# Patient Record
Sex: Female | Born: 1965 | Race: Black or African American | Hispanic: No | Marital: Married | Smoking: Never smoker
Health system: Southern US, Community
[De-identification: ages and names within clinical notes are randomized; demographics above are authoritative.]

## PROBLEM LIST (undated history)

## (undated) DIAGNOSIS — F419 Anxiety disorder, unspecified: Secondary | ICD-10-CM

## (undated) DIAGNOSIS — R001 Bradycardia, unspecified: Secondary | ICD-10-CM

## (undated) DIAGNOSIS — I471 Supraventricular tachycardia, unspecified: Secondary | ICD-10-CM

## (undated) DIAGNOSIS — R519 Headache, unspecified: Secondary | ICD-10-CM

## (undated) DIAGNOSIS — I429 Cardiomyopathy, unspecified: Secondary | ICD-10-CM

## (undated) DIAGNOSIS — I420 Dilated cardiomyopathy: Secondary | ICD-10-CM

## (undated) DIAGNOSIS — Z950 Presence of cardiac pacemaker: Secondary | ICD-10-CM

## (undated) DIAGNOSIS — I495 Sick sinus syndrome: Secondary | ICD-10-CM

## (undated) DIAGNOSIS — I447 Left bundle-branch block, unspecified: Secondary | ICD-10-CM

## (undated) DIAGNOSIS — F410 Panic disorder [episodic paroxysmal anxiety] without agoraphobia: Secondary | ICD-10-CM

## (undated) DIAGNOSIS — R51 Headache: Secondary | ICD-10-CM

## (undated) DIAGNOSIS — I491 Atrial premature depolarization: Secondary | ICD-10-CM

## (undated) DIAGNOSIS — I5020 Unspecified systolic (congestive) heart failure: Secondary | ICD-10-CM

## (undated) DIAGNOSIS — I10 Essential (primary) hypertension: Secondary | ICD-10-CM

## (undated) DIAGNOSIS — I509 Heart failure, unspecified: Secondary | ICD-10-CM

## (undated) HISTORY — DX: Supraventricular tachycardia, unspecified: I47.10

## (undated) HISTORY — DX: Bradycardia, unspecified: R00.1

## (undated) HISTORY — DX: Anxiety disorder, unspecified: F41.9

## (undated) HISTORY — DX: Essential (primary) hypertension: I10

## (undated) HISTORY — DX: Dilated cardiomyopathy: I42.0

## (undated) HISTORY — DX: Left bundle-branch block, unspecified: I44.7

## (undated) HISTORY — DX: Presence of cardiac pacemaker: Z95.0

## (undated) HISTORY — DX: Heart failure, unspecified: I50.9

## (undated) HISTORY — PX: TUBAL LIGATION: SHX77

## (undated) HISTORY — DX: Sick sinus syndrome: I49.5

## (undated) HISTORY — DX: Atrial premature depolarization: I49.1

## (undated) HISTORY — DX: Cardiomyopathy, unspecified: I42.9

## (undated) HISTORY — DX: Unspecified systolic (congestive) heart failure: I50.20

## (undated) HISTORY — DX: Supraventricular tachycardia: I47.1

## (undated) HISTORY — DX: Panic disorder (episodic paroxysmal anxiety): F41.0

---

## 2009-12-14 HISTORY — PX: CARDIAC CATHETERIZATION: SHX172

## 2009-12-26 HISTORY — PX: PACEMAKER INSERTION: SHX728

## 2010-10-14 HISTORY — PX: LEEP: SHX91

## 2012-04-15 ENCOUNTER — Other Ambulatory Visit (HOSPITAL_COMMUNITY)
Admission: RE | Admit: 2012-04-15 | Discharge: 2012-04-15 | Disposition: A | Discharge status: Home / Self Care | Payer: Managed Care, Other (non HMO) | Source: Ambulatory Visit | Attending: Obstetrics and Gynecology | Admitting: Obstetrics and Gynecology

## 2012-04-15 DIAGNOSIS — Z01419 Encounter for gynecological examination (general) (routine) without abnormal findings: Secondary | ICD-10-CM | POA: Insufficient documentation

## 2012-04-15 DIAGNOSIS — Z113 Encounter for screening for infections with a predominantly sexual mode of transmission: Secondary | ICD-10-CM | POA: Insufficient documentation

## 2012-04-26 ENCOUNTER — Other Ambulatory Visit: Payer: Self-pay | Admitting: Obstetrics and Gynecology

## 2012-04-26 DIAGNOSIS — Z1231 Encounter for screening mammogram for malignant neoplasm of breast: Secondary | ICD-10-CM

## 2012-05-16 ENCOUNTER — Ambulatory Visit
Admission: RE | Admit: 2012-05-16 | Discharge: 2012-05-16 | Disposition: A | Discharge status: Home / Self Care | Payer: Managed Care, Other (non HMO) | Source: Ambulatory Visit | Attending: Obstetrics and Gynecology | Admitting: Obstetrics and Gynecology

## 2012-05-16 DIAGNOSIS — Z1231 Encounter for screening mammogram for malignant neoplasm of breast: Secondary | ICD-10-CM

## 2013-05-03 ENCOUNTER — Other Ambulatory Visit (HOSPITAL_COMMUNITY)
Admission: RE | Admit: 2013-05-03 | Discharge: 2013-05-03 | Disposition: A | Discharge status: Home / Self Care | Payer: Commercial Indemnity | Source: Ambulatory Visit | Attending: Obstetrics and Gynecology | Admitting: Obstetrics and Gynecology

## 2013-05-03 ENCOUNTER — Other Ambulatory Visit: Payer: Self-pay | Admitting: Obstetrics and Gynecology

## 2013-05-03 DIAGNOSIS — Z1151 Encounter for screening for human papillomavirus (HPV): Secondary | ICD-10-CM | POA: Insufficient documentation

## 2013-05-03 DIAGNOSIS — Z01419 Encounter for gynecological examination (general) (routine) without abnormal findings: Secondary | ICD-10-CM | POA: Insufficient documentation

## 2013-11-16 ENCOUNTER — Telehealth: Payer: Self-pay | Admitting: Interventional Cardiology

## 2013-11-16 NOTE — Telephone Encounter (Signed)
Left message for patient that transmitter is set up and ready for remote 11/20/13.

## 2013-11-16 NOTE — Telephone Encounter (Signed)
New Problem:  Pt is calling in b/c she recently moved to a different apartment and she wants to know if everything is set up for her remote transsion that is scheduled for Monday. Pt is requesting a call back

## 2013-11-20 ENCOUNTER — Ambulatory Visit: Payer: Commercial Indemnity | Admitting: *Deleted

## 2013-11-20 ENCOUNTER — Encounter: Payer: Self-pay | Admitting: Internal Medicine

## 2013-11-20 DIAGNOSIS — I495 Sick sinus syndrome: Secondary | ICD-10-CM

## 2013-11-27 LAB — MDC_IDC_ENUM_SESS_TYPE_REMOTE
Battery Remaining Longevity: 52 mo
Implantable Pulse Generator Model: 3210
Lead Channel Impedance Value: 440 Ohm
Lead Channel Impedance Value: 580 Ohm
Lead Channel Pacing Threshold Pulse Width: 0.4 ms
Lead Channel Pacing Threshold Pulse Width: 0.4 ms

## 2013-12-01 ENCOUNTER — Encounter: Payer: Self-pay | Admitting: *Deleted

## 2013-12-19 ENCOUNTER — Encounter: Payer: Self-pay | Admitting: Internal Medicine

## 2013-12-19 ENCOUNTER — Ambulatory Visit: Payer: Commercial Indemnity | Admitting: *Deleted

## 2013-12-19 LAB — MDC_IDC_ENUM_SESS_TYPE_REMOTE
Implantable Pulse Generator Model: 3210
Implantable Pulse Generator Serial Number: 2383113
Lead Channel Impedance Value: 590 Ohm
Lead Channel Pacing Threshold Amplitude: 1.5 V
Lead Channel Pacing Threshold Pulse Width: 0.4 ms
Lead Channel Pacing Threshold Pulse Width: 0.4 ms
Lead Channel Setting Pacing Pulse Width: 0.4 ms
Lead Channel Setting Pacing Pulse Width: 0.4 ms
MDC IDC MSMT BATTERY VOLTAGE: 2.9 V
MDC IDC MSMT LEADCHNL LV PACING THRESHOLD AMPLITUDE: 1 V
MDC IDC MSMT LEADCHNL LV PACING THRESHOLD PULSEWIDTH: 0.4 ms
MDC IDC MSMT LEADCHNL RA IMPEDANCE VALUE: 450 Ohm
MDC IDC MSMT LEADCHNL RA PACING THRESHOLD AMPLITUDE: 0.625 V
MDC IDC MSMT LEADCHNL RA SENSING INTR AMPL: 2.9 mV
MDC IDC MSMT LEADCHNL RV IMPEDANCE VALUE: 380 Ohm
MDC IDC SET LEADCHNL LV PACING AMPLITUDE: 2 V
MDC IDC SET LEADCHNL RA PACING AMPLITUDE: 1.625
MDC IDC SET LEADCHNL RV PACING AMPLITUDE: 2.5 V
MDC IDC SET LEADCHNL RV SENSING SENSITIVITY: 2 mV
MDC IDC STAT BRADY RA PERCENT PACED: 8 %
MDC IDC STAT BRADY RV PERCENT PACED: 99 % — AB

## 2013-12-20 ENCOUNTER — Telehealth: Payer: Self-pay

## 2013-12-20 MED ORDER — NEBIVOLOL HCL 10 MG PO TABS: 20.0000 mg | ORAL_TABLET | Freq: Every day | ORAL | Status: DC

## 2013-12-20 NOTE — Telephone Encounter (Signed)
returned pt call.ok for pt to take 2 10mg  tablets daily per Dr.Nelson. RX sent to pt pharmacy.pt verbalized understanding.

## 2013-12-22 ENCOUNTER — Other Ambulatory Visit: Payer: Self-pay

## 2013-12-22 MED ORDER — NEBIVOLOL HCL 10 MG PO TABS: 20.0000 mg | ORAL_TABLET | Freq: Every day | ORAL | Status: DC

## 2013-12-27 ENCOUNTER — Other Ambulatory Visit: Payer: Self-pay

## 2013-12-27 DIAGNOSIS — Z1231 Encounter for screening mammogram for malignant neoplasm of breast: Secondary | ICD-10-CM

## 2014-01-09 ENCOUNTER — Encounter: Payer: Self-pay | Admitting: *Deleted

## 2014-01-19 ENCOUNTER — Ambulatory Visit
Admission: RE | Admit: 2014-01-19 | Discharge: 2014-01-19 | Disposition: A | Discharge status: Home / Self Care | Payer: Managed Care, Other (non HMO) | Source: Ambulatory Visit

## 2014-01-19 DIAGNOSIS — Z1231 Encounter for screening mammogram for malignant neoplasm of breast: Secondary | ICD-10-CM

## 2014-02-20 ENCOUNTER — Encounter: Payer: Commercial Indemnity | Admitting: Internal Medicine

## 2014-05-23 ENCOUNTER — Encounter: Payer: Self-pay | Admitting: Internal Medicine

## 2014-05-23 ENCOUNTER — Ambulatory Visit (INDEPENDENT_AMBULATORY_CARE_PROVIDER_SITE_OTHER): Payer: Managed Care, Other (non HMO) | Admitting: *Deleted

## 2014-05-23 DIAGNOSIS — I509 Heart failure, unspecified: Secondary | ICD-10-CM

## 2014-05-23 LAB — MDC_IDC_ENUM_SESS_TYPE_REMOTE
Battery Remaining Longevity: 56 mo
Brady Statistic AP VP Percent: 7 %
Brady Statistic AS VP Percent: 92 %
Brady Statistic AS VS Percent: 1 %
Brady Statistic RA Percent Paced: 6.9 %
Date Time Interrogation Session: 20150610060016
Implantable Pulse Generator Serial Number: 2383113
Lead Channel Impedance Value: 350 Ohm
Lead Channel Pacing Threshold Amplitude: 0.625 V
Lead Channel Pacing Threshold Pulse Width: 0.4 ms
Lead Channel Pacing Threshold Pulse Width: 0.4 ms
Lead Channel Sensing Intrinsic Amplitude: 1.7 mV
Lead Channel Sensing Intrinsic Amplitude: 12 mV
Lead Channel Setting Pacing Amplitude: 1.625
Lead Channel Setting Pacing Amplitude: 2.375
Lead Channel Setting Pacing Pulse Width: 0.4 ms
Lead Channel Setting Pacing Pulse Width: 0.4 ms
MDC IDC MSMT BATTERY REMAINING PERCENTAGE: 53 %
MDC IDC MSMT BATTERY VOLTAGE: 2.9 V
MDC IDC MSMT LEADCHNL LV IMPEDANCE VALUE: 580 Ohm
MDC IDC MSMT LEADCHNL LV PACING THRESHOLD AMPLITUDE: 1.125 V
MDC IDC MSMT LEADCHNL LV PACING THRESHOLD PULSEWIDTH: 0.4 ms
MDC IDC MSMT LEADCHNL RA IMPEDANCE VALUE: 490 Ohm
MDC IDC MSMT LEADCHNL RV PACING THRESHOLD AMPLITUDE: 1.375 V
MDC IDC SET LEADCHNL LV PACING AMPLITUDE: 2.125
MDC IDC SET LEADCHNL RV SENSING SENSITIVITY: 2 mV
MDC IDC STAT BRADY AP VS PERCENT: 1 %

## 2014-05-23 NOTE — Progress Notes (Signed)
Remote pacemaker transmission.   

## 2014-06-05 ENCOUNTER — Encounter: Payer: Self-pay | Admitting: Cardiology

## 2014-07-19 ENCOUNTER — Encounter: Payer: Managed Care, Other (non HMO) | Admitting: Internal Medicine

## 2014-08-01 ENCOUNTER — Encounter: Payer: Self-pay | Admitting: Internal Medicine

## 2014-08-16 ENCOUNTER — Ambulatory Visit (INDEPENDENT_AMBULATORY_CARE_PROVIDER_SITE_OTHER): Payer: Managed Care, Other (non HMO) | Admitting: Internal Medicine

## 2014-08-16 ENCOUNTER — Encounter: Payer: Self-pay | Admitting: Internal Medicine

## 2014-08-16 ENCOUNTER — Encounter: Payer: Self-pay | Admitting: Interventional Cardiology

## 2014-08-16 ENCOUNTER — Telehealth: Payer: Self-pay | Admitting: Interventional Cardiology

## 2014-08-16 ENCOUNTER — Ambulatory Visit (INDEPENDENT_AMBULATORY_CARE_PROVIDER_SITE_OTHER): Payer: Managed Care, Other (non HMO) | Admitting: Interventional Cardiology

## 2014-08-16 VITALS — BP 122/80 | HR 50 | Ht 66.0 in | Wt 197.0 lb

## 2014-08-16 VITALS — BP 136/80 | HR 55 | Ht 66.0 in | Wt 197.0 lb

## 2014-08-16 DIAGNOSIS — I5022 Chronic systolic (congestive) heart failure: Secondary | ICD-10-CM

## 2014-08-16 DIAGNOSIS — Z950 Presence of cardiac pacemaker: Secondary | ICD-10-CM

## 2014-08-16 DIAGNOSIS — I10 Essential (primary) hypertension: Secondary | ICD-10-CM

## 2014-08-16 LAB — MDC_IDC_ENUM_SESS_TYPE_INCLINIC
Battery Voltage: 2.9 V
Brady Statistic RA Percent Paced: 7.4 %
Implantable Pulse Generator Model: 3210
Lead Channel Impedance Value: 575 Ohm
Lead Channel Pacing Threshold Amplitude: 0.75 V
Lead Channel Pacing Threshold Amplitude: 1.625 V
Lead Channel Pacing Threshold Pulse Width: 0.4 ms
Lead Channel Sensing Intrinsic Amplitude: 12 mV
Lead Channel Setting Pacing Amplitude: 1.75 V
Lead Channel Setting Pacing Amplitude: 2 V
Lead Channel Setting Pacing Amplitude: 2.625
Lead Channel Setting Pacing Pulse Width: 0.4 ms
Lead Channel Setting Sensing Sensitivity: 2 mV
MDC IDC MSMT BATTERY REMAINING LONGEVITY: 62.4 mo
MDC IDC MSMT LEADCHNL LV PACING THRESHOLD AMPLITUDE: 1 V
MDC IDC MSMT LEADCHNL LV PACING THRESHOLD PULSEWIDTH: 0.4 ms
MDC IDC MSMT LEADCHNL RA IMPEDANCE VALUE: 462.5 Ohm
MDC IDC MSMT LEADCHNL RA SENSING INTR AMPL: 1 mV
MDC IDC MSMT LEADCHNL RV IMPEDANCE VALUE: 337.5 Ohm
MDC IDC MSMT LEADCHNL RV PACING THRESHOLD PULSEWIDTH: 0.4 ms
MDC IDC PG SERIAL: 2383113
MDC IDC SESS DTM: 20150903162315
MDC IDC SET LEADCHNL LV PACING PULSEWIDTH: 0.4 ms
MDC IDC STAT BRADY RV PERCENT PACED: 99.34 %

## 2014-08-16 NOTE — Progress Notes (Signed)
Patient ID: Alexis Clay, female   DOB: 06-28-1966, 48 y.o.   MRN: [id number]    [address] [place], [place]  [address] Phone: [phone] Fax:  [phone]  Date:  08/16/2014   ID:  Alexis Clay, DOB January 22, 1966, MRN [id number]  PCP:  No primary provider on file.   ASSESSMENT:  1. 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 Biventricular pacing 3. Hypertension, controlled 4. Left bundle branch block  PLAN:  1. Clinical followup 6-9 months 2. No change in current medical regimen 3. at her next visit we will need to reassess LV function   SUBJECTIVE: Alexis Clay is a 48 y.o. female frustrated because she has been on office for several hours today. She saw Dr. Ladona Ridgel earlier today. She was supposed to see me in 3:30 but was still an EP until 4:30. And I didn't see her until around 5:30. She is asymptomatic. She denies orthopnea, PND, palpitations, syncope, edema, and exertional intolerance. She denies chest pain. No medication side effects. Nebivolol/Bystolic was discontinued last year when the medication was not available and the atenolol was started. She does not want to use a different beta blocker because of the affordability.   Wt Readings from Last 3 Encounters:  08/16/14 197 lb (89.359 kg)  08/16/14 197 lb (89.359 kg)     Past Medical History  Diagnosis Date  . Cardiomyopathy   . Systolic heart failure     Due to hypertension (LVEF 35-55% after med therapy & resynchronizatoin)  . Hypertension, benign   . LBBB (left bundle branch block)   . Biventricular cardiac pacemaker in situ   . SVT (supraventricular tachycardia)   . Sinus node dysfunction   . Anxiety disorder   . Bradycardia   . Panic disorder without agoraphobia   . Supraventricular premature beats   . Sinoatrial node dysfunction   . Dilated cardiomyopathy   . CHF (congestive heart failure)     Current Outpatient Prescriptions    Medication Sig Dispense Refill  . aspirin 81 MG tablet Take 81 mg by mouth daily.      Marland Kitchen atenolol (TENORMIN) 50 MG tablet Take 50 mg by mouth daily.       Marland Kitchen lisinopril-hydrochlorothiazide (PRINZIDE,ZESTORETIC) 20-25 MG per tablet 1 tablet daily.        No current facility-administered medications for this visit.    Allergies:   No Known Allergies  Social History:  The patient  reports that she has never smoked. She does not have any smokeless tobacco history on file. She reports that she drinks alcohol. She reports that she does not use illicit drugs.   ROS:  Please see the history of present illness.   Denies edema. No neurological complaints. No prolonged palpitations.    All other systems reviewed and negative.   OBJECTIVE: VS:  BP 122/80  Pulse 50  Ht  (1.676 m)  Wt 197 lb (89.359 kg)  BMI 31.81 kg/m2 Well nourished, well developed, in no acute distress, occlusion of his stated age  HEENT: normal Neck: JVD flat . Carotid bruit absent   Cardiac:  normal S1, S2; RRR; no murmur Lungs:  clear to auscultation bilaterally, no wheezing, rhonchi or rales Abd: soft, nontender, no hepatomegaly Ext: Edema absent . Pulses 2+  Skin: warm and dry Neuro:  CNs 2-12 intact, no focal abnormalities noted  EKG:  Atrial tracking with ventricular pacing.       Signed, Barry Dienes. B.  Leia Alf, MD 08/16/2014 5:24 PM    [facility] PROBLEM LIST: 1. Prior systolic heart failure with EF 35% improving to 55% no medication therapy and after resynchronization 2. Hypertension 3. PSVT 4. Sinus node dysfunction 5. Absence of coronary disease by catheterization 2011 6. Left bundle branch block 7. Biventricular pacer 2011 patient also has an atrial lead *. Anxiety disorder

## 2014-08-16 NOTE — Progress Notes (Signed)
HPI [name] is referred today for ongoing evaluation and management of her biventricular pacemaker. She is a very pleasant 48 year old woman who is moved from [place]. She has a history of hypertension and hypertensive heart disease, and left bundle branch block, who underwent biventricular pacemaker insertion approximately 4 years ago. She has done well in the interim. Her symptoms are really class I, may be class IIA. She does not have angina, and denies peripheral edema. No syncope. No Known Allergies   Current Outpatient Prescriptions  Medication Sig Dispense Refill  . aspirin 81 MG tablet Take 81 mg by mouth daily.      Marland Kitchen atenolol (TENORMIN) 50 MG tablet Take 50 mg by mouth daily.       Marland Kitchen lisinopril-hydrochlorothiazide (PRINZIDE,ZESTORETIC) 20-25 MG per tablet 1 tablet daily.        No current facility-administered medications for this visit.     Past Medical History  Diagnosis Date  . Cardiomyopathy   . Systolic heart failure     Due to hypertension (LVEF 35-55% after med therapy & resynchronizatoin)  . Hypertension, benign   . LBBB (left bundle branch block)   . Biventricular cardiac pacemaker in situ   . SVT (supraventricular tachycardia)   . Sinus node dysfunction   . Anxiety disorder   . Bradycardia   . Panic disorder without agoraphobia   . Supraventricular premature beats   . Sinoatrial node dysfunction   . Dilated cardiomyopathy   . CHF (congestive heart failure)     ROS:   All systems reviewed and negative except as noted in the HPI.   Past Surgical History  Procedure Laterality Date  . Cardiac catheterization  2011    With widely patent coronary arteries   . Cesarean section  1991  . Leep  10/2010  . Pacemaker insertion  12/26/2009    St. Jude BiV PM.  Model # [id number].  Serial # [id number]       Family History  Problem Relation Age of Onset  . Cancer Mother     cervical  . Heart disease Mother     CABGx3 in 2014  . CAD Father   .  Congestive Heart Failure Father   . Hypertension Father   . Renal Disease Father     CKD, ESRD  . Hypertension Brother   . Hypertension Brother   . Hypertension Sister      History   Social History  . Marital Status: Legally Separated    Spouse Name: N/A    Number of Children: N/A  . Years of Education: N/A   Occupational History  . Not on file.   Social History Main Topics  . Smoking status: Never Smoker   . Smokeless tobacco: Not on file  . Alcohol Use: Yes     Comment: occasionally  . Drug Use: No  . Sexual Activity: Not on file   Other Topics Concern  . Not on file   Social History Narrative  . No narrative on file     BP 136/80  Pulse 55  Ht  (1.676 m)  Wt 197 lb (89.359 kg)  BMI 31.81 kg/m2  Physical Exam:  Well appearing 48 year old woman, NAD HEENT: Unremarkable Neck:  No JVD, no thyromegally Back:  No CVA tenderness Lungs:  Clear with no wheezes, rales, or rhonchi. Well-healed pacemaker incision. HEART:  Regular rate rhythm, no murmurs, no rubs, no clicks Abd:  soft, positive bowel sounds, no organomegally, no  rebound, no guarding Ext:  2 plus pulses, no edema, no cyanosis, no clubbing Skin:  No rashes no nodules Neuro:  CN II through XII intact, motor grossly intact  EKG - normal sinus rhythm, with biventricular pacing.  DEVICE  Normal device function.  See PaceArt for details.   Assess/Plan:

## 2014-08-16 NOTE — Assessment & Plan Note (Signed)
Her chronic systolic heart failure appears to be well compensated. No change in medical therapy today. I will defer additional echocardiography, and heart failure medication changes to her primary cardiologist Dr. Katrinka Blazing.

## 2014-08-16 NOTE — Patient Instructions (Signed)
Your physician recommends that you continue on your current medications as directed. Please refer to the Current Medication list given to you today.  Your physician wants you to follow-up in: 6-9 months with Dr.Smith You will receive a reminder letter in the mail two months in advance. If you don't receive a letter, please call our office to schedule the follow-up appointment.  

## 2014-08-16 NOTE — Patient Instructions (Signed)
Remote monitoring is used to monitor your Pacemaker of ICD from home. This monitoring reduces the number of office visits required to check your device to one time per year. It allows [place] to keep an eye on the functioning of your device to ensure it is working properly. You are scheduled for a device check from home on 11/19/14. You may send your transmission at any time that day. If you have a wireless device, the transmission will be sent automatically. After your physician reviews your transmission, you will receive a postcard with your next transmission date.  Your physician wants you to follow-up in: 1 year with Dr. Ladona Ridgel.   You will receive a reminder letter in the mail two months in advance. If you don't receive a letter, please call our office to schedule the follow-up appointment.

## 2014-08-16 NOTE — Assessment & Plan Note (Signed)
Her blood pressure appears to be well-controlled. She will continue her current medications, and is encouraged to maintain a low-sodium diet.

## 2014-08-16 NOTE — Assessment & Plan Note (Signed)
Her St. Jude biventricular pacemaker is working normally. We'll plan to recheck in several months. 

## 2014-08-17 NOTE — Telephone Encounter (Signed)
Error

## 2014-08-20 ENCOUNTER — Encounter: Payer: Self-pay | Admitting: Internal Medicine

## 2014-08-22 ENCOUNTER — Encounter: Payer: Self-pay | Admitting: Internal Medicine

## 2014-11-19 ENCOUNTER — Ambulatory Visit (INDEPENDENT_AMBULATORY_CARE_PROVIDER_SITE_OTHER): Payer: Managed Care, Other (non HMO) | Admitting: *Deleted

## 2014-11-19 ENCOUNTER — Encounter: Payer: Self-pay | Admitting: Internal Medicine

## 2014-11-19 DIAGNOSIS — Z950 Presence of cardiac pacemaker: Secondary | ICD-10-CM

## 2014-11-19 DIAGNOSIS — I5022 Chronic systolic (congestive) heart failure: Secondary | ICD-10-CM

## 2014-11-19 NOTE — Progress Notes (Signed)
Remote pacemaker transmission.   

## 2014-11-24 LAB — MDC_IDC_ENUM_SESS_TYPE_REMOTE
Battery Remaining Longevity: 50 mo
Battery Remaining Percentage: 48 %
Battery Voltage: 2.89 V
Brady Statistic AP VS Percent: 1 %
Brady Statistic RA Percent Paced: 6 %
Implantable Pulse Generator Model: 3210
Lead Channel Impedance Value: 490 Ohm
Lead Channel Pacing Threshold Amplitude: 1 V
Lead Channel Pacing Threshold Amplitude: 1.5 V
Lead Channel Pacing Threshold Pulse Width: 0.4 ms
Lead Channel Pacing Threshold Pulse Width: 0.4 ms
Lead Channel Sensing Intrinsic Amplitude: 1.1 mV
Lead Channel Setting Pacing Amplitude: 1.75 V
Lead Channel Setting Pacing Amplitude: 2 V
Lead Channel Setting Pacing Pulse Width: 0.4 ms
Lead Channel Setting Pacing Pulse Width: 0.4 ms
Lead Channel Setting Sensing Sensitivity: 2 mV
MDC IDC MSMT LEADCHNL LV IMPEDANCE VALUE: 550 Ohm
MDC IDC MSMT LEADCHNL RA PACING THRESHOLD AMPLITUDE: 0.75 V
MDC IDC MSMT LEADCHNL RV IMPEDANCE VALUE: 330 Ohm
MDC IDC MSMT LEADCHNL RV PACING THRESHOLD PULSEWIDTH: 0.4 ms
MDC IDC PG SERIAL: 2383113
MDC IDC SESS DTM: 20151207070822
MDC IDC SET LEADCHNL RV PACING AMPLITUDE: 2.5 V
MDC IDC STAT BRADY AP VP PERCENT: 6.2 %
MDC IDC STAT BRADY AS VP PERCENT: 94 %
MDC IDC STAT BRADY AS VS PERCENT: 1 %

## 2014-11-30 ENCOUNTER — Encounter: Payer: Self-pay | Admitting: Cardiology

## 2015-02-14 ENCOUNTER — Other Ambulatory Visit: Payer: Self-pay

## 2015-02-14 DIAGNOSIS — Z1231 Encounter for screening mammogram for malignant neoplasm of breast: Secondary | ICD-10-CM

## 2015-02-18 ENCOUNTER — Ambulatory Visit (INDEPENDENT_AMBULATORY_CARE_PROVIDER_SITE_OTHER): Payer: Managed Care, Other (non HMO) | Admitting: *Deleted

## 2015-02-18 DIAGNOSIS — Z950 Presence of cardiac pacemaker: Secondary | ICD-10-CM

## 2015-02-18 LAB — MDC_IDC_ENUM_SESS_TYPE_REMOTE
Battery Remaining Longevity: 44 mo
Battery Remaining Percentage: 43 %
Brady Statistic AP VP Percent: 5.1 %
Brady Statistic RA Percent Paced: 4.9 %
Date Time Interrogation Session: 20160307074305
Implantable Pulse Generator Model: 3210
Implantable Pulse Generator Serial Number: 2383113
Lead Channel Impedance Value: 510 Ohm
Lead Channel Impedance Value: 580 Ohm
Lead Channel Pacing Threshold Amplitude: 1.125 V
Lead Channel Pacing Threshold Amplitude: 1.375 V
Lead Channel Pacing Threshold Pulse Width: 0.4 ms
Lead Channel Pacing Threshold Pulse Width: 0.4 ms
Lead Channel Sensing Intrinsic Amplitude: 12 mV
Lead Channel Setting Pacing Amplitude: 1.75 V
Lead Channel Setting Pacing Pulse Width: 0.4 ms
Lead Channel Setting Sensing Sensitivity: 2 mV
MDC IDC MSMT BATTERY VOLTAGE: 2.87 V
MDC IDC MSMT LEADCHNL RA PACING THRESHOLD AMPLITUDE: 0.75 V
MDC IDC MSMT LEADCHNL RA PACING THRESHOLD PULSEWIDTH: 0.4 ms
MDC IDC MSMT LEADCHNL RA SENSING INTR AMPL: 2.4 mV
MDC IDC MSMT LEADCHNL RV IMPEDANCE VALUE: 300 Ohm
MDC IDC SET LEADCHNL LV PACING AMPLITUDE: 2.125
MDC IDC SET LEADCHNL RV PACING AMPLITUDE: 2.375
MDC IDC SET LEADCHNL RV PACING PULSEWIDTH: 0.4 ms
MDC IDC STAT BRADY AP VS PERCENT: 1 %
MDC IDC STAT BRADY AS VP PERCENT: 95 %
MDC IDC STAT BRADY AS VS PERCENT: 1 %

## 2015-02-18 NOTE — Progress Notes (Signed)
Remote pacemaker transmission.   

## 2015-02-20 ENCOUNTER — Ambulatory Visit
Admission: RE | Admit: 2015-02-20 | Discharge: 2015-02-20 | Disposition: A | Discharge status: Home / Self Care | Payer: Managed Care, Other (non HMO) | Source: Ambulatory Visit

## 2015-02-20 DIAGNOSIS — Z1231 Encounter for screening mammogram for malignant neoplasm of breast: Secondary | ICD-10-CM

## 2015-02-28 ENCOUNTER — Encounter: Payer: Self-pay | Admitting: Cardiology

## 2015-03-06 ENCOUNTER — Encounter: Payer: Self-pay | Admitting: Internal Medicine

## 2015-05-20 ENCOUNTER — Ambulatory Visit (INDEPENDENT_AMBULATORY_CARE_PROVIDER_SITE_OTHER): Payer: Self-pay | Admitting: *Deleted

## 2015-05-20 DIAGNOSIS — I5022 Chronic systolic (congestive) heart failure: Secondary | ICD-10-CM

## 2015-05-20 DIAGNOSIS — Z950 Presence of cardiac pacemaker: Secondary | ICD-10-CM

## 2015-05-20 NOTE — Progress Notes (Signed)
Remote pacemaker transmission.   

## 2015-05-23 LAB — CUP PACEART REMOTE DEVICE CHECK
Battery Remaining Longevity: 39 mo
Battery Remaining Percentage: 38 %
Battery Voltage: 2.86 V
Brady Statistic AS VP Percent: 95 %
Brady Statistic AS VS Percent: 1 %
Brady Statistic RA Percent Paced: 4.7 %
Lead Channel Impedance Value: 490 Ohm
Lead Channel Impedance Value: 610 Ohm
Lead Channel Pacing Threshold Amplitude: 0.75 V
Lead Channel Pacing Threshold Amplitude: 1.25 V
Lead Channel Pacing Threshold Amplitude: 1.375 V
Lead Channel Pacing Threshold Pulse Width: 0.4 ms
Lead Channel Pacing Threshold Pulse Width: 0.4 ms
Lead Channel Sensing Intrinsic Amplitude: 1.5 mV
Lead Channel Sensing Intrinsic Amplitude: 12 mV
Lead Channel Setting Pacing Amplitude: 1.75 V
Lead Channel Setting Pacing Amplitude: 2.375
Lead Channel Setting Pacing Pulse Width: 0.4 ms
Lead Channel Setting Pacing Pulse Width: 0.4 ms
MDC IDC MSMT LEADCHNL RV IMPEDANCE VALUE: 300 Ohm
MDC IDC MSMT LEADCHNL RV PACING THRESHOLD PULSEWIDTH: 0.4 ms
MDC IDC SESS DTM: 20160606061302
MDC IDC SET LEADCHNL LV PACING AMPLITUDE: 2.25 V
MDC IDC SET LEADCHNL RV SENSING SENSITIVITY: 2 mV
MDC IDC STAT BRADY AP VP PERCENT: 4.9 %
MDC IDC STAT BRADY AP VS PERCENT: 1 %
Pulse Gen Model: 3210
Pulse Gen Serial Number: 2383113

## 2015-06-03 ENCOUNTER — Encounter: Payer: Self-pay | Admitting: Cardiology

## 2015-06-05 ENCOUNTER — Encounter: Payer: Self-pay | Admitting: Internal Medicine

## 2015-07-19 ENCOUNTER — Other Ambulatory Visit: Payer: Self-pay | Admitting: Interventional Cardiology

## 2015-07-19 NOTE — Telephone Encounter (Signed)
Please advise on refill. I do not see where Dr Katrinka Blazing has ever refilled this for the patient. Thanks, MI

## 2015-09-03 ENCOUNTER — Encounter: Payer: Self-pay | Admitting: Internal Medicine

## 2015-09-03 ENCOUNTER — Ambulatory Visit (INDEPENDENT_AMBULATORY_CARE_PROVIDER_SITE_OTHER): Payer: BLUE CROSS/BLUE SHIELD | Admitting: Internal Medicine

## 2015-09-03 VITALS — BP 118/78 | HR 50 | Ht 66.0 in | Wt 205.2 lb

## 2015-09-03 DIAGNOSIS — I10 Essential (primary) hypertension: Secondary | ICD-10-CM

## 2015-09-03 DIAGNOSIS — Z950 Presence of cardiac pacemaker: Secondary | ICD-10-CM

## 2015-09-03 DIAGNOSIS — I5022 Chronic systolic (congestive) heart failure: Secondary | ICD-10-CM | POA: Diagnosis not present

## 2015-09-03 LAB — CUP PACEART INCLINIC DEVICE CHECK
Battery Voltage: 2.86 V
Date Time Interrogation Session: 20160920111932
Lead Channel Impedance Value: 287.5 Ohm
Lead Channel Impedance Value: 600 Ohm
Lead Channel Pacing Threshold Amplitude: 0.75 V
Lead Channel Pacing Threshold Amplitude: 1 V
Lead Channel Pacing Threshold Amplitude: 1 V
Lead Channel Pacing Threshold Pulse Width: 0.4 ms
Lead Channel Pacing Threshold Pulse Width: 0.4 ms
Lead Channel Pacing Threshold Pulse Width: 0.4 ms
Lead Channel Pacing Threshold Pulse Width: 0.4 ms
Lead Channel Pacing Threshold Pulse Width: 0.4 ms
Lead Channel Sensing Intrinsic Amplitude: 1.2 mV
Lead Channel Setting Pacing Amplitude: 1.625
Lead Channel Setting Pacing Amplitude: 2 V
Lead Channel Setting Pacing Pulse Width: 0.4 ms
Lead Channel Setting Sensing Sensitivity: 2 mV
MDC IDC MSMT BATTERY REMAINING LONGEVITY: 43.2 mo
MDC IDC MSMT LEADCHNL RA IMPEDANCE VALUE: 537.5 Ohm
MDC IDC MSMT LEADCHNL RA PACING THRESHOLD AMPLITUDE: 0.75 V
MDC IDC MSMT LEADCHNL RV PACING THRESHOLD AMPLITUDE: 1.5 V
MDC IDC MSMT LEADCHNL RV PACING THRESHOLD AMPLITUDE: 1.5 V
MDC IDC MSMT LEADCHNL RV PACING THRESHOLD PULSEWIDTH: 0.4 ms
MDC IDC MSMT LEADCHNL RV SENSING INTR AMPL: 12 mV
MDC IDC SET LEADCHNL RV PACING AMPLITUDE: 2.125
MDC IDC SET LEADCHNL RV PACING PULSEWIDTH: 0.4 ms
MDC IDC STAT BRADY RA PERCENT PACED: 4.6 %
MDC IDC STAT BRADY RV PERCENT PACED: 99.59 %
Pulse Gen Model: 3210
Pulse Gen Serial Number: 2383113

## 2015-09-03 NOTE — Patient Instructions (Signed)
Medication Instructions:  Your physician recommends that you continue on your current medications as directed. Please refer to the Current Medication list given to you today.   Labwork: None ordered  Testing/Procedures: None ordered  Follow-Up: Your physician wants you to follow-up in:  12 months with Dr Taylor You will receive a reminder letter in the mail two months in advance. If you don't receive a letter, please call our office to schedule the follow-up appointment.  Remote monitoring is used to monitor your Pacemaker  from home. This monitoring reduces the number of office visits required to check your device to one time per year. It allows us to keep an eye on the functioning of your device to ensure it is working properly. You are scheduled for a device check from home on 12/03/15. You may send your transmission at any time that day. If you have a wireless device, the transmission will be sent automatically. After your physician reviews your transmission, you will receive a postcard with your next transmission date.     Any Other Special Instructions Will Be Listed Below (If Applicable).   

## 2015-09-03 NOTE — Assessment & Plan Note (Signed)
Her blood pressure is well controlled. She will continue her current meds.  

## 2015-09-03 NOTE — Assessment & Plan Note (Signed)
Her symptoms are class 1. She will continue her current meds.  

## 2015-09-03 NOTE — Progress Notes (Signed)
HPI [name] is referred today for ongoing evaluation and management of her biventricular pacemaker. She is a very pleasant 49 year old woman who is moved from [place]. She has a history of hypertension and hypertensive heart disease, and left bundle branch block, who underwent biventricular pacemaker insertion approximately 4 years ago. She has done well in the interim. Her symptoms are really class I, may be class IIA. She does not have angina, and denies peripheral edema. No syncope. No palpitations. No Known Allergies   Current Outpatient Prescriptions  Medication Sig Dispense Refill  . aspirin 81 MG tablet Take 81 mg by mouth daily.    Marland Kitchen atenolol (TENORMIN) 50 MG tablet Take 50 mg by mouth daily.     Marland Kitchen lisinopril-hydrochlorothiazide (PRINZIDE,ZESTORETIC) 20-25 MG per tablet TAKE 1 TABLET BY MOUTH EVERY DAY 30 tablet 2   No current facility-administered medications for this visit.     Past Medical History  Diagnosis Date  . Cardiomyopathy   . Systolic heart failure     Due to hypertension (LVEF 35-55% after med therapy & resynchronizatoin)  . Hypertension, benign   . LBBB (left bundle branch block)   . Biventricular cardiac pacemaker in situ   . SVT (supraventricular tachycardia)   . Sinus node dysfunction   . Anxiety disorder   . Bradycardia   . Panic disorder without agoraphobia   . Supraventricular premature beats   . Sinoatrial node dysfunction   . Dilated cardiomyopathy   . CHF (congestive heart failure)     ROS:   All systems reviewed and negative except as noted in the HPI.   Past Surgical History  Procedure Laterality Date  . Cardiac catheterization  2011    With widely patent coronary arteries   . Cesarean section  1991  . Leep  10/2010  . Pacemaker insertion  12/26/2009    St. Jude BiV PM.  Model # [id number].  Serial # [id number]       Family History  Problem Relation Age of Onset  . Cancer Mother     cervical  . Heart disease Mother    CABGx3 in 2014  . CAD Father   . Congestive Heart Failure Father   . Hypertension Father   . Renal Disease Father     CKD, ESRD  . Hypertension Brother   . Hypertension Brother   . Hypertension Sister      Social History   Social History  . Marital Status: Legally Separated    Spouse Name: N/A  . Number of Children: N/A  . Years of Education: N/A   Occupational History  . Not on file.   Social History Main Topics  . Smoking status: Never Smoker   . Smokeless tobacco: Not on file  . Alcohol Use: Yes     Comment: occasionally  . Drug Use: No  . Sexual Activity: Not on file   Other Topics Concern  . Not on file   Social History Narrative     BP 118/78 mmHg  Pulse 50  Ht  (1.676 m)  Wt 205 lb 3.2 oz (93.078 kg)  BMI 33.14 kg/m2  LMP 08/15/2015 (Exact Date)  Physical Exam:  Well appearing 49 year old woman, NAD HEENT: Unremarkable Neck:  6 cm JVD, no thyromegally Back:  No CVA tenderness Lungs:  Clear with no wheezes, rales, or rhonchi. Well-healed pacemaker incision. HEART:  Regular rate rhythm, no murmurs, no rubs, no clicks Abd:  soft, positive bowel sounds, no organomegally,  no rebound, no guarding Ext:  2 plus pulses, no edema, no cyanosis, no clubbing Skin:  No rashes no nodules Neuro:  CN II through XII intact, motor grossly intact  EKG - normal sinus rhythm, with biventricular pacing.  DEVICE  Normal device function.  See PaceArt for details.   Assess/Plan:

## 2015-09-03 NOTE — Assessment & Plan Note (Signed)
Her St. Jude device is working normally. Will recheck in several months. 

## 2015-11-13 ENCOUNTER — Encounter: Payer: Self-pay | Admitting: Interventional Cardiology

## 2015-11-13 ENCOUNTER — Ambulatory Visit (INDEPENDENT_AMBULATORY_CARE_PROVIDER_SITE_OTHER): Payer: BLUE CROSS/BLUE SHIELD | Admitting: Interventional Cardiology

## 2015-11-13 VITALS — BP 140/82 | HR 65 | Ht 66.0 in | Wt 213.8 lb

## 2015-11-13 DIAGNOSIS — I10 Essential (primary) hypertension: Secondary | ICD-10-CM

## 2015-11-13 DIAGNOSIS — R0683 Snoring: Secondary | ICD-10-CM

## 2015-11-13 DIAGNOSIS — I5022 Chronic systolic (congestive) heart failure: Secondary | ICD-10-CM

## 2015-11-13 DIAGNOSIS — Z950 Presence of cardiac pacemaker: Secondary | ICD-10-CM | POA: Diagnosis not present

## 2015-11-13 MED ORDER — METOPROLOL SUCCINATE ER 100 MG PO TB24: 100.0000 mg | ORAL_TABLET | Freq: Every day | ORAL | Status: DC

## 2015-11-13 NOTE — Progress Notes (Signed)
Cardiology Office Note   Date:  11/13/2015   ID:  Alexis Clay, DOB 27-Jan-1966, MRN [id number]  PCP:  No PCP Per Patient  Cardiologist:  Lesleigh Noe, MD   Chief Complaint  Patient presents with  . Congestive Heart Failure      History of Present Illness: Alexis Clay is a 49 y.o. female who presents for idiopathic cardiomyopathy with prior severe systolic dysfunction with EF of 35% improved to 55%, hypertension, left bundle branch block, history of PSVT, sinus node dysfunction, and mild obesity.  She denies cardiopulmonary complaints. No chest pain, tachycardia, or syncope. No medication side effects. She was previously on other beta blockers but for some reason was switched to atenolol years ago and therapy has not been redirected to guideline approved therapy. She denies orthopnea, PND, and edema. She is not exercising.    Past Medical History  Diagnosis Date  . Cardiomyopathy (HCC)   . Systolic heart failure (HCC)     Due to hypertension (LVEF 35-55% after med therapy & resynchronizatoin)  . Hypertension, benign   . LBBB (left bundle branch block)   . Biventricular cardiac pacemaker in situ   . SVT (supraventricular tachycardia) (HCC)   . Sinus node dysfunction (HCC)   . Anxiety disorder   . Bradycardia   . Panic disorder without agoraphobia   . Supraventricular premature beats   . Sinoatrial node dysfunction (HCC)   . Dilated cardiomyopathy (HCC)   . CHF (congestive heart failure) [facility])     Past Surgical History  Procedure Laterality Date  . Cardiac catheterization  2011    With widely patent coronary arteries   . Cesarean section  1991  . Leep  10/2010  . Pacemaker insertion  12/26/2009    St. Jude BiV PM.  Model # [id number].  Serial # [id number]       Current Outpatient Prescriptions  Medication Sig Dispense Refill  . aspirin 81 MG tablet Take 81 mg by mouth daily.    Marland Kitchen atenolol (TENORMIN) 50 MG tablet Take 50 mg by mouth daily.     Marland Kitchen  lisinopril-hydrochlorothiazide (PRINZIDE,ZESTORETIC) 20-25 MG per tablet TAKE 1 TABLET BY MOUTH EVERY DAY 30 tablet 2   No current facility-administered medications for this visit.    Allergies:   Review of patient's allergies indicates no known allergies.    Social History:  The patient  reports that she has never smoked. She does not have any smokeless tobacco history on file. She reports that she drinks alcohol. She reports that she does not use illicit drugs.   Family History:  The patient's family history includes CAD in her father; Cancer in her mother; Congestive Heart Failure in her father; Heart disease in her mother; Hypertension in her brother, brother, father, and sister; Renal Disease in her father.    ROS:  Please see the history of present illness.   Otherwise, review of systems are positive for loud snoring and complaints from family especially over the recent holiday season. Excessive daytime fatigue..   All other systems are reviewed and negative.    PHYSICAL EXAM: VS:  BP 140/82 mmHg  Pulse 65  Ht  (1.676 m)  Wt 213 lb 12.8 oz (96.979 kg)  BMI 34.52 kg/m2  SpO2 98% , BMI Body mass index is 34.52 kg/(m^2). GEN: Well nourished, well developed, in no acute distress HEENT: normal Neck: no JVD, carotid bruits, or masses Cardiac: RRR.  There is no murmur, rub, or gallop. There is  no edema. Respiratory:  clear to auscultation bilaterally, normal work of breathing. GI: soft, nontender, nondistended, + BS MS: no deformity or atrophy Skin: warm and dry, no rash Neuro:  Strength and sensation are intact Psych: euthymic mood, full affect   EKG:  EKG is not ordered today.    Recent Labs: No results found for requested labs within last 365 days.    Lipid Panel No results found for: CHOL, TRIG, HDL, CHOLHDL, VLDL, LDLCALC, LDLDIRECT    Wt Readings from Last 3 Encounters:  11/13/15 213 lb 12.8 oz (96.979 kg)  09/03/15 205 lb 3.2 oz (93.078 kg)  08/16/14 197  lb (89.359 kg)      Other studies Reviewed: Additional studies/ records that were reviewed today include: none. The findings include none. We have never done blood work in this system..    ASSESSMENT AND PLAN:  1. Biventricular cardiac pacemaker in situ Recent evaluation without evidence of dysfunction  2. Chronic systolic heart failure (HCC) Idiopathic chronic systolic heart failure with recovery of LV function by echo back to greater than 50% EF. No recent study  3. Essential hypertension Mild blood pressure elevation  4. Snoring Rule out sleep apnea.  Current medicines are reviewed at length with the patient today.  The patient has the following concerns regarding medicines: Atenolol is not a guideline mandated beta blocker and heart failure..  The following changes/actions have been instituted:    Change atenolol to metoprolol succinate 100 mg daily  2 Doppler echocardiogram  Sleep study  Increase aerobic activity  Basic metabolic panel  Labs/ tests ordered today include:  No orders of the defined types were placed in this encounter.     Disposition:   FU with HS in 1 year  Signed, Lesleigh NoeSMITH III,Miri Jose W, MD  11/13/2015 8:12 AM    [facility] [address], [place], [place]  [address] Phone: [phone]; Fax: [phone]

## 2015-11-13 NOTE — Patient Instructions (Signed)
Medication Instructions:  Your physician has recommended you make the following change in your medication:  1) STOP Atenolol 2) START Metoprolol 100mg  daily. An Rx has been sent to your pharmacy   Labwork: Your physician recommends that you return for lab work in: 2-4 weeks ([facility])   Testing/Procedures: Your physician has requested that you have an echocardiogram. Echocardiography is a painless test that uses sound waves to create images of your heart. It provides your doctor with information about the size and shape of your heart and how well your heart's chambers and valves are working. This procedure takes approximately one hour. There are no restrictions for this procedure.  Your physician has recommended that you have a sleep study. This test records several body functions during sleep, including: brain activity, eye movement, oxygen and carbon dioxide blood levels, heart rate and rhythm, breathing rate and rhythm, the flow of air through your mouth and nose, snoring, body muscle movements, and chest and belly movement.    Follow-Up: Your physician recommends that you schedule a follow-up appointment in: 2-4 weeks with the Blood Pressure clinic  Your physician wants you to follow-up in: 1 year with Dr.Smith You will receive a reminder letter in the mail two months in advance. If you don't receive a letter, please call our office to schedule the follow-up appointment.    Any Other Special Instructions Will Be Listed Below (If Applicable).     If you need a refill on your cardiac medications before your next appointment, please call your pharmacy.

## 2015-11-29 ENCOUNTER — Ambulatory Visit: Payer: BLUE CROSS/BLUE SHIELD | Admitting: Pharmacist

## 2015-11-29 ENCOUNTER — Other Ambulatory Visit: Payer: BLUE CROSS/BLUE SHIELD

## 2015-11-29 ENCOUNTER — Other Ambulatory Visit (HOSPITAL_COMMUNITY): Payer: BLUE CROSS/BLUE SHIELD

## 2015-12-01 ENCOUNTER — Other Ambulatory Visit: Payer: Self-pay | Admitting: Interventional Cardiology

## 2015-12-03 ENCOUNTER — Ambulatory Visit (INDEPENDENT_AMBULATORY_CARE_PROVIDER_SITE_OTHER): Payer: BLUE CROSS/BLUE SHIELD | Admitting: *Deleted

## 2015-12-03 DIAGNOSIS — Z950 Presence of cardiac pacemaker: Secondary | ICD-10-CM | POA: Diagnosis not present

## 2015-12-03 DIAGNOSIS — I5022 Chronic systolic (congestive) heart failure: Secondary | ICD-10-CM

## 2015-12-03 NOTE — Progress Notes (Signed)
Remote pacemaker transmission.   

## 2015-12-06 ENCOUNTER — Encounter: Payer: Self-pay | Admitting: Cardiology

## 2015-12-06 LAB — CUP PACEART REMOTE DEVICE CHECK
Battery Remaining Percentage: 35 %
Battery Voltage: 2.83 V
Brady Statistic AP VS Percent: 1 %
Brady Statistic AS VP Percent: 98 %
Brady Statistic AS VS Percent: 1 %
Brady Statistic RA Percent Paced: 1.8 %
Date Time Interrogation Session: 20161220072733
Implantable Lead Implant Date: 20110113
Implantable Lead Implant Date: 20110113
Implantable Lead Location: 753858
Implantable Lead Location: 753859
Implantable Lead Location: 753860
Lead Channel Impedance Value: 280 Ohm
Lead Channel Pacing Threshold Amplitude: 0.75 V
Lead Channel Pacing Threshold Amplitude: 1 V
Lead Channel Pacing Threshold Pulse Width: 0.4 ms
Lead Channel Pacing Threshold Pulse Width: 0.4 ms
Lead Channel Sensing Intrinsic Amplitude: 2.9 mV
Lead Channel Setting Pacing Amplitude: 2.375
Lead Channel Setting Pacing Pulse Width: 0.4 ms
Lead Channel Setting Sensing Sensitivity: 2 mV
MDC IDC LEAD IMPLANT DT: 20110113
MDC IDC MSMT BATTERY REMAINING LONGEVITY: 34 mo
MDC IDC MSMT LEADCHNL LV IMPEDANCE VALUE: 550 Ohm
MDC IDC MSMT LEADCHNL RA IMPEDANCE VALUE: 460 Ohm
MDC IDC MSMT LEADCHNL RV PACING THRESHOLD AMPLITUDE: 1.375 V
MDC IDC MSMT LEADCHNL RV PACING THRESHOLD PULSEWIDTH: 0.4 ms
MDC IDC SET LEADCHNL LV PACING AMPLITUDE: 2 V
MDC IDC SET LEADCHNL RA PACING AMPLITUDE: 1.75 V
MDC IDC SET LEADCHNL RV PACING PULSEWIDTH: 0.4 ms
MDC IDC STAT BRADY AP VP PERCENT: 1.9 %
Pulse Gen Model: 3210
Pulse Gen Serial Number: 2383113

## 2015-12-11 NOTE — Addendum Note (Signed)
Addended by: Friend Dorfman K on: 12/11/2015 04:06 PM   Modules accepted: Orders  

## 2015-12-12 ENCOUNTER — Ambulatory Visit (INDEPENDENT_AMBULATORY_CARE_PROVIDER_SITE_OTHER): Payer: BLUE CROSS/BLUE SHIELD | Admitting: Pharmacist

## 2015-12-12 ENCOUNTER — Other Ambulatory Visit (INDEPENDENT_AMBULATORY_CARE_PROVIDER_SITE_OTHER): Payer: BLUE CROSS/BLUE SHIELD | Admitting: *Deleted

## 2015-12-12 ENCOUNTER — Ambulatory Visit (HOSPITAL_COMMUNITY): Payer: BLUE CROSS/BLUE SHIELD | Attending: Cardiovascular Disease

## 2015-12-12 ENCOUNTER — Other Ambulatory Visit: Payer: Self-pay

## 2015-12-12 VITALS — BP 124/78

## 2015-12-12 DIAGNOSIS — I313 Pericardial effusion (noninflammatory): Secondary | ICD-10-CM | POA: Insufficient documentation

## 2015-12-12 DIAGNOSIS — I10 Essential (primary) hypertension: Secondary | ICD-10-CM | POA: Diagnosis not present

## 2015-12-12 DIAGNOSIS — I5022 Chronic systolic (congestive) heart failure: Secondary | ICD-10-CM | POA: Insufficient documentation

## 2015-12-12 DIAGNOSIS — I517 Cardiomegaly: Secondary | ICD-10-CM | POA: Insufficient documentation

## 2015-12-12 LAB — BASIC METABOLIC PANEL
BUN: 14 mg/dL (ref 7–25)
CO2: 26 mmol/L (ref 20–31)
Calcium: 9.6 mg/dL (ref 8.6–10.4)
Chloride: 99 mmol/L (ref 98–110)
Creat: 1.01 mg/dL (ref 0.50–1.05)
GLUCOSE: 103 mg/dL — AB (ref 65–99)
POTASSIUM: 4.1 mmol/L (ref 3.5–5.3)
Sodium: 136 mmol/L (ref 135–146)

## 2015-12-12 NOTE — Patient Instructions (Signed)
Your blood pressure was good today. Continue to monitor at home and call the clinic if you notice readings greater than 140/90.   Please follow-up with Dr. Katrinka BlazingSmith next year.

## 2015-12-12 NOTE — Progress Notes (Signed)
Patient ID: Alexis Clay    DOB: 2066/11/17                MRN:  [id number]     HPI: Alexis Clay is a 49 y.o. female referred by Dr. Katrinka BlazingSmith to HTN clinic.  Patient recently changed from atenolol to metoprolol approximately a month ago. She states she has been doing well on the new medication.  Current HTN meds:  Metoprolol XL 100mg  - one tablet daily Lisinopril/HCTZ 20/25mg  - one tablet daily  BP goal: <140/90  Home BP readings:  She does have an automatic cuff at home and checks her blood pressure about once a week. Her daughter, who is a Engineer, civil (consulting)nurse, also checks it. She is unable to recall the readings, but states that they have all fallen in the normal range.   Wt Readings from Last 3 Encounters:  11/13/15 213 lb 12.8 oz (96.979 kg)  09/03/15 205 lb 3.2 oz (93.078 kg)  08/16/14 197 lb (89.359 kg)   BP Readings from Last 3 Encounters:  11/13/15 140/82  09/03/15 118/78  08/16/14 136/80   Pulse Readings from Last 3 Encounters:  11/13/15 65  09/03/15 50  08/16/14 55    Renal function: CrCl cannot be calculated (Unknown ideal weight.).  Past Medical History  Diagnosis Date  . Cardiomyopathy (HCC)   . Systolic heart failure (HCC)     Due to hypertension (LVEF 35-55% after med therapy & resynchronizatoin)  . Hypertension, benign   . LBBB (left bundle branch block)   . Biventricular cardiac pacemaker in situ   . SVT (supraventricular tachycardia) (HCC)   . Sinus node dysfunction (HCC)   . Anxiety disorder   . Bradycardia   . Panic disorder without agoraphobia   . Supraventricular premature beats   . Sinoatrial node dysfunction (HCC)   . Dilated cardiomyopathy (HCC)   . CHF (congestive heart failure) (HCC)     Current Outpatient Prescriptions on File Prior to Visit  Medication Sig Dispense Refill  . aspirin 81 MG tablet Take 81 mg by mouth daily.    Marland Kitchen. lisinopril-hydrochlorothiazide (PRINZIDE,ZESTORETIC) 20-25 MG tablet TAKE 1 TABLET BY MOUTH EVERY DAY 30 tablet 10    . metoprolol succinate (TOPROL-XL) 100 MG 24 hr tablet Take 1 tablet (100 mg total) by mouth daily. Take with or immediately following a meal. 90 tablet 3   No current facility-administered medications on file prior to visit.    No Known Allergies   Assessment/Plan: Hypertension: She is at goal and doing well on the medication. Encouraged her to continue monitoring her blood pressure and call the clinic if she noticed numbers greater than 140/90. Plan to have her follow-up with Dr. Katrinka BlazingSmith at yearly appointment since she is doing well.

## 2015-12-13 ENCOUNTER — Ambulatory Visit: Payer: BLUE CROSS/BLUE SHIELD | Admitting: Pharmacist

## 2015-12-17 ENCOUNTER — Telehealth: Payer: Self-pay | Admitting: Interventional Cardiology

## 2015-12-17 NOTE — Telephone Encounter (Signed)
New message  ° ° °Patient calling for test results.   °

## 2015-12-17 NOTE — Telephone Encounter (Signed)
Pt aware of lab and echo results.  Bmet was normal  Echo results, heart size and function remain normal. Reassuring. Need to maintain medical regimen and aerobic activity.

## 2015-12-17 NOTE — Telephone Encounter (Signed)
-----   Message from Lyn RecordsHenry W Smith, MD sent at 12/13/2015 12:30 PM EST ----- Let her know heart size and function remain normal. Reassuring. Need to maintain medical regimen and aerobic activity.

## 2016-01-08 ENCOUNTER — Encounter (HOSPITAL_BASED_OUTPATIENT_CLINIC_OR_DEPARTMENT_OTHER): Payer: BLUE CROSS/BLUE SHIELD

## 2016-01-09 ENCOUNTER — Telehealth: Payer: Self-pay

## 2016-01-09 NOTE — Telephone Encounter (Signed)
Cardiac clearance placed in MR nurse fax box to be faxed to Dr.Mann/Ashley fax #[phone]

## 2016-01-15 ENCOUNTER — Other Ambulatory Visit: Payer: Self-pay | Admitting: Gastroenterology

## 2016-01-23 ENCOUNTER — Encounter (HOSPITAL_COMMUNITY): Payer: Self-pay | Admitting: *Deleted

## 2016-02-04 ENCOUNTER — Ambulatory Visit (HOSPITAL_COMMUNITY): Payer: BLUE CROSS/BLUE SHIELD | Admitting: Anesthesiology

## 2016-02-04 ENCOUNTER — Ambulatory Visit (HOSPITAL_COMMUNITY)
Admission: RE | Admit: 2016-02-04 | Discharge: 2016-02-04 | Disposition: A | Discharge status: Home / Self Care | Payer: BLUE CROSS/BLUE SHIELD | Source: Ambulatory Visit | Attending: Gastroenterology | Admitting: Gastroenterology

## 2016-02-04 ENCOUNTER — Encounter (HOSPITAL_COMMUNITY)
Admission: RE | Disposition: A | Discharge status: Home / Self Care | Payer: Self-pay | Source: Ambulatory Visit | Attending: Gastroenterology

## 2016-02-04 ENCOUNTER — Encounter (HOSPITAL_COMMUNITY): Payer: Self-pay

## 2016-02-04 DIAGNOSIS — I420 Dilated cardiomyopathy: Secondary | ICD-10-CM | POA: Insufficient documentation

## 2016-02-04 DIAGNOSIS — Z950 Presence of cardiac pacemaker: Secondary | ICD-10-CM | POA: Insufficient documentation

## 2016-02-04 DIAGNOSIS — I5020 Unspecified systolic (congestive) heart failure: Secondary | ICD-10-CM | POA: Insufficient documentation

## 2016-02-04 DIAGNOSIS — Z79899 Other long term (current) drug therapy: Secondary | ICD-10-CM | POA: Diagnosis not present

## 2016-02-04 DIAGNOSIS — Z7982 Long term (current) use of aspirin: Secondary | ICD-10-CM | POA: Insufficient documentation

## 2016-02-04 DIAGNOSIS — E669 Obesity, unspecified: Secondary | ICD-10-CM | POA: Diagnosis not present

## 2016-02-04 DIAGNOSIS — Z1211 Encounter for screening for malignant neoplasm of colon: Secondary | ICD-10-CM | POA: Diagnosis not present

## 2016-02-04 DIAGNOSIS — Z6834 Body mass index (BMI) 34.0-34.9, adult: Secondary | ICD-10-CM | POA: Insufficient documentation

## 2016-02-04 DIAGNOSIS — I110 Hypertensive heart disease with heart failure: Secondary | ICD-10-CM | POA: Insufficient documentation

## 2016-02-04 HISTORY — DX: Headache, unspecified: R51.9

## 2016-02-04 HISTORY — DX: Presence of cardiac pacemaker: Z95.0

## 2016-02-04 HISTORY — PX: COLONOSCOPY WITH PROPOFOL: SHX5780

## 2016-02-04 HISTORY — DX: Headache: R51

## 2016-02-04 SURGERY — COLONOSCOPY WITH PROPOFOL
Anesthesia: Monitor Anesthesia Care

## 2016-02-04 MED ORDER — LIDOCAINE HCL (CARDIAC) 20 MG/ML IV SOLN
INTRAVENOUS | Status: AC
  Filled 2016-02-04: qty 5

## 2016-02-04 MED ORDER — GLYCOPYRROLATE 0.2 MG/ML IJ SOLN
INTRAMUSCULAR | Status: AC
  Filled 2016-02-04: qty 1

## 2016-02-04 MED ORDER — SODIUM CHLORIDE 0.9 % IJ SOLN
INTRAMUSCULAR | Status: AC
  Filled 2016-02-04: qty 10

## 2016-02-04 MED ORDER — PROPOFOL 500 MG/50ML IV EMUL
INTRAVENOUS | Status: DC | PRN
  Administered 2016-02-04: 40 mg via INTRAVENOUS
  Administered 2016-02-04: 20 mg via INTRAVENOUS

## 2016-02-04 MED ORDER — SODIUM CHLORIDE 0.9 % IV SOLN: INTRAVENOUS | Status: DC

## 2016-02-04 MED ORDER — ONDANSETRON HCL 4 MG/2ML IJ SOLN
INTRAMUSCULAR | Status: AC
  Filled 2016-02-04: qty 2

## 2016-02-04 MED ORDER — EPHEDRINE SULFATE 50 MG/ML IJ SOLN
INTRAMUSCULAR | Status: AC
  Filled 2016-02-04: qty 2

## 2016-02-04 MED ORDER — PROPOFOL 500 MG/50ML IV EMUL
INTRAVENOUS | Status: DC | PRN
  Administered 2016-02-04: 150 ug/kg/min via INTRAVENOUS

## 2016-02-04 MED ORDER — LACTATED RINGERS IV SOLN
INTRAVENOUS | Status: DC
  Administered 2016-02-04: 07:00:00 via INTRAVENOUS

## 2016-02-04 MED ORDER — PHENYLEPHRINE 40 MCG/ML (10ML) SYRINGE FOR IV PUSH (FOR BLOOD PRESSURE SUPPORT)
PREFILLED_SYRINGE | INTRAVENOUS | Status: AC
  Filled 2016-02-04: qty 10

## 2016-02-04 MED ORDER — PROPOFOL 10 MG/ML IV BOLUS
INTRAVENOUS | Status: AC
  Filled 2016-02-04: qty 60

## 2016-02-04 SURGICAL SUPPLY — 21 items

## 2016-02-04 NOTE — Discharge Instructions (Signed)

## 2016-02-04 NOTE — Anesthesia Postprocedure Evaluation (Signed)
Anesthesia Post Note  Patient: Alexis Clay  Procedure(s) Performed: Procedure(s) (LRB): COLONOSCOPY WITH PROPOFOL (N/A)  Patient location during evaluation: PACU Anesthesia Type: MAC Level of consciousness: awake and alert Pain management: pain level controlled Vital Signs Assessment: post-procedure vital signs reviewed and stable Respiratory status: spontaneous breathing, nonlabored ventilation, respiratory function stable and patient connected to nasal cannula oxygen Cardiovascular status: stable and blood pressure returned to baseline Anesthetic complications: no    Last Vitals:  Filed Vitals:   02/04/16 0800 02/04/16 0810  BP: 166/123 183/97  Pulse: 60 57  Temp:    Resp: 15 10    Last Pain: There were no vitals filed for this visit.               Jin Shockley S

## 2016-02-04 NOTE — Op Note (Signed)
[facility] [address] [place], [address]   OPERATIVE PROCEDURE REPORT  PATIENT: Alexis, Clay  MR#: [id number] BIRTHDATE: 1966/05/17 GENDER: female ENDOSCOPIST: Lorenza Burton, MD ASSISTANT:   Arlee Muslim, technician & Dwain Sarna, RN PROCEDURE DATE: 02/26/16 PRE-PROCEDURE PREPARATION: Patient fasted for 4 hours prior to procedure. The patient was prepped with a gallon of Golytely the night prior to the procedure. PRE-PROCEDURE PHYSICAL: Patient has stable vital signs.  Neck is supple.  There is no JVD, thyromegaly or LAD.  Chest clear to auscultation.  S1 and S2 regular.  Abdomen soft, non-distended, non-tender with NABS. PROCEDURE:     Colonoscopy, diagnostic ASA CLASS:     Class III INDICATIONS:     1.  Colorectal cancer screening-average risk patient for colon cancer. MEDICATIONS:     Monitored anesthesia care  DESCRIPTION OF PROCEDURE:  After the risks, benefits, and alternatives of the procedure were thoroughly explained [including a 10% missed rate of cancer and polyps], informed consent was obtained. Digital rectal exam was performed. The Pentax video colonoscope [id number]  was introduced through the anus  and advanced to the terminal ileum which was intubated for a short distance , limited by No adverse events experienced. The quality of the prep was adequate . Multiple washes were done. Small lesions could be missed. The instrument was then slowly withdrawn as the colon was fully examined. Estimated blood loss is zero unless otherwise noted in this procedure report.     COLON FINDINGS: A normal appearing cecum, ileocecal valve, and appendiceal orifice were identified.  The ascending, transverse, descending, sigmoid colon, and rectum appeared unremarkable. The examined terminal ileum appeared to be normal.   The entire colonic mucosa appeared healthy with a normal vascular pattern.  No masses, polyps, diverticula or AVMs were noted.   The appendiceal orifice and the ICV were identified and photographed.  The terminal ileum appeared normal.  Retroflexed views revealed no abnormalities.  The patient tolerated the procedure without immediate complications. The scope was then withdrawn from the patient and the procedure terminated.  TIME TO CECUM:  5 minutes 00 seconds WITHDRAW TIME:  6 minutes 00 seconds  IMPRESSION:     1.  Normal colonoscopy 2.  The examined terminal ileum appeared to be normal  RECOMMENDATIONS:     1.  Continue current medications 2.  Continue surveillance 3.  High fiber diet with liberal fluid intake. 4.  OP follow-up is advised on a PRN basis.  REPEAT EXAM:      In 10 years  for a repeat colonoscopy. If the patient has any abnormal GI symptoms in the interim, she have been advised to contact the office as soon as possible for further recommendations.   REFERRED WN:UUVOZ Morris, MD Verdis Prime, MD eSigned:  Lorenza Burton, MD 26-Feb-2016 7:48 AM  CPT CODES:     [phone] Colonoscopy, flexible, proximal to splenic flexure; diagnostic, with or without collection of specimen(s) by brushing or washing, with or without colon decompression (separate procedure) ICD CODES:     Z12.11 Encounter for screening for malignant neoplasm of colon  The ICD and CPT codes recommended by this software are interpretations from the data that the clinical staff has captured with the software.  The verification of the translation of this report to the ICD and CPT codes and modifiers is the sole responsibility of the health care institution and practicing physician where this report was generated.  [employer] will not be held responsible for  the validity of the ICD and CPT codes included on this report.  AMA assumes no liability for data contained or not contained herein. CPT is a Publishing rights manager of the [employer].  PATIENT NAME:  Alexis, Clay MR#: [id number]

## 2016-02-04 NOTE — H&P (Signed)
Alexis Clay is an 50 y.o. female.   Chief Complaint: Colorectal cancer screening. HPI: 50 year old black female here for a screening colonoscopy. See office notes for details.   Past Medical History  Diagnosis Date  . Cardiomyopathy (HCC)   . Systolic heart failure (HCC)     Due to hypertension (LVEF 35-55% after med therapy & resynchronizatoin)  . Hypertension, benign   . LBBB (left bundle branch block)   . Biventricular cardiac pacemaker in situ   . SVT (supraventricular tachycardia) (HCC)   . Sinus node dysfunction (HCC)   . Anxiety disorder   . Bradycardia   . Panic disorder without agoraphobia   . Supraventricular premature beats   . Sinoatrial node dysfunction (HCC)   . Dilated cardiomyopathy (HCC)   . CHF (congestive heart failure) (HCC)   . Presence of permanent cardiac pacemaker   . Headache    Past Surgical History  Procedure Laterality Date  . Cardiac catheterization  2011    With widely patent coronary arteries   . Cesarean section  1991  . Leep  10/2010  . Pacemaker insertion  12/26/2009    St. Jude BiV PM.  Model # [id number].  Serial # [id number]    . Tubal ligation     Family History  Problem Relation Age of Onset  . Cancer Mother     cervical  . Heart disease Mother     CABGx3 in 2014  . CAD Father   . Congestive Heart Failure Father   . Hypertension Father   . Renal Disease Father     CKD, ESRD  . Hypertension Brother   . Hypertension Brother   . Hypertension Sister    Social History:  reports that she has never smoked. She does not have any smokeless tobacco history on file. She reports that she drinks alcohol. She reports that she does not use illicit drugs.  Allergies: No Known Allergies  Medications Prior to Admission  Medication Sig Dispense Refill  . aspirin 81 MG tablet Take 81 mg by mouth daily.    Marland Kitchen lisinopril-hydrochlorothiazide (PRINZIDE,ZESTORETIC) 20-25 MG tablet TAKE 1 TABLET BY MOUTH EVERY DAY 30 tablet 10  . metoprolol succinate  (TOPROL-XL) 100 MG 24 hr tablet Take 1 tablet (100 mg total) by mouth daily. Take with or immediately following a meal. 90 tablet 3  . OVER THE COUNTER MEDICATION Take 1 tablet by mouth daily. Multivitamin w/ lactobacillus     Review of Systems  Constitutional: Negative.   HENT: Negative.   Eyes: Negative.   Respiratory: Negative.   Cardiovascular: Negative.   Gastrointestinal: Positive for heartburn.  Genitourinary: Negative.   Musculoskeletal: Negative.   Skin: Negative.   Neurological: Negative.   Endo/Heme/Allergies: Negative.   Psychiatric/Behavioral: Negative.    Blood pressure 156/77, pulse 75, temperature 98 F (36.7 C), temperature source Oral, resp. rate 17, height  (1.676 m), weight 96.616 kg (213 lb), last menstrual period 01/15/2016, SpO2 100 %. Physical Exam  Constitutional: She is oriented to person, place, and time. She appears well-developed and well-nourished.  HENT:  Head: Normocephalic and atraumatic.  Eyes: Conjunctivae and EOM are normal. Pupils are equal, round, and reactive to light.  Neck: Normal range of motion. Neck supple.  Cardiovascular: Normal rate and regular rhythm.   Respiratory: Effort normal and breath sounds normal.  GI: Soft. Bowel sounds are normal.  Musculoskeletal: Normal range of motion.  Neurological: She is alert and oriented to person, place, and time.  Skin: Skin  is warm and dry.  Psychiatric: She has a normal mood and affect. Her behavior is normal. Judgment and thought content normal.    Assessment/Plan Colorectal cancer screening: proceed with a colonoscopy at this time.  Brentley Landfair, MD 02/04/2016, 7:16 AM

## 2016-02-04 NOTE — Anesthesia Preprocedure Evaluation (Signed)
Anesthesia Evaluation  Patient identified by MRN, date of birth, ID band Patient awake    Reviewed: Allergy & Precautions, NPO status , Patient's Chart, lab work & pertinent test results  Airway Mallampati: II   Neck ROM: full    Dental   Pulmonary neg pulmonary ROS,    breath sounds clear to auscultation       Cardiovascular hypertension, +CHF  + dysrhythmias + pacemaker  Rhythm:regular Rate:Normal  EF 35%   Neuro/Psych  Headaches,    GI/Hepatic   Endo/Other  obese  Renal/GU      Musculoskeletal   Abdominal   Peds  Hematology   Anesthesia Other Findings   Reproductive/Obstetrics                             Anesthesia Physical Anesthesia Plan  ASA: III  Anesthesia Plan: MAC   Post-op Pain Management:    Induction: Intravenous  Airway Management Planned: Simple Face Mask  Additional Equipment:   Intra-op Plan:   Post-operative Plan:   Informed Consent: I have reviewed the patients History and Physical, chart, labs and discussed the procedure including the risks, benefits and alternatives for the proposed anesthesia with the patient or authorized representative who has indicated his/her understanding and acceptance.     Plan Discussed with: CRNA, Anesthesiologist and Surgeon  Anesthesia Plan Comments:         Anesthesia Quick Evaluation

## 2016-02-04 NOTE — Transfer of Care (Signed)
Immediate Anesthesia Transfer of Care Note  Patient: Alexis Clay  Procedure(s) Performed: Procedure(s): COLONOSCOPY WITH PROPOFOL (N/A)  Patient Location: PACU  Anesthesia Type:MAC  Level of Consciousness:  sedated, patient cooperative and responds to stimulation  Airway & Oxygen Therapy:Patient Spontanous Breathing and Patient connected to face mask oxgen  Post-op Assessment:  Report given to PACU RN and Post -op Vital signs reviewed and stable  Post vital signs:  Reviewed and stable  Last Vitals:  Filed Vitals:   02/04/16 0637  BP: 156/77  Pulse: 75  Temp: 36.7 C  Resp: 17    Complications: No apparent anesthesia complications

## 2016-02-05 ENCOUNTER — Encounter (HOSPITAL_COMMUNITY): Payer: Self-pay | Admitting: Gastroenterology

## 2016-03-03 ENCOUNTER — Ambulatory Visit (INDEPENDENT_AMBULATORY_CARE_PROVIDER_SITE_OTHER): Payer: BLUE CROSS/BLUE SHIELD | Admitting: *Deleted

## 2016-03-03 DIAGNOSIS — Z950 Presence of cardiac pacemaker: Secondary | ICD-10-CM | POA: Diagnosis not present

## 2016-03-03 DIAGNOSIS — I5022 Chronic systolic (congestive) heart failure: Secondary | ICD-10-CM

## 2016-03-04 NOTE — Progress Notes (Signed)
Remote pacemaker transmission.   

## 2016-04-09 DIAGNOSIS — Z1231 Encounter for screening mammogram for malignant neoplasm of breast: Secondary | ICD-10-CM | POA: Diagnosis not present

## 2016-04-13 LAB — CUP PACEART REMOTE DEVICE CHECK
Battery Remaining Longevity: 30 mo
Battery Voltage: 2.81 V
Brady Statistic AP VS Percent: 1 %
Brady Statistic AS VP Percent: 97 %
Brady Statistic RA Percent Paced: 2.8 %
Implantable Lead Implant Date: 20110113
Implantable Lead Implant Date: 20110113
Implantable Lead Location: 753859
Lead Channel Impedance Value: 280 Ohm
Lead Channel Impedance Value: 540 Ohm
Lead Channel Pacing Threshold Amplitude: 0.75 V
Lead Channel Pacing Threshold Pulse Width: 0.4 ms
Lead Channel Pacing Threshold Pulse Width: 0.4 ms
Lead Channel Pacing Threshold Pulse Width: 0.4 ms
Lead Channel Setting Pacing Amplitude: 1.75 V
Lead Channel Setting Pacing Amplitude: 2 V
Lead Channel Setting Pacing Amplitude: 2.125
Lead Channel Setting Pacing Pulse Width: 0.4 ms
Lead Channel Setting Pacing Pulse Width: 0.4 ms
MDC IDC LEAD IMPLANT DT: 20110113
MDC IDC LEAD LOCATION: 753858
MDC IDC LEAD LOCATION: 753860
MDC IDC MSMT BATTERY REMAINING PERCENTAGE: 29 %
MDC IDC MSMT LEADCHNL LV IMPEDANCE VALUE: 600 Ohm
MDC IDC MSMT LEADCHNL LV PACING THRESHOLD AMPLITUDE: 1 V
MDC IDC MSMT LEADCHNL RA SENSING INTR AMPL: 1.7 mV
MDC IDC MSMT LEADCHNL RV PACING THRESHOLD AMPLITUDE: 1.125 V
MDC IDC MSMT LEADCHNL RV SENSING INTR AMPL: 12 mV
MDC IDC PG SERIAL: 2383113
MDC IDC SESS DTM: 20170321075749
MDC IDC SET LEADCHNL RV SENSING SENSITIVITY: 2 mV
MDC IDC STAT BRADY AP VP PERCENT: 2.9 %
MDC IDC STAT BRADY AS VS PERCENT: 1 %

## 2016-04-14 ENCOUNTER — Encounter: Payer: Self-pay | Admitting: Cardiology

## 2016-05-18 DIAGNOSIS — L2389 Allergic contact dermatitis due to other agents: Secondary | ICD-10-CM | POA: Diagnosis not present

## 2016-06-02 ENCOUNTER — Ambulatory Visit (INDEPENDENT_AMBULATORY_CARE_PROVIDER_SITE_OTHER): Payer: BLUE CROSS/BLUE SHIELD | Admitting: *Deleted

## 2016-06-02 DIAGNOSIS — Z950 Presence of cardiac pacemaker: Secondary | ICD-10-CM | POA: Diagnosis not present

## 2016-06-02 NOTE — Progress Notes (Signed)
Remote pacemaker transmission.   

## 2016-06-03 LAB — CUP PACEART REMOTE DEVICE CHECK
Battery Remaining Longevity: 22 mo
Battery Voltage: 2.78 V
Brady Statistic RA Percent Paced: 2.9 %
Implantable Lead Implant Date: 20110113
Implantable Lead Implant Date: 20110113
Implantable Lead Implant Date: 20110113
Lead Channel Impedance Value: 450 Ohm
Lead Channel Pacing Threshold Amplitude: 0.75 V
Lead Channel Pacing Threshold Pulse Width: 0.4 ms
Lead Channel Sensing Intrinsic Amplitude: 1.5 mV
Lead Channel Setting Pacing Amplitude: 1.75 V
Lead Channel Setting Pacing Amplitude: 2 V
Lead Channel Setting Pacing Amplitude: 2.125
Lead Channel Setting Pacing Pulse Width: 0.4 ms
Lead Channel Setting Pacing Pulse Width: 0.4 ms
MDC IDC LEAD LOCATION: 753858
MDC IDC LEAD LOCATION: 753859
MDC IDC LEAD LOCATION: 753860
MDC IDC MSMT BATTERY REMAINING PERCENTAGE: 22 %
MDC IDC MSMT LEADCHNL LV IMPEDANCE VALUE: 530 Ohm
MDC IDC MSMT LEADCHNL LV PACING THRESHOLD AMPLITUDE: 1 V
MDC IDC MSMT LEADCHNL LV PACING THRESHOLD PULSEWIDTH: 0.4 ms
MDC IDC MSMT LEADCHNL RV IMPEDANCE VALUE: 260 Ohm
MDC IDC MSMT LEADCHNL RV PACING THRESHOLD AMPLITUDE: 1.125 V
MDC IDC MSMT LEADCHNL RV PACING THRESHOLD PULSEWIDTH: 0.4 ms
MDC IDC MSMT LEADCHNL RV SENSING INTR AMPL: 12 mV
MDC IDC PG SERIAL: 2383113
MDC IDC SESS DTM: 20170620063213
MDC IDC SET LEADCHNL RV SENSING SENSITIVITY: 2 mV
MDC IDC STAT BRADY AP VP PERCENT: 3 %
MDC IDC STAT BRADY AP VS PERCENT: 1 %
MDC IDC STAT BRADY AS VP PERCENT: 97 %
MDC IDC STAT BRADY AS VS PERCENT: 1 %

## 2016-06-05 ENCOUNTER — Encounter: Payer: Self-pay | Admitting: Cardiology

## 2016-08-19 DIAGNOSIS — J069 Acute upper respiratory infection, unspecified: Secondary | ICD-10-CM | POA: Diagnosis not present

## 2016-08-19 DIAGNOSIS — B9789 Other viral agents as the cause of diseases classified elsewhere: Secondary | ICD-10-CM | POA: Diagnosis not present

## 2016-08-19 DIAGNOSIS — R21 Rash and other nonspecific skin eruption: Secondary | ICD-10-CM | POA: Diagnosis not present

## 2016-09-02 ENCOUNTER — Encounter: Payer: Self-pay | Admitting: Internal Medicine

## 2016-09-16 ENCOUNTER — Encounter: Payer: Self-pay | Admitting: Internal Medicine

## 2016-09-16 ENCOUNTER — Ambulatory Visit (INDEPENDENT_AMBULATORY_CARE_PROVIDER_SITE_OTHER): Payer: BLUE CROSS/BLUE SHIELD | Admitting: Internal Medicine

## 2016-09-16 VITALS — BP 126/84 | HR 50 | Ht 66.0 in | Wt 212.0 lb

## 2016-09-16 DIAGNOSIS — Z950 Presence of cardiac pacemaker: Secondary | ICD-10-CM | POA: Diagnosis not present

## 2016-09-16 DIAGNOSIS — I5022 Chronic systolic (congestive) heart failure: Secondary | ICD-10-CM | POA: Diagnosis not present

## 2016-09-16 LAB — CUP PACEART INCLINIC DEVICE CHECK
Battery Remaining Longevity: 14.4
Brady Statistic RA Percent Paced: 3.2 %
Brady Statistic RV Percent Paced: 99.62 %
Implantable Lead Implant Date: 20110113
Implantable Lead Location: 753859
Lead Channel Impedance Value: 587.5 Ohm
Lead Channel Pacing Threshold Amplitude: 0.75 V
Lead Channel Pacing Threshold Amplitude: 0.875 V
Lead Channel Pacing Threshold Amplitude: 1.125 V
Lead Channel Sensing Intrinsic Amplitude: 12 mV
Lead Channel Setting Pacing Amplitude: 1.75 V
Lead Channel Setting Pacing Amplitude: 2 V
Lead Channel Setting Pacing Pulse Width: 0.4 ms
Lead Channel Setting Pacing Pulse Width: 0.4 ms
MDC IDC LEAD IMPLANT DT: 20110113
MDC IDC LEAD IMPLANT DT: 20110113
MDC IDC LEAD LOCATION: 753858
MDC IDC LEAD LOCATION: 753860
MDC IDC MSMT BATTERY VOLTAGE: 2.74 V
MDC IDC MSMT LEADCHNL LV PACING THRESHOLD PULSEWIDTH: 0.4 ms
MDC IDC MSMT LEADCHNL RA IMPEDANCE VALUE: 512.5 Ohm
MDC IDC MSMT LEADCHNL RA PACING THRESHOLD PULSEWIDTH: 0.4 ms
MDC IDC MSMT LEADCHNL RA SENSING INTR AMPL: 2.9 mV
MDC IDC MSMT LEADCHNL RV IMPEDANCE VALUE: 275 Ohm
MDC IDC MSMT LEADCHNL RV PACING THRESHOLD PULSEWIDTH: 0.4 ms
MDC IDC PG SERIAL: 2383113
MDC IDC SESS DTM: 20171004174827
MDC IDC SET LEADCHNL RV PACING AMPLITUDE: 2.125
MDC IDC SET LEADCHNL RV SENSING SENSITIVITY: 2 mV

## 2016-09-16 NOTE — Progress Notes (Signed)
HPI [name] returns today for ongoing evaluation and management of her biventricular pacemaker. She is a very pleasant 50 year old woman who is moved [place]. She has a history of hypertension and hypertensive heart disease, and left bundle branch block, who underwent biventricular pacemaker insertion approximately 5 years ago. She has done well in the interim. Her symptoms are really class I. She does not have angina, and denies peripheral edema. No syncope. No palpitations. No Known Allergies   Current Outpatient Prescriptions  Medication Sig Dispense Refill  . aspirin 81 MG tablet Take 81 mg by mouth daily.    Marland Kitchen. lisinopril-hydrochlorothiazide (PRINZIDE,ZESTORETIC) 20-25 MG tablet TAKE 1 TABLET BY MOUTH EVERY DAY 30 tablet 10  . metoprolol succinate (TOPROL-XL) 100 MG 24 hr tablet Take 1 tablet (100 mg total) by mouth daily. Take with or immediately following a meal. 90 tablet 3   No current facility-administered medications for this visit.      Past Medical History:  Diagnosis Date  . Anxiety disorder   . Biventricular cardiac pacemaker in situ   . Bradycardia   . Cardiomyopathy (HCC)   . CHF (congestive heart failure) (HCC)   . Dilated cardiomyopathy (HCC)   . Headache   . Hypertension, benign   . LBBB (left bundle branch block)   . Panic disorder without agoraphobia   . Presence of permanent cardiac pacemaker   . Sinoatrial node dysfunction (HCC)   . Sinus node dysfunction (HCC)   . Supraventricular premature beats   . SVT (supraventricular tachycardia) (HCC)   . Systolic heart failure (HCC)    Due to hypertension (LVEF 35-55% after med therapy & resynchronizatoin)    ROS:   All systems reviewed and negative except as noted in the HPI.   Past Surgical History:  Procedure Laterality Date  . CARDIAC CATHETERIZATION  2011   With widely patent coronary arteries   . CESAREAN SECTION  1991  . COLONOSCOPY WITH PROPOFOL N/A 02/04/2016   Procedure:  COLONOSCOPY WITH PROPOFOL;  Surgeon: Charna ElizabethJyothi Mann, MD;  Location: [facility];  Service: Endoscopy;  Laterality: N/A;  . LEEP  10/2010  . PACEMAKER INSERTION  12/26/2009   St. Jude BiV PM.  Model # [id number].  Serial # [id number]    . TUBAL LIGATION       Family History  Problem Relation Age of Onset  . Cancer Mother     cervical  . Heart disease Mother     CABGx3 in 2014  . CAD Father   . Congestive Heart Failure Father   . Hypertension Father   . Renal Disease Father     CKD, ESRD  . Hypertension Brother   . Hypertension Brother   . Hypertension Sister      Social History   Social History  . Marital status: Legally Separated    Spouse name: N/A  . Number of children: N/A  . Years of education: N/A   Occupational History  . Not on file.   Social History Main Topics  . Smoking status: Never Smoker  . Smokeless tobacco: Never Used  . Alcohol use Yes     Comment: occasionally  . Drug use: No  . Sexual activity: Not on file   Other Topics Concern  . Not on file   Social History Narrative  . No narrative on file     BP 126/84   Pulse (!) 50   Ht 5\' 6"  (1.676 m)   Wt 212 lb (  96.2 kg)   BMI 34.22 kg/m   Physical Exam:  Well appearing 50 year old woman, NAD HEENT: Unremarkable Neck:  6 cm JVD, no thyromegally Back:  No CVA tenderness Lungs:  Clear with no wheezes, rales, or rhonchi. Well-healed pacemaker incision. HEART:  Regular rate rhythm, no murmurs, no rubs, no clicks Abd:  soft, positive bowel sounds, no organomegally, no rebound, no guarding Ext:  2 plus pulses, no edema, no cyanosis, no clubbing Skin:  No rashes no nodules Neuro:  CN II through XII intact, motor grossly intact  EKG - normal sinus rhythm, with biventricular pacing.  DEVICE  Normal device function.  See PaceArt for details.   Assess/Plan: 1. Chronic systolic heart failure - her symptoms are well-controlled. She will continue her current medical therapy. She is encouraged to lose  weight. 2. Hypertension - her blood pressure is reasonably well controlled. She is encouraged to maintain a low-salt diet. 3. Pacemaker - her St. Jude biventricular pacemaker is working normally. We'll plan to recheck in several months. She has approximately 1 year of battery longevity.  Lewayne Bunting, M.D.

## 2016-09-16 NOTE — Patient Instructions (Addendum)
Medication Instructions:  Your physician recommends that you continue on your current medications as directed. Please refer to the Current Medication list given to you today.  Labwork: None Ordered   Testing/Procedures: None Ordered   Follow-Up: Your physician wants you to follow-up in: 1 year with Dr. Ladona Ridgelaylor. You will receive a reminder letter in the mail two months in advance. If you don't receive a letter, please call our office to schedule the follow-up appointment.    Remote monitoring is used to monitor your Pacemaker from home. This monitoring reduces the number of office visits required to check your device to one time per year. It allows us to keep an eye on the functioning of your device to ensure it is working properly. You are scheduled for a device check from home on 12/17/15. You may send your transmission at any time that day. If you have a wireless device, the transmission will be sent automatically. After your physician reviews your transmission, you will receive a postcard with your next transmission date.    Any Other Special Instructions Will Be Listed Below (If Applicable).     If you need a refill on your cardiac medications before your next appointment, please call your pharmacy.

## 2016-09-22 DIAGNOSIS — L308 Other specified dermatitis: Secondary | ICD-10-CM | POA: Diagnosis not present

## 2016-09-22 DIAGNOSIS — L309 Dermatitis, unspecified: Secondary | ICD-10-CM | POA: Diagnosis not present

## 2016-12-16 ENCOUNTER — Ambulatory Visit (INDEPENDENT_AMBULATORY_CARE_PROVIDER_SITE_OTHER): Payer: BLUE CROSS/BLUE SHIELD | Admitting: *Deleted

## 2016-12-16 DIAGNOSIS — Z950 Presence of cardiac pacemaker: Secondary | ICD-10-CM

## 2016-12-16 DIAGNOSIS — I5022 Chronic systolic (congestive) heart failure: Secondary | ICD-10-CM

## 2016-12-17 NOTE — Progress Notes (Signed)
Remote pacemaker transmission.   

## 2016-12-18 ENCOUNTER — Encounter: Payer: Self-pay | Admitting: Cardiology

## 2016-12-22 ENCOUNTER — Other Ambulatory Visit: Payer: Self-pay | Admitting: Interventional Cardiology

## 2016-12-22 DIAGNOSIS — I5022 Chronic systolic (congestive) heart failure: Secondary | ICD-10-CM

## 2017-01-02 LAB — CUP PACEART REMOTE DEVICE CHECK
Battery Remaining Longevity: 8 mo
Brady Statistic AP VS Percent: 1 %
Brady Statistic AS VP Percent: 97 %
Brady Statistic AS VS Percent: 1 %
Implantable Lead Implant Date: 20110113
Implantable Lead Implant Date: 20110113
Implantable Lead Location: 753859
Implantable Lead Location: 753860
Implantable Pulse Generator Implant Date: 20110113
Lead Channel Impedance Value: 260 Ohm
Lead Channel Impedance Value: 460 Ohm
Lead Channel Impedance Value: 530 Ohm
Lead Channel Pacing Threshold Amplitude: 0.875 V
Lead Channel Pacing Threshold Amplitude: 1 V
Lead Channel Pacing Threshold Amplitude: 1 V
Lead Channel Pacing Threshold Pulse Width: 0.4 ms
Lead Channel Sensing Intrinsic Amplitude: 12 mV
Lead Channel Setting Pacing Amplitude: 1.875
Lead Channel Setting Pacing Pulse Width: 0.4 ms
Lead Channel Setting Pacing Pulse Width: 0.4 ms
Lead Channel Setting Sensing Sensitivity: 2 mV
MDC IDC LEAD IMPLANT DT: 20110113
MDC IDC LEAD LOCATION: 753858
MDC IDC MSMT BATTERY REMAINING PERCENTAGE: 8 %
MDC IDC MSMT BATTERY VOLTAGE: 2.69 V
MDC IDC MSMT LEADCHNL RA PACING THRESHOLD PULSEWIDTH: 0.4 ms
MDC IDC MSMT LEADCHNL RA SENSING INTR AMPL: 1.8 mV
MDC IDC MSMT LEADCHNL RV PACING THRESHOLD PULSEWIDTH: 0.4 ms
MDC IDC PG SERIAL: 2383113
MDC IDC SESS DTM: 20180103073741
MDC IDC SET LEADCHNL LV PACING AMPLITUDE: 2 V
MDC IDC SET LEADCHNL RV PACING AMPLITUDE: 2 V
MDC IDC STAT BRADY AP VP PERCENT: 2.4 %
MDC IDC STAT BRADY RA PERCENT PACED: 2.4 %

## 2017-01-15 DIAGNOSIS — Z0000 Encounter for general adult medical examination without abnormal findings: Secondary | ICD-10-CM | POA: Diagnosis not present

## 2017-03-17 ENCOUNTER — Telehealth: Payer: Self-pay | Admitting: Cardiology

## 2017-03-17 ENCOUNTER — Encounter: Payer: BLUE CROSS/BLUE SHIELD | Admitting: *Deleted

## 2017-03-17 ENCOUNTER — Ambulatory Visit (INDEPENDENT_AMBULATORY_CARE_PROVIDER_SITE_OTHER): Payer: BLUE CROSS/BLUE SHIELD | Admitting: *Deleted

## 2017-03-17 DIAGNOSIS — I5022 Chronic systolic (congestive) heart failure: Secondary | ICD-10-CM

## 2017-03-17 DIAGNOSIS — Z950 Presence of cardiac pacemaker: Secondary | ICD-10-CM

## 2017-03-17 NOTE — Telephone Encounter (Signed)
Spoke with pt and reminded pt of remote transmission that is due today. Pt verbalized understanding.   

## 2017-03-18 ENCOUNTER — Telehealth: Payer: Self-pay

## 2017-03-18 NOTE — Telephone Encounter (Signed)
Patient called to verify remote transmission. It has not been received as of yet. She states she had moved her home monitor a couple of months ago which correlates with our last connection to it. She will be at home this afternoon and attempt to try again.

## 2017-03-19 ENCOUNTER — Encounter: Payer: Self-pay | Admitting: Cardiology

## 2017-03-25 NOTE — Progress Notes (Signed)
Remote pacemaker transmission.   

## 2017-03-26 ENCOUNTER — Encounter: Payer: Self-pay | Admitting: Cardiology

## 2017-03-26 LAB — CUP PACEART REMOTE DEVICE CHECK
Battery Voltage: 2.66 V
Brady Statistic AP VP Percent: 3.7 %
Brady Statistic AP VS Percent: 1 %
Brady Statistic AS VP Percent: 96 %
Implantable Lead Implant Date: 20110113
Implantable Lead Implant Date: 20110113
Implantable Lead Location: 753860
Lead Channel Impedance Value: 260 Ohm
Lead Channel Impedance Value: 530 Ohm
Lead Channel Pacing Threshold Pulse Width: 0.4 ms
Lead Channel Pacing Threshold Pulse Width: 0.4 ms
Lead Channel Sensing Intrinsic Amplitude: 12 mV
Lead Channel Setting Pacing Amplitude: 1.75 V
Lead Channel Setting Pacing Pulse Width: 0.4 ms
Lead Channel Setting Sensing Sensitivity: 2 mV
MDC IDC LEAD IMPLANT DT: 20110113
MDC IDC LEAD LOCATION: 753858
MDC IDC LEAD LOCATION: 753859
MDC IDC MSMT BATTERY REMAINING LONGEVITY: 5 mo
MDC IDC MSMT BATTERY REMAINING PERCENTAGE: 5 %
MDC IDC MSMT LEADCHNL LV PACING THRESHOLD AMPLITUDE: 0.875 V
MDC IDC MSMT LEADCHNL RA IMPEDANCE VALUE: 490 Ohm
MDC IDC MSMT LEADCHNL RA PACING THRESHOLD AMPLITUDE: 0.75 V
MDC IDC MSMT LEADCHNL RA PACING THRESHOLD PULSEWIDTH: 0.4 ms
MDC IDC MSMT LEADCHNL RA SENSING INTR AMPL: 2.6 mV
MDC IDC MSMT LEADCHNL RV PACING THRESHOLD AMPLITUDE: 1.375 V
MDC IDC PG IMPLANT DT: 20110113
MDC IDC SESS DTM: 20180404061229
MDC IDC SET LEADCHNL LV PACING AMPLITUDE: 2 V
MDC IDC SET LEADCHNL RV PACING AMPLITUDE: 2.375
MDC IDC SET LEADCHNL RV PACING PULSEWIDTH: 0.4 ms
MDC IDC STAT BRADY AS VS PERCENT: 1 %
MDC IDC STAT BRADY RA PERCENT PACED: 3.6 %
Pulse Gen Serial Number: 2383113

## 2017-04-12 DIAGNOSIS — L309 Dermatitis, unspecified: Secondary | ICD-10-CM | POA: Diagnosis not present

## 2017-05-12 DIAGNOSIS — Z6836 Body mass index (BMI) 36.0-36.9, adult: Secondary | ICD-10-CM | POA: Diagnosis not present

## 2017-05-12 DIAGNOSIS — Z01419 Encounter for gynecological examination (general) (routine) without abnormal findings: Secondary | ICD-10-CM | POA: Diagnosis not present

## 2017-05-20 DIAGNOSIS — Z1231 Encounter for screening mammogram for malignant neoplasm of breast: Secondary | ICD-10-CM | POA: Diagnosis not present

## 2017-05-31 ENCOUNTER — Telehealth: Payer: Self-pay | Admitting: Cardiology

## 2017-05-31 NOTE — Telephone Encounter (Signed)
Spoke w/ pt and informed her that her device has reached ERI. Informed her a scheduler will be in touch to schedule an appt w/ MD / NP / PA. Pt verbalized understanding.

## 2017-06-16 NOTE — Progress Notes (Signed)
Cardiology Office Note Date:  06/17/2017  Patient ID:  Alexis Clay, Alexis Clay May 01, 1966, MRN [id number] PCP:  Patient, No Pcp Per  Cardiologist:  Dr. Katrinka Blazing Electrophysiologist: Dr. Ladona Ridgel   Chief Complaint: device at ERI  History of Present Illness: Alexis Clay is a 51 y.o. female with history of HTN, hypertensive heart disease, LBBB, CM, sinus node dysfunction, w/ CRT-P, chronic CHF (systolic), SVT.  She comes in today to be seen for Dr. Ladona Ridgel, last seen by him in Oct, at that time doing well.  She was noted at her remote device check last month to have reached ERI.  She is feeling well.  No CP, palpitations or SOB.   No dizziness, near syncope or syncope.  She mentions getting engaged and looking forward to the wedding.     Device information: SJM CRT-P, implanted 12/26/09, Dr. Ladona Ridgel  Past Medical History:  Diagnosis Date  . Anxiety disorder   . Biventricular cardiac pacemaker in situ   . Bradycardia   . Cardiomyopathy (HCC)   . CHF (congestive heart failure) (HCC)   . Dilated cardiomyopathy (HCC)   . Headache   . Hypertension, benign   . LBBB (left bundle branch block)   . Panic disorder without agoraphobia   . Presence of permanent cardiac pacemaker   . Sinoatrial node dysfunction (HCC)   . Sinus node dysfunction (HCC)   . Supraventricular premature beats   . SVT (supraventricular tachycardia) (HCC)   . Systolic heart failure (HCC)    Due to hypertension (LVEF 35-55% after med therapy & resynchronizatoin)    Past Surgical History:  Procedure Laterality Date  . CARDIAC CATHETERIZATION  2011   With widely patent coronary arteries   . CESAREAN SECTION  1991  . COLONOSCOPY WITH PROPOFOL N/A 02/04/2016   Procedure: COLONOSCOPY WITH PROPOFOL;  Surgeon: Charna Elizabeth, MD;  Location: [facility];  Service: Endoscopy;  Laterality: N/A;  . LEEP  10/2010  . PACEMAKER INSERTION  12/26/2009   St. Jude BiV PM.  Model # [id number].  Serial # [id number]    . TUBAL LIGATION       Current Outpatient Prescriptions  Medication Sig Dispense Refill  . aspirin 81 MG tablet Take 81 mg by mouth daily.    Marland Kitchen lisinopril-hydrochlorothiazide (PRINZIDE,ZESTORETIC) 20-25 MG tablet TAKE 1 TABLET BY MOUTH EVERY DAY 90 tablet 2  . metoprolol succinate (TOPROL-XL) 100 MG 24 hr tablet TAKE 1 TABLET BY MOUTH EVERY DAY TAKE WITH OR IMMEDIATELY FOLLOWING A MEAL 90 tablet 2   No current facility-administered medications for this visit.     Allergies:   Patient has no known allergies.   Social History:  The patient  reports that she has never smoked. She has never used smokeless tobacco. She reports that she drinks alcohol. She reports that she does not use drugs.   Family History:  The patient's family history includes CAD in her father; Cancer in her mother; Congestive Heart Failure in her father; Heart disease in her mother; Hypertension in her brother, brother, father, and sister; Renal Disease in her father.  ROS:  Please see the history of present illness.  All other systems are reviewed and otherwise negative.   PHYSICAL EXAM:  VS:  BP (!) 154/82   Pulse 61   Ht 5\' 6"  (1.676 m)   Wt 223 lb (101.2 kg)   BMI 35.99 kg/m  BMI: Body mass index is 35.99 kg/m. Well nourished, well developed, in no acute distress  HEENT: normocephalic, atraumatic  Neck:  no JVD, carotid bruits or masses Cardiac: RRR; no significant murmurs, no rubs, or gallops Lungs:  CTA b/l, no wheezing, rhonchi or rales  Abd: soft, nontender MS: no deformity or atrophy Ext: no edema  Skin: warm and dry, no rash Neuro:  No gross deficits appreciated Psych: euthymic mood, full affect  PPM site is stable, no tethering or discomfort   EKG:  Done today and reviewed by myself shows SR, V paced PPM interrogation done today by industry and reviewed by myself: battery reached ERI 05/26/17, lead measurements are good, AMS episodes all look sinus and correlate with time of day that she would be  exercising  12/12/15: TTE Study Conclusions - Left ventricle: The cavity size was normal. Wall thickness was   increased in a pattern of moderate LVH. Systolic function was   normal. The estimated ejection fraction was in the range of 50%   to 55%. - Left atrium: The atrium was mildly dilated. - Atrial septum: No defect or patent foramen ovale was identified. - Pericardium, extracardiac: A trivial pericardial effusion was   identified posterior to the heart.   Recent Labs: No results found for requested labs within last 8760 hours.  No results found for requested labs within last 8760 hours.   CrCl cannot be calculated (Patient's most recent lab result is older than the maximum 21 days allowed.).   Wt Readings from Last 3 Encounters:  06/17/17 223 lb (101.2 kg)  09/16/16 212 lb (96.2 kg)  02/04/16 213 lb (96.6 kg)     Other studies reviewed: Additional studies/records reviewed today include: summarized above  ASSESSMENT AND PLAN:  1. Hx DCM, hypertensive heart disease 2. Sinus node dysfunction 3. LBBB     S/p CRT-P     Device has reached ERI     generator change procedure, risks, benefits d/w patient, she would like to proceed     Her weight is up though this has been a slow steady rise, her exam does not suggest fluid OL     She has started exercsing routinely  4. HTN     Elevated today though she says this is unusually high for her       Disposition: schedule her for generator change with Dr. Ladona Ridgelaylor, f/u with routine post generator follow up.  Current medicines are reviewed at length with the patient today.  The patient did not have any concerns regarding medicines.  Judith BlonderSigned, Renee Ursy, PA-C 06/17/2017 4:32 PM     [facility] [address] [place] [address] [phone] (office)  [phone] (fax)

## 2017-06-17 ENCOUNTER — Encounter: Payer: Self-pay | Admitting: *Deleted

## 2017-06-17 ENCOUNTER — Ambulatory Visit (INDEPENDENT_AMBULATORY_CARE_PROVIDER_SITE_OTHER): Payer: BLUE CROSS/BLUE SHIELD | Admitting: Physician Assistant

## 2017-06-17 ENCOUNTER — Encounter (INDEPENDENT_AMBULATORY_CARE_PROVIDER_SITE_OTHER): Payer: Self-pay

## 2017-06-17 VITALS — BP 154/82 | HR 61 | Ht 66.0 in | Wt 223.0 lb

## 2017-06-17 DIAGNOSIS — I5022 Chronic systolic (congestive) heart failure: Secondary | ICD-10-CM

## 2017-06-17 DIAGNOSIS — I420 Dilated cardiomyopathy: Secondary | ICD-10-CM | POA: Diagnosis not present

## 2017-06-17 DIAGNOSIS — I447 Left bundle-branch block, unspecified: Secondary | ICD-10-CM | POA: Diagnosis not present

## 2017-06-17 DIAGNOSIS — Z950 Presence of cardiac pacemaker: Secondary | ICD-10-CM | POA: Diagnosis not present

## 2017-06-17 DIAGNOSIS — I10 Essential (primary) hypertension: Secondary | ICD-10-CM | POA: Diagnosis not present

## 2017-06-17 NOTE — Patient Instructions (Addendum)
Medication Instructions:    Your physician recommends that you continue on your current medications as directed. Please refer to the Current Medication list given to you today.   If you need a refill on your cardiac medications before your next appointment, please call your pharmacy.   Labwork: RETURN FOR LABS  NEXT WEEK     Testing/Procedures: SEE LETTER FOR GEN CHANGE  ON 07-01-2017    Follow-Up:  AFTER  07-01-2017   10 DAY WOUND CHECK   90 DAY PHYS PACER CHECK    Any Other Special Instructions Will Be Listed Below (If Applicable).

## 2017-06-21 ENCOUNTER — Other Ambulatory Visit: Payer: BLUE CROSS/BLUE SHIELD

## 2017-06-21 DIAGNOSIS — I420 Dilated cardiomyopathy: Secondary | ICD-10-CM

## 2017-06-21 LAB — BASIC METABOLIC PANEL
BUN/Creatinine Ratio: 14 (ref 9–23)
BUN: 15 mg/dL (ref 6–24)
CALCIUM: 9.3 mg/dL (ref 8.7–10.2)
CHLORIDE: 105 mmol/L (ref 96–106)
CO2: 24 mmol/L (ref 20–29)
Creatinine, Ser: 1.05 mg/dL — ABNORMAL HIGH (ref 0.57–1.00)
GFR calc non Af Amer: 62 mL/min/{1.73_m2} (ref >59)
GFR, EST AFRICAN AMERICAN: 71 mL/min/{1.73_m2} (ref >59)
GLUCOSE: 92 mg/dL (ref 65–99)
POTASSIUM: 4.3 mmol/L (ref 3.5–5.2)
Sodium: 143 mmol/L (ref 134–144)

## 2017-06-21 LAB — CBC
HEMATOCRIT: 38.4 % (ref 34.0–46.6)
HEMOGLOBIN: 12.6 g/dL (ref 11.1–15.9)
MCH: 28.9 pg (ref 26.6–33.0)
MCHC: 32.8 g/dL (ref 31.5–35.7)
MCV: 88 fL (ref 79–97)
Platelets: 268 10*3/uL (ref 150–379)
RBC: 4.36 x10E6/uL (ref 3.77–5.28)
RDW: 13.5 % (ref 12.3–15.4)
WBC: 6.5 10*3/uL (ref 3.4–10.8)

## 2017-06-21 LAB — PROTIME-INR
INR: 0.9 (ref 0.8–1.2)
PROTHROMBIN TIME: 9.9 s (ref 9.1–12.0)

## 2017-06-24 ENCOUNTER — Encounter: Payer: BLUE CROSS/BLUE SHIELD | Admitting: *Deleted

## 2017-06-24 ENCOUNTER — Telehealth: Payer: Self-pay | Admitting: Cardiology

## 2017-06-24 NOTE — Telephone Encounter (Signed)
LMOVM reminding pt to send remote transmission.   

## 2017-06-29 ENCOUNTER — Encounter: Payer: Self-pay | Admitting: Cardiology

## 2017-06-30 ENCOUNTER — Telehealth: Payer: Self-pay | Admitting: Internal Medicine

## 2017-06-30 NOTE — Telephone Encounter (Signed)
Informed patient she is to arrive at 11:30 a.m.tomorrow for a 1:30 pm procedure time. Patient verbalized understanding and agreeable to plan.

## 2017-06-30 NOTE — Telephone Encounter (Signed)
New message    Pt is calling about her scheduled procedure tomorrow. She is calling to find out when she needs to arrive. Please call.

## 2017-07-01 ENCOUNTER — Encounter (HOSPITAL_COMMUNITY)
Admission: RE | Disposition: A | Discharge status: Home / Self Care | Payer: Self-pay | Source: Ambulatory Visit | Attending: Internal Medicine

## 2017-07-01 ENCOUNTER — Ambulatory Visit (HOSPITAL_COMMUNITY)
Admission: RE | Admit: 2017-07-01 | Discharge: 2017-07-01 | Disposition: A | Discharge status: Home / Self Care | Payer: BLUE CROSS/BLUE SHIELD | Source: Ambulatory Visit | Attending: Internal Medicine | Admitting: Internal Medicine

## 2017-07-01 DIAGNOSIS — I447 Left bundle-branch block, unspecified: Secondary | ICD-10-CM | POA: Diagnosis not present

## 2017-07-01 DIAGNOSIS — I110 Hypertensive heart disease with heart failure: Secondary | ICD-10-CM | POA: Diagnosis not present

## 2017-07-01 DIAGNOSIS — I491 Atrial premature depolarization: Secondary | ICD-10-CM | POA: Insufficient documentation

## 2017-07-01 DIAGNOSIS — Z8249 Family history of ischemic heart disease and other diseases of the circulatory system: Secondary | ICD-10-CM | POA: Insufficient documentation

## 2017-07-01 DIAGNOSIS — I471 Supraventricular tachycardia: Secondary | ICD-10-CM | POA: Insufficient documentation

## 2017-07-01 DIAGNOSIS — Z45010 Encounter for checking and testing of cardiac pacemaker pulse generator [battery]: Secondary | ICD-10-CM | POA: Insufficient documentation

## 2017-07-01 DIAGNOSIS — I442 Atrioventricular block, complete: Secondary | ICD-10-CM | POA: Diagnosis not present

## 2017-07-01 DIAGNOSIS — I5022 Chronic systolic (congestive) heart failure: Secondary | ICD-10-CM | POA: Diagnosis not present

## 2017-07-01 DIAGNOSIS — Z7982 Long term (current) use of aspirin: Secondary | ICD-10-CM | POA: Diagnosis not present

## 2017-07-01 DIAGNOSIS — I495 Sick sinus syndrome: Secondary | ICD-10-CM | POA: Diagnosis not present

## 2017-07-01 DIAGNOSIS — F419 Anxiety disorder, unspecified: Secondary | ICD-10-CM | POA: Insufficient documentation

## 2017-07-01 DIAGNOSIS — I420 Dilated cardiomyopathy: Secondary | ICD-10-CM | POA: Diagnosis not present

## 2017-07-01 HISTORY — PX: BIV PACEMAKER GENERATOR CHANGEOUT: EP1198

## 2017-07-01 LAB — PREGNANCY, URINE: Preg Test, Ur: NEGATIVE

## 2017-07-01 LAB — SURGICAL PCR SCREEN
MRSA, PCR: NEGATIVE
Staphylococcus aureus: NEGATIVE

## 2017-07-01 SURGERY — BIV PACEMAKER GENERATOR CHANGEOUT
Anesthesia: LOCAL

## 2017-07-01 MED ORDER — CHLORHEXIDINE GLUCONATE 4 % EX LIQD: 60.0000 mL | Freq: Once | CUTANEOUS | Status: DC

## 2017-07-01 MED ORDER — MIDAZOLAM HCL 5 MG/5ML IJ SOLN
INTRAMUSCULAR | Status: AC
  Filled 2017-07-01: qty 5

## 2017-07-01 MED ORDER — MIDAZOLAM HCL 5 MG/5ML IJ SOLN
INTRAMUSCULAR | Status: DC | PRN
  Administered 2017-07-01 (×3): 1 mg via INTRAVENOUS

## 2017-07-01 MED ORDER — ACETAMINOPHEN 325 MG PO TABS: 325.0000 mg | ORAL_TABLET | ORAL | Status: DC | PRN

## 2017-07-01 MED ORDER — FENTANYL CITRATE (PF) 100 MCG/2ML IJ SOLN
INTRAMUSCULAR | Status: DC | PRN
  Administered 2017-07-01: 12.5 ug via INTRAVENOUS
  Administered 2017-07-01: 25 ug via INTRAVENOUS

## 2017-07-01 MED ORDER — SODIUM CHLORIDE 0.9 % IV SOLN
INTRAVENOUS | Status: DC
  Administered 2017-07-01: 13:00:00 via INTRAVENOUS

## 2017-07-01 MED ORDER — SODIUM CHLORIDE 0.9 % IR SOLN
Status: AC
  Filled 2017-07-01: qty 2

## 2017-07-01 MED ORDER — CEFAZOLIN SODIUM-DEXTROSE 2-4 GM/100ML-% IV SOLN
2.0000 g | INTRAVENOUS | Status: AC
  Administered 2017-07-01: 2 g via INTRAVENOUS

## 2017-07-01 MED ORDER — SODIUM CHLORIDE 0.9 % IR SOLN
80.0000 mg | Status: AC
  Administered 2017-07-01: 80 mg

## 2017-07-01 MED ORDER — LIDOCAINE HCL (PF) 1 % IJ SOLN
INTRAMUSCULAR | Status: AC
  Filled 2017-07-01: qty 30

## 2017-07-01 MED ORDER — LIDOCAINE HCL (PF) 1 % IJ SOLN
INTRAMUSCULAR | Status: DC | PRN
  Administered 2017-07-01: 45 mL

## 2017-07-01 MED ORDER — ONDANSETRON HCL 4 MG/2ML IJ SOLN: 4.0000 mg | Freq: Four times a day (QID) | INTRAMUSCULAR | Status: DC | PRN

## 2017-07-01 MED ORDER — MUPIROCIN 2 % EX OINT: 1.0000 "application " | TOPICAL_OINTMENT | Freq: Once | CUTANEOUS | Status: DC

## 2017-07-01 MED ORDER — MUPIROCIN 2 % EX OINT
TOPICAL_OINTMENT | CUTANEOUS | Status: AC
  Filled 2017-07-01: qty 22

## 2017-07-01 MED ORDER — FENTANYL CITRATE (PF) 100 MCG/2ML IJ SOLN
INTRAMUSCULAR | Status: AC
  Filled 2017-07-01: qty 2

## 2017-07-01 MED ORDER — CEFAZOLIN SODIUM-DEXTROSE 2-4 GM/100ML-% IV SOLN
INTRAVENOUS | Status: AC
  Filled 2017-07-01: qty 100

## 2017-07-01 SURGICAL SUPPLY — 5 items
CABLE SURGICAL S-101-97-12 (CABLE) ×2 IMPLANT
PACEMAKER ALLR CRT-P RF PM3222 (Pacemaker) ×1 IMPLANT
PAD DEFIB LIFELINK (PAD) ×2 IMPLANT
PPM ALLURE CRT-P RF PM3222 (Pacemaker) ×2 IMPLANT
TRAY PACEMAKER INSERTION (PACKS) ×2 IMPLANT

## 2017-07-01 NOTE — H&P (Signed)
Cardiology Office Note Date:  06/17/2017  Patient ID:  Alexis Clay, DOB 1966-02-02, MRN [id number] PCP:  Patient, No Pcp Per      Cardiologist:  Dr. Katrinka BlazingSmith Electrophysiologist: Dr. Ladona Ridgelaylor   Chief Complaint: device at ERI  History of Present Illness: Alexis Clay is a 51 y.o. female with history of HTN, hypertensive heart disease, LBBB, CM, sinus node dysfunction, w/ CRT-P, chronic CHF (systolic), SVT.  She comes in today to be seen for Dr. Ladona Ridgelaylor, last seen by him in Oct, at that time doing well.  She was noted at her remote device check last month to have reached ERI.  She is feeling well.  No CP, palpitations or SOB.   No dizziness, near syncope or syncope.  She mentions getting engaged and looking forward to the wedding.     Device information: SJM CRT-P, implanted 12/26/09, Dr. Ladona Ridgelaylor      Past Medical History:  Diagnosis Date  . Anxiety disorder   . Biventricular cardiac pacemaker in situ   . Bradycardia   . Cardiomyopathy (HCC)   . CHF (congestive heart failure) (HCC)   . Dilated cardiomyopathy (HCC)   . Headache   . Hypertension, benign   . LBBB (left bundle branch block)   . Panic disorder without agoraphobia   . Presence of permanent cardiac pacemaker   . Sinoatrial node dysfunction (HCC)   . Sinus node dysfunction (HCC)   . Supraventricular premature beats   . SVT (supraventricular tachycardia) (HCC)   . Systolic heart failure (HCC)    Due to hypertension (LVEF 35-55% after med therapy & resynchronizatoin)         Past Surgical History:  Procedure Laterality Date  . CARDIAC CATHETERIZATION  2011   With widely patent coronary arteries   . CESAREAN SECTION  1991  . COLONOSCOPY WITH PROPOFOL N/A 02/04/2016   Procedure: COLONOSCOPY WITH PROPOFOL;  Surgeon: Charna ElizabethJyothi Mann, MD;  Location: [facility];  Service: Endoscopy;  Laterality: N/A;  . LEEP  10/2010  . PACEMAKER INSERTION  12/26/2009   St. Jude BiV PM.  Model # [id number].   Serial # [id number]    . TUBAL LIGATION            Current Outpatient Prescriptions  Medication Sig Dispense Refill  . aspirin 81 MG tablet Take 81 mg by mouth daily.    Marland Kitchen. lisinopril-hydrochlorothiazide (PRINZIDE,ZESTORETIC) 20-25 MG tablet TAKE 1 TABLET BY MOUTH EVERY DAY 90 tablet 2  . metoprolol succinate (TOPROL-XL) 100 MG 24 hr tablet TAKE 1 TABLET BY MOUTH EVERY DAY TAKE WITH OR IMMEDIATELY FOLLOWING A MEAL 90 tablet 2   No current facility-administered medications for this visit.     Allergies:   Patient has no known allergies.   Social History:  The patient  reports that she has never smoked. She has never used smokeless tobacco. She reports that she drinks alcohol. She reports that she does not use drugs.   Family History:  The patient's family history includes CAD in her father; Cancer in her mother; Congestive Heart Failure in her father; Heart disease in her mother; Hypertension in her brother, brother, father, and sister; Renal Disease in her father.  ROS:  Please see the history of present illness.  All other systems are reviewed and otherwise negative.   PHYSICAL EXAM:  VS:  BP (!) 154/82   Pulse 61   Ht 5\' 6"  (1.676 m)   Wt 223 lb (101.2 kg)   BMI 35.99 kg/m  BMI: Body mass index is 35.99 kg/m. Well nourished, well developed, in no acute distress  HEENT: normocephalic, atraumatic  Neck: no JVD, carotid bruits or masses Cardiac: RRR; no significant murmurs, no rubs, or gallops Lungs:  CTA b/l, no wheezing, rhonchi or rales  Abd: soft, nontender MS: no deformity or atrophy Ext: no edema  Skin: warm and dry, no rash Neuro:  No gross deficits appreciated Psych: euthymic mood, full affect  PPM site is stable, no tethering or discomfort   EKG:  Done today and reviewed by myself shows SR, V paced PPM interrogation done today by industry and reviewed by myself: battery reached ERI 05/26/17, lead measurements are good, AMS episodes all look sinus and  correlate with time of day that she would be exercising  12/12/15: TTE Study Conclusions - Left ventricle: The cavity size was normal. Wall thickness was increased in a pattern of moderate LVH. Systolic function was normal. The estimated ejection fraction was in the range of 50% to 55%. - Left atrium: The atrium was mildly dilated. - Atrial septum: No defect or patent foramen ovale was identified. - Pericardium, extracardiac: A trivial pericardial effusion was identified posterior to the heart.   Recent Labs: No results found for requested labs within last 8760 hours.  No results found for requested labs within last 8760 hours.   CrCl cannot be calculated (Patient's most recent lab result is older than the maximum 21 days allowed.).   Wt Readings from Last 3 Encounters:  06/17/17 223 lb (101.2 kg)  09/16/16 212 lb (96.2 kg)  02/04/16 213 lb (96.6 kg)     Other studies reviewed: Additional studies/records reviewed today include: summarized above  ASSESSMENT AND PLAN:  1. Hx DCM, hypertensive heart disease 2. Sinus node dysfunction 3. LBBB     S/p CRT-P     Device has reached ERI     generator change procedure, risks, benefits d/w patient, she would like to proceed     Her weight is up though this has been a slow steady rise, her exam does not suggest fluid OL     She has started exercsing routinely  4. HTN     Elevated today though she says this is unusually high for her       Disposition: schedule her for generator change with Dr. Ladona Ridgel, f/u with routine post generator follow up.  Current medicines are reviewed at length with the patient today.  The patient did not have any concerns regarding medicines.  Judith Blonder, PA-C 06/17/2017 4:32 PM  EP Attending  Patient seen and examined. I have recommended we proceed with BiV PM gen change. I have discussed the risks/benefits/goals/expectations of the procedure and she wishes to  proceed.  Leonia Reeves.D.

## 2017-07-01 NOTE — Discharge Instructions (Signed)

## 2017-07-02 ENCOUNTER — Encounter (HOSPITAL_COMMUNITY): Payer: Self-pay | Admitting: Internal Medicine

## 2017-07-02 MED FILL — Gentamicin Sulfate Inj 40 MG/ML: INTRAMUSCULAR | Qty: 2 | Status: AC

## 2017-07-02 MED FILL — Sodium Chloride Irrigation Soln 0.9%: Qty: 500 | Status: AC

## 2017-07-05 ENCOUNTER — Telehealth: Payer: Self-pay | Admitting: Internal Medicine

## 2017-07-05 NOTE — Telephone Encounter (Signed)
New Message    Needs to speak to Dr Ladona Ridgelaylor about a return to work notes, she had surgery on last Thursday

## 2017-07-05 NOTE — Telephone Encounter (Signed)
Spoke with patient who was requesting return to work letter s/p gen change on 7/20. Note made to have work note ready at wound check on 7/30 for return to work on 7/31. Patient verbalized understanding.

## 2017-07-12 ENCOUNTER — Ambulatory Visit (INDEPENDENT_AMBULATORY_CARE_PROVIDER_SITE_OTHER): Payer: BLUE CROSS/BLUE SHIELD | Admitting: *Deleted

## 2017-07-12 ENCOUNTER — Encounter: Payer: Self-pay | Admitting: Internal Medicine

## 2017-07-12 DIAGNOSIS — I5022 Chronic systolic (congestive) heart failure: Secondary | ICD-10-CM

## 2017-07-12 DIAGNOSIS — Z950 Presence of cardiac pacemaker: Secondary | ICD-10-CM

## 2017-07-12 LAB — CUP PACEART INCLINIC DEVICE CHECK
Brady Statistic RA Percent Paced: 2.2 %
Date Time Interrogation Session: 20180730113125
Implantable Lead Implant Date: 20110113
Implantable Lead Location: 753859
Implantable Pulse Generator Implant Date: 20180719
Lead Channel Pacing Threshold Amplitude: 0.75 V
Lead Channel Pacing Threshold Amplitude: 0.75 V
Lead Channel Pacing Threshold Amplitude: 0.75 V
Lead Channel Pacing Threshold Amplitude: 1.25 V
Lead Channel Pacing Threshold Pulse Width: 0.4 ms
Lead Channel Pacing Threshold Pulse Width: 0.4 ms
Lead Channel Pacing Threshold Pulse Width: 0.4 ms
Lead Channel Sensing Intrinsic Amplitude: 12 mV
Lead Channel Sensing Intrinsic Amplitude: 2.6 mV
Lead Channel Setting Pacing Amplitude: 1.625
Lead Channel Setting Pacing Pulse Width: 0.4 ms
Lead Channel Setting Pacing Pulse Width: 0.4 ms
MDC IDC LEAD IMPLANT DT: 20110113
MDC IDC LEAD IMPLANT DT: 20110113
MDC IDC LEAD LOCATION: 753858
MDC IDC LEAD LOCATION: 753860
MDC IDC MSMT BATTERY REMAINING LONGEVITY: 90 mo
MDC IDC MSMT BATTERY VOLTAGE: 2.98 V
MDC IDC MSMT LEADCHNL LV IMPEDANCE VALUE: 512.5 Ohm
MDC IDC MSMT LEADCHNL RA IMPEDANCE VALUE: 512.5 Ohm
MDC IDC MSMT LEADCHNL RA PACING THRESHOLD AMPLITUDE: 0.75 V
MDC IDC MSMT LEADCHNL RA PACING THRESHOLD PULSEWIDTH: 0.4 ms
MDC IDC MSMT LEADCHNL RV IMPEDANCE VALUE: 275 Ohm
MDC IDC MSMT LEADCHNL RV PACING THRESHOLD AMPLITUDE: 1.25 V
MDC IDC MSMT LEADCHNL RV PACING THRESHOLD PULSEWIDTH: 0.4 ms
MDC IDC MSMT LEADCHNL RV PACING THRESHOLD PULSEWIDTH: 0.4 ms
MDC IDC PG SERIAL: 8007164
MDC IDC SET LEADCHNL LV PACING AMPLITUDE: 2 V
MDC IDC SET LEADCHNL RV PACING AMPLITUDE: 2 V
MDC IDC SET LEADCHNL RV SENSING SENSITIVITY: 2 mV
MDC IDC STAT BRADY RV PERCENT PACED: 99.94 %

## 2017-07-12 NOTE — Progress Notes (Signed)
Wound check appointment. Steri-strips removed. Wound without redness or edema. Incision edges approximated, wound well healed. Normal device function. Thresholds, sensing, impedance consistent with implant measurements. Histograms appropriate for patient and level of activity. Outputs programmed at chronic values. No mode switches or ventricular high rate episodes. Patient bi-ventricularly pacing 99% of the time. Device programmed with appropriate safety margins. Device heart failure diagnostics are within normal limits and stable over time. Estimated longevity 7.4 years. Pt educated about wound care and arm mobility. ROV w/ GT 10/06/2017

## 2017-07-21 ENCOUNTER — Other Ambulatory Visit: Payer: Self-pay | Admitting: Internal Medicine

## 2017-08-31 ENCOUNTER — Ambulatory Visit: Payer: BLUE CROSS/BLUE SHIELD | Admitting: Interventional Cardiology

## 2017-09-09 DIAGNOSIS — J302 Other seasonal allergic rhinitis: Secondary | ICD-10-CM | POA: Diagnosis not present

## 2017-09-09 DIAGNOSIS — J069 Acute upper respiratory infection, unspecified: Secondary | ICD-10-CM | POA: Diagnosis not present

## 2017-10-06 ENCOUNTER — Ambulatory Visit: Payer: BLUE CROSS/BLUE SHIELD | Admitting: Internal Medicine

## 2017-10-06 ENCOUNTER — Encounter: Payer: Self-pay | Admitting: Internal Medicine

## 2017-10-06 VITALS — BP 142/98 | HR 90 | Ht 66.0 in | Wt 227.0 lb

## 2017-10-06 DIAGNOSIS — I495 Sick sinus syndrome: Secondary | ICD-10-CM

## 2017-10-06 DIAGNOSIS — Z950 Presence of cardiac pacemaker: Secondary | ICD-10-CM

## 2017-10-06 DIAGNOSIS — I5022 Chronic systolic (congestive) heart failure: Secondary | ICD-10-CM

## 2017-10-06 NOTE — Progress Notes (Signed)
HPI [name] returns today after undergoing BiV PPM insertion about 3 months ago (gen change). She has done well in the interim. No chest pain or sob. No syncope. She is back to work and doing well. She has very mild soreness over her PPM insertion site. No Known Allergies   Current Outpatient Prescriptions  Medication Sig Dispense Refill  . aspirin EC 81 MG tablet Take 81 mg by mouth daily.    Marland Kitchen. lisinopril-hydrochlorothiazide (PRINZIDE,ZESTORETIC) 20-25 MG tablet TAKE 1 TABLET BY MOUTH EVERY DAY 90 tablet 2  . metoprolol succinate (TOPROL-XL) 100 MG 24 hr tablet TAKE 1 TABLET BY MOUTH EVERY DAY TAKE WITH OR IMMEDIATELY FOLLOWING A MEAL 90 tablet 2   No current facility-administered medications for this visit.      Past Medical History:  Diagnosis Date  . Anxiety disorder   . Biventricular cardiac pacemaker in situ   . Bradycardia   . Cardiomyopathy (HCC)   . CHF (congestive heart failure) (HCC)   . Dilated cardiomyopathy (HCC)   . Headache   . Hypertension, benign   . LBBB (left bundle branch block)   . Panic disorder without agoraphobia   . Presence of permanent cardiac pacemaker   . Sinoatrial node dysfunction (HCC)   . Sinus node dysfunction (HCC)   . Supraventricular premature beats   . SVT (supraventricular tachycardia) (HCC)   . Systolic heart failure (HCC)    Due to hypertension (LVEF 35-55% after med therapy & resynchronizatoin)    ROS:   All systems reviewed and negative except as noted in the HPI.   Past Surgical History:  Procedure Laterality Date  . BIV PACEMAKER GENERATOR CHANGEOUT N/A 07/01/2017   Procedure: BiV Pacemaker Generator Changeout;  Surgeon: Marinus Mawaylor, Sybilla Malhotra W, MD;  [facility];  Service: Cardiovascular;  Laterality: N/A;  . CARDIAC CATHETERIZATION  2011   With widely patent coronary arteries   . CESAREAN SECTION  1991  . COLONOSCOPY WITH PROPOFOL N/A 02/04/2016   Procedure: COLONOSCOPY WITH PROPOFOL;  Surgeon: Charna ElizabethJyothi  Mann, MD;  Location: [facility];  Service: Endoscopy;  Laterality: N/A;  . LEEP  10/2010  . PACEMAKER INSERTION  12/26/2009   St. Jude BiV PM.  Model # [id number].  Serial # [id number]    . TUBAL LIGATION       Family History  Problem Relation Age of Onset  . Cancer Mother        cervical  . Heart disease Mother        CABGx3 in 2014  . CAD Father   . Congestive Heart Failure Father   . Hypertension Father   . Renal Disease Father        CKD, ESRD  . Hypertension Brother   . Hypertension Brother   . Hypertension Sister      Social History   Social History  . Marital status: Divorced    Spouse name: N/A  . Number of children: N/A  . Years of education: N/A   Occupational History  . Not on file.   Social History Main Topics  . Smoking status: Never Smoker  . Smokeless tobacco: Never Used  . Alcohol use Yes     Comment: occasionally  . Drug use: No  . Sexual activity: Not on file   Other Topics Concern  . Not on file   Social History Narrative  . No narrative on file     BP (!) 142/98   Pulse 90  Ht 5\' 6"  (1.676 m)   Wt 227 lb (103 kg)   BMI 36.64 kg/m   Physical Exam:  Well appearing 51 yo woman, NAD HEENT: Unremarkable Neck:  6 cm JVD, no thyromegally Lymphatics:  No adenopathy Back:  No CVA tenderness Lungs:  Clear with no wheezes HEART:  Regular rate rhythm, no murmurs, no rubs, no clicks Abd:  soft, positive bowel sounds, no organomegally, no rebound, no guarding Ext:  2 plus pulses, no edema, no cyanosis, no clubbing Skin:  No rashes no nodules Neuro:  CN II through XII intact, motor grossly intact  EKG - NSR with biv pacing  DEVICE  Normal device function.  See PaceArt for details.   Assess/Plan: 1. Chronic systolic heart failure - her symptoms are class 2. She is s/p biv ppm. She will continue her current meds. 2. PM - her St. Jude DDD PM is working normally. Will recheck in several months. 3. Obesity - she is encouraged to lose weight.    Leonia Reeves.D.

## 2017-10-06 NOTE — Patient Instructions (Signed)
Medication Instructions:  Your physician recommends that you continue on your current medications as directed. Please refer to the Current Medication list given to you today.  Labwork: None ordered.  Testing/Procedures: None ordered.  Follow-Up: Your physician wants you to follow-up in: 9 months with Dr. Taylor.   You will receive a reminder letter in the mail two months in advance. If you don't receive a letter, please call our office to schedule the follow-up appointment.  Remote monitoring is used to monitor your Pacemaker from home. This monitoring reduces the number of office visits required to check your device to one time per year. It allows us to keep an eye on the functioning of your device to ensure it is working properly. You are scheduled for a device check from home on 01/05/2017. You may send your transmission at any time that day. If you have a wireless device, the transmission will be sent automatically. After your physician reviews your transmission, you will receive a postcard with your next transmission date.    Any Other Special Instructions Will Be Listed Below (If Applicable).     If you need a refill on your cardiac medications before your next appointment, please call your pharmacy.   

## 2017-10-19 LAB — CUP PACEART INCLINIC DEVICE CHECK
Battery Remaining Longevity: 88 mo
Battery Voltage: 2.99 V
Brady Statistic RV Percent Paced: 99.81 %
Date Time Interrogation Session: 20181024203327
Implantable Lead Implant Date: 20110113
Implantable Lead Implant Date: 20110113
Implantable Lead Location: 753858
Implantable Lead Location: 753860
Implantable Pulse Generator Implant Date: 20180719
Lead Channel Impedance Value: 275 Ohm
Lead Channel Impedance Value: 512.5 Ohm
Lead Channel Impedance Value: 575 Ohm
Lead Channel Pacing Threshold Pulse Width: 0.4 ms
Lead Channel Pacing Threshold Pulse Width: 0.4 ms
Lead Channel Pacing Threshold Pulse Width: 0.4 ms
Lead Channel Sensing Intrinsic Amplitude: 3.5 mV
Lead Channel Setting Pacing Amplitude: 1.625
Lead Channel Setting Sensing Sensitivity: 2 mV
MDC IDC LEAD IMPLANT DT: 20110113
MDC IDC LEAD LOCATION: 753859
MDC IDC MSMT LEADCHNL LV PACING THRESHOLD AMPLITUDE: 0.75 V
MDC IDC MSMT LEADCHNL LV PACING THRESHOLD AMPLITUDE: 0.75 V
MDC IDC MSMT LEADCHNL RA PACING THRESHOLD AMPLITUDE: 0.75 V
MDC IDC MSMT LEADCHNL RV PACING THRESHOLD AMPLITUDE: 0.75 V
MDC IDC MSMT LEADCHNL RV PACING THRESHOLD PULSEWIDTH: 0.4 ms
MDC IDC MSMT LEADCHNL RV SENSING INTR AMPL: 12 mV
MDC IDC PG SERIAL: 8007164
MDC IDC SET LEADCHNL LV PACING AMPLITUDE: 2 V
MDC IDC SET LEADCHNL LV PACING PULSEWIDTH: 0.4 ms
MDC IDC SET LEADCHNL RV PACING AMPLITUDE: 2 V
MDC IDC SET LEADCHNL RV PACING PULSEWIDTH: 0.4 ms
MDC IDC STAT BRADY RA PERCENT PACED: 2 %
Pulse Gen Model: 3222

## 2017-10-22 ENCOUNTER — Other Ambulatory Visit: Payer: Self-pay | Admitting: Interventional Cardiology

## 2017-10-22 DIAGNOSIS — I5022 Chronic systolic (congestive) heart failure: Secondary | ICD-10-CM

## 2017-10-22 NOTE — Telephone Encounter (Signed)
Patient has not seen Dr Katrinka BlazingSmith since 2016 but has been seeing Dr Ladona Ridgelaylor. Will Dr Ladona Ridgelaylor refill these for the patient? Please advise. Thanks, MI

## 2017-10-25 NOTE — Telephone Encounter (Signed)
Yes, ok to refill 

## 2017-12-15 DIAGNOSIS — Z23 Encounter for immunization: Secondary | ICD-10-CM | POA: Diagnosis not present

## 2018-01-05 ENCOUNTER — Ambulatory Visit (INDEPENDENT_AMBULATORY_CARE_PROVIDER_SITE_OTHER): Payer: BLUE CROSS/BLUE SHIELD | Admitting: *Deleted

## 2018-01-05 DIAGNOSIS — I495 Sick sinus syndrome: Secondary | ICD-10-CM

## 2018-01-05 NOTE — Progress Notes (Signed)
Remote pacemaker transmission.   

## 2018-01-06 ENCOUNTER — Encounter: Payer: Self-pay | Admitting: Cardiology

## 2018-01-31 LAB — CUP PACEART REMOTE DEVICE CHECK
Battery Remaining Percentage: 95.5 %
Brady Statistic AP VP Percent: 1.7 %
Brady Statistic AS VP Percent: 98 %
Brady Statistic RA Percent Paced: 1.6 %
Implantable Lead Implant Date: 20110113
Implantable Lead Implant Date: 20110113
Implantable Lead Location: 753858
Lead Channel Impedance Value: 260 Ohm
Lead Channel Pacing Threshold Amplitude: 0.875 V
Lead Channel Pacing Threshold Amplitude: 1.375 V
Lead Channel Pacing Threshold Pulse Width: 0.4 ms
Lead Channel Pacing Threshold Pulse Width: 0.4 ms
Lead Channel Pacing Threshold Pulse Width: 0.4 ms
Lead Channel Sensing Intrinsic Amplitude: 1.2 mV
Lead Channel Setting Pacing Amplitude: 2 V
Lead Channel Setting Pacing Amplitude: 2.375
Lead Channel Setting Sensing Sensitivity: 2 mV
MDC IDC LEAD IMPLANT DT: 20110113
MDC IDC LEAD LOCATION: 753859
MDC IDC LEAD LOCATION: 753860
MDC IDC MSMT BATTERY REMAINING LONGEVITY: 94 mo
MDC IDC MSMT BATTERY VOLTAGE: 2.99 V
MDC IDC MSMT LEADCHNL LV IMPEDANCE VALUE: 510 Ohm
MDC IDC MSMT LEADCHNL RA IMPEDANCE VALUE: 460 Ohm
MDC IDC MSMT LEADCHNL RA PACING THRESHOLD AMPLITUDE: 0.625 V
MDC IDC MSMT LEADCHNL RV SENSING INTR AMPL: 12 mV
MDC IDC PG IMPLANT DT: 20180719
MDC IDC SESS DTM: 20190123071645
MDC IDC SET LEADCHNL LV PACING PULSEWIDTH: 0.4 ms
MDC IDC SET LEADCHNL RA PACING AMPLITUDE: 1.625
MDC IDC SET LEADCHNL RV PACING PULSEWIDTH: 0.4 ms
MDC IDC STAT BRADY AP VS PERCENT: 1 %
MDC IDC STAT BRADY AS VS PERCENT: 1 %
Pulse Gen Model: 3222
Pulse Gen Serial Number: 8007164

## 2018-03-08 DIAGNOSIS — K210 Gastro-esophageal reflux disease with esophagitis: Secondary | ICD-10-CM | POA: Diagnosis not present

## 2018-03-08 DIAGNOSIS — K219 Gastro-esophageal reflux disease without esophagitis: Secondary | ICD-10-CM | POA: Diagnosis not present

## 2018-03-17 DIAGNOSIS — Z0000 Encounter for general adult medical examination without abnormal findings: Secondary | ICD-10-CM | POA: Diagnosis not present

## 2018-04-06 ENCOUNTER — Ambulatory Visit (INDEPENDENT_AMBULATORY_CARE_PROVIDER_SITE_OTHER): Payer: BLUE CROSS/BLUE SHIELD | Admitting: *Deleted

## 2018-04-06 ENCOUNTER — Telehealth: Payer: Self-pay | Admitting: Cardiology

## 2018-04-06 DIAGNOSIS — I495 Sick sinus syndrome: Secondary | ICD-10-CM | POA: Diagnosis not present

## 2018-04-06 NOTE — Telephone Encounter (Signed)
Spoke with pt and reminded pt of remote transmission that is due today. Pt verbalized understanding.   

## 2018-04-08 ENCOUNTER — Encounter: Payer: Self-pay | Admitting: Cardiology

## 2018-04-08 NOTE — Progress Notes (Signed)
Remote pacemaker transmission.   

## 2018-04-11 LAB — CUP PACEART REMOTE DEVICE CHECK
Implantable Lead Implant Date: 20110113
Implantable Lead Implant Date: 20110113
Implantable Lead Location: 753858
Implantable Lead Location: 753860
Implantable Pulse Generator Implant Date: 20180719
MDC IDC LEAD IMPLANT DT: 20110113
MDC IDC LEAD LOCATION: 753859
MDC IDC SESS DTM: 20190429094508
Pulse Gen Model: 3222
Pulse Gen Serial Number: 8007164

## 2018-06-13 DIAGNOSIS — Z01419 Encounter for gynecological examination (general) (routine) without abnormal findings: Secondary | ICD-10-CM | POA: Diagnosis not present

## 2018-06-13 DIAGNOSIS — Z6836 Body mass index (BMI) 36.0-36.9, adult: Secondary | ICD-10-CM | POA: Diagnosis not present

## 2018-06-13 DIAGNOSIS — Z1231 Encounter for screening mammogram for malignant neoplasm of breast: Secondary | ICD-10-CM | POA: Diagnosis not present

## 2018-07-06 ENCOUNTER — Ambulatory Visit (INDEPENDENT_AMBULATORY_CARE_PROVIDER_SITE_OTHER): Payer: BLUE CROSS/BLUE SHIELD | Admitting: *Deleted

## 2018-07-06 ENCOUNTER — Telehealth: Payer: Self-pay | Admitting: Cardiology

## 2018-07-06 DIAGNOSIS — I495 Sick sinus syndrome: Secondary | ICD-10-CM

## 2018-07-06 NOTE — Telephone Encounter (Signed)
Spoke with pt and reminded pt of remote transmission that is due today. Pt verbalized understanding.   

## 2018-07-07 ENCOUNTER — Encounter: Payer: Self-pay | Admitting: Cardiology

## 2018-07-07 NOTE — Progress Notes (Signed)
Remote pacemaker transmission.   

## 2018-08-15 LAB — CUP PACEART REMOTE DEVICE CHECK
Battery Remaining Longevity: 95 mo
Battery Voltage: 2.99 V
Brady Statistic AP VS Percent: 1 %
Brady Statistic AS VP Percent: 97 %
Brady Statistic RA Percent Paced: 2.2 %
Date Time Interrogation Session: 20190724223706
Implantable Lead Implant Date: 20110113
Implantable Lead Implant Date: 20110113
Implantable Lead Implant Date: 20110113
Implantable Lead Location: 753858
Lead Channel Impedance Value: 230 Ohm
Lead Channel Impedance Value: 430 Ohm
Lead Channel Pacing Threshold Amplitude: 0.75 V
Lead Channel Pacing Threshold Amplitude: 0.875 V
Lead Channel Pacing Threshold Pulse Width: 0.4 ms
Lead Channel Pacing Threshold Pulse Width: 0.4 ms
Lead Channel Sensing Intrinsic Amplitude: 1.3 mV
Lead Channel Sensing Intrinsic Amplitude: 12 mV
Lead Channel Setting Pacing Amplitude: 1.75 V
Lead Channel Setting Pacing Amplitude: 2 V
Lead Channel Setting Pacing Pulse Width: 0.4 ms
MDC IDC LEAD LOCATION: 753859
MDC IDC LEAD LOCATION: 753860
MDC IDC MSMT BATTERY REMAINING PERCENTAGE: 95.5 %
MDC IDC MSMT LEADCHNL LV IMPEDANCE VALUE: 480 Ohm
MDC IDC MSMT LEADCHNL RV PACING THRESHOLD AMPLITUDE: 1 V
MDC IDC MSMT LEADCHNL RV PACING THRESHOLD PULSEWIDTH: 0.4 ms
MDC IDC PG IMPLANT DT: 20180719
MDC IDC SET LEADCHNL LV PACING AMPLITUDE: 2 V
MDC IDC SET LEADCHNL RV PACING PULSEWIDTH: 0.4 ms
MDC IDC SET LEADCHNL RV SENSING SENSITIVITY: 2 mV
MDC IDC STAT BRADY AP VP PERCENT: 2.3 %
MDC IDC STAT BRADY AS VS PERCENT: 1 %
Pulse Gen Model: 3222
Pulse Gen Serial Number: 8007164

## 2018-10-05 ENCOUNTER — Telehealth: Payer: Self-pay

## 2018-10-05 ENCOUNTER — Encounter: Payer: BLUE CROSS/BLUE SHIELD | Admitting: *Deleted

## 2018-10-05 NOTE — Telephone Encounter (Signed)
Spoke with pt and reminded pt of remote transmission that is due today. Pt verbalized understanding.   

## 2018-10-06 ENCOUNTER — Encounter: Payer: Self-pay | Admitting: Cardiology

## 2018-10-25 ENCOUNTER — Other Ambulatory Visit: Payer: Self-pay | Admitting: Internal Medicine

## 2018-10-25 DIAGNOSIS — I5022 Chronic systolic (congestive) heart failure: Secondary | ICD-10-CM

## 2018-11-09 ENCOUNTER — Ambulatory Visit: Payer: BLUE CROSS/BLUE SHIELD | Admitting: Internal Medicine

## 2018-11-09 ENCOUNTER — Encounter: Payer: Self-pay | Admitting: Internal Medicine

## 2018-11-09 VITALS — BP 126/80 | HR 71 | Ht 66.0 in | Wt 229.0 lb

## 2018-11-09 DIAGNOSIS — I5022 Chronic systolic (congestive) heart failure: Secondary | ICD-10-CM

## 2018-11-09 DIAGNOSIS — Z950 Presence of cardiac pacemaker: Secondary | ICD-10-CM | POA: Diagnosis not present

## 2018-11-09 DIAGNOSIS — I495 Sick sinus syndrome: Secondary | ICD-10-CM | POA: Diagnosis not present

## 2018-11-09 DIAGNOSIS — I447 Left bundle-branch block, unspecified: Secondary | ICD-10-CM

## 2018-11-09 NOTE — Patient Instructions (Signed)
Medication Instructions:  Your physician recommends that you continue on your current medications as directed. Please refer to the Current Medication list given to you today.  Labwork: None ordered.  Testing/Procedures: None ordered.  Follow-Up: Your physician wants you to follow-up in: one year with Dr. Ladona Ridgelaylor.   You will receive a reminder letter in the mail two months in advance. If you don't receive a letter, please call our office to schedule the follow-up appointment.  Remote monitoring is used to monitor your Pacemaker from home. This monitoring reduces the number of office visits required to check your device to one time per year. It allows us to keep an eye on the functioning of your device to ensure it is working properly. You are scheduled for a device check from home on 01/19/2019. You may send your transmission at any time that day. If you have a wireless device, the transmission will be sent automatically. After your physician reviews your transmission, you will receive a postcard with your next transmission date.  Any Other Special Instructions Will Be Listed Below (If Applicable).  If you need a refill on your cardiac medications before your next appointment, please call your pharmacy.

## 2018-11-09 NOTE — Progress Notes (Signed)
HPI [name] returns today after undergoing BiV PPM insertion about 15 months ago (gen change). She has done well in the interim. No chest pain or sob. No syncope. She is back to work and doing well. She admits to some dietary indiscretion since she got married.  No Known Allergies   Current Outpatient Medications  Medication Sig Dispense Refill  . aspirin EC 81 MG tablet Take 81 mg by mouth daily.    Marland Kitchen lisinopril-hydrochlorothiazide (PRINZIDE,ZESTORETIC) 20-25 MG tablet TAKE 1 TABLET BY MOUTH EVERY DAY 90 tablet 0  . metoprolol succinate (TOPROL-XL) 100 MG 24 hr tablet TAKE 1 TABLET BY MOUTH EVERY DAY TAKE WITH OR IMMEDIATELY FOLLOWING A MEAL 90 tablet 0   No current facility-administered medications for this visit.      Past Medical History:  Diagnosis Date  . Anxiety disorder   . Biventricular cardiac pacemaker in situ   . Bradycardia   . Cardiomyopathy (HCC)   . CHF (congestive heart failure) (HCC)   . Dilated cardiomyopathy (HCC)   . Headache   . Hypertension, benign   . LBBB (left bundle branch block)   . Panic disorder without agoraphobia   . Presence of permanent cardiac pacemaker   . Sinoatrial node dysfunction (HCC)   . Sinus node dysfunction (HCC)   . Supraventricular premature beats   . SVT (supraventricular tachycardia) (HCC)   . Systolic heart failure (HCC)    Due to hypertension (LVEF 35-55% after med therapy & resynchronizatoin)    ROS:   All systems reviewed and negative except as noted in the HPI.   Past Surgical History:  Procedure Laterality Date  . BIV PACEMAKER GENERATOR CHANGEOUT N/A 07/01/2017   Procedure: BiV Pacemaker Generator Changeout;  Surgeon: Marinus Maw, MD;  Location: [facility] INVASIVE CV LAB;  Service: Cardiovascular;  Laterality: N/A;  . CARDIAC CATHETERIZATION  2011   With widely patent coronary arteries   . CESAREAN SECTION  1991  . COLONOSCOPY WITH PROPOFOL N/A 02/04/2016   Procedure: COLONOSCOPY WITH PROPOFOL;   Surgeon: Charna Elizabeth, MD;  Location: [facility] ENDOSCOPY;  Service: Endoscopy;  Laterality: N/A;  . LEEP  10/2010  . PACEMAKER INSERTION  12/26/2009   St. Jude BiV PM.  Model # [id number].  Serial # [id number]    . TUBAL LIGATION       Family History  Problem Relation Age of Onset  . Cancer Mother        cervical  . Heart disease Mother        CABGx3 in 2014  . CAD Father   . Congestive Heart Failure Father   . Hypertension Father   . Renal Disease Father        CKD, ESRD  . Hypertension Brother   . Hypertension Brother   . Hypertension Sister      Social History   Socioeconomic History  . Marital status: Divorced    Spouse name: Not on file  . Number of children: Not on file  . Years of education: Not on file  . Highest education level: Not on file  Occupational History  . Not on file  Social Needs  . Financial resource strain: Not on file  . Food insecurity:    Worry: Not on file    Inability: Not on file  . Transportation needs:    Medical: Not on file    Non-medical: Not on file  Tobacco Use  . Smoking status: Never Smoker  . Smokeless tobacco:  Never Used  Substance and Sexual Activity  . Alcohol use: Yes    Comment: occasionally  . Drug use: No  . Sexual activity: Not on file  Lifestyle  . Physical activity:    Days per week: Not on file    Minutes per session: Not on file  . Stress: Not on file  Relationships  . Social connections:    Talks on phone: Not on file    Gets together: Not on file    Attends religious service: Not on file    Active member of club or organization: Not on file    Attends meetings of clubs or organizations: Not on file    Relationship status: Not on file  . Intimate partner violence:    Fear of current or ex partner: Not on file    Emotionally abused: Not on file    Physically abused: Not on file    Forced sexual activity: Not on file  Other Topics Concern  . Not on file  Social History Narrative  . Not on file     BP 126/80    Pulse 71   Ht 5\' 6"  (1.676 m)   Wt 229 lb (103.9 kg)   BMI 36.96 kg/m   Physical Exam:  Well appearing NAD HEENT: Unremarkable Neck:  No JVD, no thyromegally Lymphatics:  No adenopathy Back:  No CVA tenderness Lungs:  Clear with no wheezes HEART:  Regular rate rhythm, no murmurs, no rubs, no clicks Abd:  soft, positive bowel sounds, no organomegally, no rebound, no guarding Ext:  2 plus pulses, no edema, no cyanosis, no clubbing Skin:  No rashes no nodules Neuro:  CN II through XII intact, motor grossly intact  EKG - NSR with biv pacing  DEVICE  Normal device function.  See PaceArt for details.   Assess/Plan: 1. Chronic systolic heart failure - her symptoms are class 1. She will continue her current meds. 2. BIV PPM - her St. Jude device is working normally. We will recheck in several months. 3. Obesity - I have encouraged the patient to lose weight.   Leonia ReevesGregg Henriette Hesser,M.D.

## 2018-11-12 LAB — CUP PACEART INCLINIC DEVICE CHECK
Brady Statistic RA Percent Paced: 2.2 %
Brady Statistic RV Percent Paced: 99.7 %
Date Time Interrogation Session: 20191127145910
Implantable Lead Implant Date: 20110113
Implantable Lead Location: 753858
Lead Channel Impedance Value: 262.5 Ohm
Lead Channel Impedance Value: 462.5 Ohm
Lead Channel Pacing Threshold Amplitude: 1.125 V
Lead Channel Pacing Threshold Pulse Width: 0.4 ms
Lead Channel Sensing Intrinsic Amplitude: 12 mV
Lead Channel Setting Pacing Amplitude: 2 V
Lead Channel Setting Pacing Amplitude: 2.125
Lead Channel Setting Pacing Pulse Width: 0.4 ms
MDC IDC LEAD IMPLANT DT: 20110113
MDC IDC LEAD IMPLANT DT: 20110113
MDC IDC LEAD LOCATION: 753859
MDC IDC LEAD LOCATION: 753860
MDC IDC MSMT BATTERY REMAINING LONGEVITY: 91 mo
MDC IDC MSMT BATTERY VOLTAGE: 2.99 V
MDC IDC MSMT LEADCHNL LV IMPEDANCE VALUE: 537.5 Ohm
MDC IDC MSMT LEADCHNL LV PACING THRESHOLD AMPLITUDE: 0.75 V
MDC IDC MSMT LEADCHNL LV PACING THRESHOLD PULSEWIDTH: 0.4 ms
MDC IDC MSMT LEADCHNL RA PACING THRESHOLD AMPLITUDE: 0.75 V
MDC IDC MSMT LEADCHNL RA PACING THRESHOLD PULSEWIDTH: 0.4 ms
MDC IDC MSMT LEADCHNL RA SENSING INTR AMPL: 3 mV
MDC IDC PG IMPLANT DT: 20180719
MDC IDC SET LEADCHNL RA PACING AMPLITUDE: 1.75 V
MDC IDC SET LEADCHNL RV PACING PULSEWIDTH: 0.4 ms
MDC IDC SET LEADCHNL RV SENSING SENSITIVITY: 2 mV
Pulse Gen Model: 3222
Pulse Gen Serial Number: 8007164

## 2019-01-11 ENCOUNTER — Telehealth: Payer: Self-pay

## 2019-01-11 NOTE — Telephone Encounter (Signed)
Unable to LVM, pt scheduled for apt with Gypsy Balsam on 01/16/2019 at 8:20 am to make pacemaker adjustments per GT. Per GT change RA sensitivity from 0.58mV to 0.66mV. Will also send a MyChart msg.

## 2019-01-18 ENCOUNTER — Other Ambulatory Visit: Payer: Self-pay | Admitting: Internal Medicine

## 2019-01-27 ENCOUNTER — Other Ambulatory Visit: Payer: Self-pay | Admitting: Internal Medicine

## 2019-01-27 DIAGNOSIS — I5022 Chronic systolic (congestive) heart failure: Secondary | ICD-10-CM

## 2019-01-27 MED ORDER — METOPROLOL SUCCINATE ER 100 MG PO TB24: ORAL_TABLET | ORAL | 3 refills | Status: DC

## 2019-01-27 MED ORDER — LISINOPRIL-HYDROCHLOROTHIAZIDE 20-25 MG PO TABS: 1.0000 | ORAL_TABLET | Freq: Every day | ORAL | 3 refills | Status: DC

## 2019-02-08 ENCOUNTER — Encounter: Payer: BLUE CROSS/BLUE SHIELD | Admitting: *Deleted

## 2019-02-09 ENCOUNTER — Telehealth: Payer: Self-pay

## 2019-02-09 NOTE — Telephone Encounter (Signed)
Left message for patient to remind of missed remote transmission.  

## 2019-02-15 ENCOUNTER — Encounter: Payer: Self-pay | Admitting: Cardiology

## 2019-03-03 DIAGNOSIS — I10 Essential (primary) hypertension: Secondary | ICD-10-CM | POA: Diagnosis not present

## 2019-03-03 DIAGNOSIS — H10022 Other mucopurulent conjunctivitis, left eye: Secondary | ICD-10-CM | POA: Diagnosis not present

## 2019-03-15 ENCOUNTER — Telehealth: Payer: Self-pay | Admitting: Internal Medicine

## 2019-03-15 NOTE — Telephone Encounter (Signed)
  Patient has been having some high blood pressure that has been going on for the last couple of weeks. She is interested in setting up an e visit or tele visit with either Dr Ladona Ridgel or Dr Katrinka Blazing as she sees both.

## 2019-03-15 NOTE — Telephone Encounter (Signed)
1)  bp has been elevated, taking both HTN meds as prescribed.  Today 167/94 p 58 Past 2 weeks as high as 178/101 p 67 168/97 p 65, 169/91 p 61 Pt would like to be seen for Htn, pt has not seen Dr Katrinka Blazing since 2016 and has not been addressing BP, I will try to contact Dr Ladona Ridgel Roney Mans RN  to see if he wants to manage her bp or if this needs to be followed by PCP. pt is unable to access mychart/ even though it is active.. She states she will try again and call the help desk.  2)  I can see how staff/ Dr Ladona Ridgel has been trying to contact pt // pt needs pacemaker adjustment, pt states her home monitor has been ringing and not working properly, pt was told that she needs an appointment but needs her remote monitor working and I will forward a MG to the device clinic to call her to help her with the monitor. Pt aware she will be contacted, pt accepting of plan.

## 2019-03-16 ENCOUNTER — Ambulatory Visit (INDEPENDENT_AMBULATORY_CARE_PROVIDER_SITE_OTHER): Payer: BLUE CROSS/BLUE SHIELD | Admitting: *Deleted

## 2019-03-16 ENCOUNTER — Other Ambulatory Visit: Payer: Self-pay

## 2019-03-16 ENCOUNTER — Telehealth: Payer: Self-pay

## 2019-03-16 DIAGNOSIS — I495 Sick sinus syndrome: Secondary | ICD-10-CM | POA: Diagnosis not present

## 2019-03-16 LAB — CUP PACEART REMOTE DEVICE CHECK
Battery Remaining Longevity: 96 mo
Battery Remaining Percentage: 95.5 %
Battery Voltage: 2.99 V
Brady Statistic AP VP Percent: 3.6 %
Brady Statistic AP VS Percent: 1 %
Brady Statistic AS VP Percent: 96 %
Brady Statistic AS VS Percent: 1 %
Brady Statistic RA Percent Paced: 3.4 %
Date Time Interrogation Session: 20200402062936
Implantable Lead Implant Date: 20110113
Implantable Lead Implant Date: 20110113
Implantable Lead Implant Date: 20110113
Implantable Lead Location: 753858
Implantable Lead Location: 753859
Implantable Lead Location: 753860
Implantable Pulse Generator Implant Date: 20180719
Lead Channel Impedance Value: 230 Ohm
Lead Channel Impedance Value: 450 Ohm
Lead Channel Impedance Value: 590 Ohm
Lead Channel Pacing Threshold Amplitude: 0.75 V
Lead Channel Pacing Threshold Amplitude: 0.75 V
Lead Channel Pacing Threshold Amplitude: 1.5 V
Lead Channel Pacing Threshold Pulse Width: 0.4 ms
Lead Channel Pacing Threshold Pulse Width: 0.4 ms
Lead Channel Pacing Threshold Pulse Width: 0.4 ms
Lead Channel Sensing Intrinsic Amplitude: 12 mV
Lead Channel Sensing Intrinsic Amplitude: 2.2 mV
Lead Channel Setting Pacing Amplitude: 1.75 V
Lead Channel Setting Pacing Amplitude: 2 V
Lead Channel Setting Pacing Amplitude: 2.5 V
Lead Channel Setting Pacing Pulse Width: 0.4 ms
Lead Channel Setting Pacing Pulse Width: 0.4 ms
Lead Channel Setting Sensing Sensitivity: 2 mV
Pulse Gen Model: 3222
Pulse Gen Serial Number: 8007164

## 2019-03-16 MED ORDER — CARVEDILOL 25 MG PO TABS: 25.0000 mg | ORAL_TABLET | Freq: Two times a day (BID) | ORAL | 3 refills | Status: DC

## 2019-03-16 NOTE — Telephone Encounter (Signed)
Spoke with patient. Advised that Merlin monitor transmitted overnight, added back to the remote transmission schedule for today, next remote likely on 06/15/19. Pt verbalizes understanding, reports that she unplugged her monitor and plugged it back in. Explained that Dr. Ladona Ridgel did recommend some reprogramming back in January, but that it is not an urgent need right now. Plan was to increase RA sensitivity to 0.43mV (from 0.58mV) due to intermittent low P-wave amplitude. P-waves today are 2.71mV, no undersensing noted on any available EGMs. Explained due to current COVID situation, will plan to make this change at patient's 11/2019 OV with Dr. Ladona Ridgel, though if recurrent issue we may call her to offer sooner appointment. She verbalizes understanding and thanked me for my call.

## 2019-03-16 NOTE — Telephone Encounter (Signed)
Returned call to Pt.  Advised per Dr. Ladona Ridgel to stop Toprol.  Start carvedilol 25 mg BID.  Sent to Pt pharmacy.  Pt will monitor BP's and send me a mychart message in next couple of weeks.

## 2019-03-24 ENCOUNTER — Encounter: Payer: Self-pay | Admitting: Cardiology

## 2019-03-24 NOTE — Progress Notes (Signed)
Remote pacemaker transmission.   

## 2019-06-15 ENCOUNTER — Encounter: Payer: BLUE CROSS/BLUE SHIELD | Admitting: *Deleted

## 2019-06-22 ENCOUNTER — Encounter: Payer: Self-pay | Admitting: Cardiology

## 2019-06-22 ENCOUNTER — Telehealth: Payer: Self-pay | Admitting: Interventional Cardiology

## 2019-06-22 NOTE — Progress Notes (Signed)
Cardiology Office Note:    Date:  06/23/2019   ID:  Alexis Clay, DOB 1966/09/24, MRN [id number]  PCP:  Patient, No Pcp Per  Cardiologist:  Lesleigh Noe, MD  Referring MD: No ref. provider found   Chief Complaint  Patient presents with  . Hypertension    History of Present Illness:    Alexis Clay is a 53 y.o. female with a past medical history significant for chronic systolic heart failure and LBBB, sinus node dysfunction with placement of BiV PPM, SVT, obesity, hypertension, hypertensive heart disease. BiV St Jude PPM placed in 2011 with generator change in 2018. Cardiac cath in 2011 showed widely patent coronary arteries.   EF was down to 35% in the past. Last echo in 11/2015 showed low normal LVEF 50-55%, moderate LVH. In March she was switched from metoprolol to carvedilol for better BP control. Initially the BP was better but it has been up since late June.   Pt has not seen Dr. Katrinka Blazing since 2016. She follows with Dr. Ladona Ridgel for her pacemaker.  She called in yesterday with concerns about elevated BP and fatigue.  161/97 HR 74 167/102 HR 70 167/99 HR 74  Here today her BP is 112/82. She says her BP is higher in the evening. BP has been better on the weekends. 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 She also has not been sleeping well.   She denies chest pain/pressure, shortness of breathing except with running up a flight of stairs, orthpena, PND, edema, lightheadedness, syncope. She had some brief palpitations last week, isolated.   Past Medical History:  Diagnosis Date  . Anxiety disorder   . Biventricular cardiac pacemaker in situ   . Bradycardia   . Cardiomyopathy (HCC)   . CHF (congestive heart failure) (HCC)   . Dilated cardiomyopathy (HCC)   . Headache   . Hypertension, benign   . LBBB (left bundle branch block)   . Panic disorder without agoraphobia   . Presence of permanent cardiac pacemaker   .  Sinoatrial node dysfunction (HCC)   . Sinus node dysfunction (HCC)   . Supraventricular premature beats   . SVT (supraventricular tachycardia) (HCC)   . Systolic heart failure (HCC)    Due to hypertension (LVEF 35-55% after med therapy & resynchronizatoin)    Past Surgical History:  Procedure Laterality Date  . BIV PACEMAKER GENERATOR CHANGEOUT N/A 07/01/2017   Procedure: BiV Pacemaker Generator Changeout;  Surgeon: Marinus Maw, MD;  Location: [facility] INVASIVE CV LAB;  Service: Cardiovascular;  Laterality: N/A;  . CARDIAC CATHETERIZATION  2011   With widely patent coronary arteries   . CESAREAN SECTION  1991  . COLONOSCOPY WITH PROPOFOL N/A 02/04/2016   Procedure: COLONOSCOPY WITH PROPOFOL;  Surgeon: Charna Elizabeth, MD;  Location: [facility] ENDOSCOPY;  Service: Endoscopy;  Laterality: N/A;  . LEEP  10/2010  . PACEMAKER INSERTION  12/26/2009   St. Jude BiV PM.  Model # [id number].  Serial # [id number]    . TUBAL LIGATION      Current Medications: Current Meds  Medication Sig  . aspirin EC 81 MG tablet Take 81 mg by mouth daily.  . carvedilol (COREG) 25 MG tablet Take 1 tablet (25 mg total) by mouth 2 (two) times daily.  Marland Kitchen lisinopril-hydrochlorothiazide (PRINZIDE,ZESTORETIC) 20-25 MG tablet Take 1 tablet by mouth daily.     Allergies:   Patient has no known allergies.   Social History   Socioeconomic  History  . Marital status: Divorced    Spouse name: Not on file  . Number of children: Not on file  . Years of education: Not on file  . Highest education level: Not on file  Occupational History  . Not on file  Social Needs  . Financial resource strain: Not on file  . Food insecurity    Worry: Not on file    Inability: Not on file  . Transportation needs    Medical: Not on file    Non-medical: Not on file  Tobacco Use  . Smoking status: Never Smoker  . Smokeless tobacco: Never Used  Substance and Sexual Activity  . Alcohol use: Yes    Comment: occasionally  . Drug use: No  . Sexual  activity: Not on file  Lifestyle  . Physical activity    Days per week: Not on file    Minutes per session: Not on file  . Stress: Not on file  Relationships  . Social Musicianconnections    Talks on phone: Not on file    Gets together: Not on file    Attends religious service: Not on file    Active member of club or organization: Not on file    Attends meetings of clubs or organizations: Not on file    Relationship status: Not on file  Other Topics Concern  . Not on file  Social History Narrative  . Not on file     Family History: The patient's family history includes CAD in her father; Cancer in her mother; Congestive Heart Failure in her father; Heart disease in her mother; Hypertension in her brother, brother, father, and sister; Renal Disease in her father. ROS:   Please see the history of present illness.     All other systems reviewed and are negative.  EKGs/Labs/Other Studies Reviewed:    The following studies were reviewed today:  Echocardiogram 12/12/2015 Study Conclusions  - Left ventricle: The cavity size was normal. Wall thickness was   increased in a pattern of moderate LVH. Systolic function was   normal. The estimated ejection fraction was in the range of 50%   to 55%. - Left atrium: The atrium was mildly dilated. - Atrial septum: No defect or patent foramen ovale was identified. - Pericardium, extracardiac: A trivial pericardial effusion was   identified posterior to the heart.   EKG:  EKG is not ordered today.   Recent Labs: 06/23/2019: BUN 12; Creatinine, Ser 0.89; Potassium 4.0; Sodium 136   Recent Lipid Panel No results found for: CHOL, TRIG, HDL, CHOLHDL, VLDL, LDLCALC, LDLDIRECT  Physical Exam:    VS:  BP 112/82   Pulse 84   Ht 5\' 6"  (1.676 m)   Wt 227 lb (103 kg)   SpO2 98%   BMI 36.64 kg/m     Wt Readings from Last 3 Encounters:  06/23/19 227 lb (103 kg)  11/09/18 229 lb (103.9 kg)  10/06/17 227 lb (103 kg)     Physical Exam   Constitutional: She is oriented to person, place, and time. She appears well-developed and well-nourished. No distress.  HENT:  Head: Normocephalic and atraumatic.  Neck: Normal range of motion. Neck supple. No JVD present.  Cardiovascular: Normal rate and regular rhythm.  Murmur heard.  Early systolic murmur is present with a grade of 2/6 at the upper right sternal border. Pulmonary/Chest: Effort normal and breath sounds normal. No respiratory distress. She has no wheezes. She has no rales.  Abdominal: Soft. Bowel  sounds are normal.  Musculoskeletal: Normal range of motion.        General: No edema.  Neurological: She is alert and oriented to person, place, and time.  Skin: Skin is warm and dry.  Psychiatric: She has a normal mood and affect. Her behavior is normal. Judgment and thought content normal.  Vitals reviewed.    ASSESSMENT:    1. Essential hypertension   2. Chronic systolic heart failure (HCC)   3. Biventricular cardiac pacemaker in situ   4. Sleep apnea, unspecified type   5. Murmur   6. Obesity (BMI 35.0-39.9 without comorbidity)    PLAN:    In order of problems listed above:  Hypertension Uncontrolled On carvedilol 25 mg and lisinopril-HCTZ 20-25 mg daily. BP is good here, but pt reports has been elevated at home. Will add lisinopril 10 mg in the evening. She will continue to monitor her BP and let us know if still elevated after a few weeks.  Will check BMet for renal function, If renal function reduced, would check renal for RAS.   Chronic systolic heart failure: non-ischemic. Ef had been 35% in the past, recovery of LV function with EF 50-55% in 2016 by echo. No HF s/s. I note a 2/6 early systolic murmur on exam, not previously documented. No recent study. Will recheck echo to evaluate murmur.   BiVentricular pacemaker, St Jude: followed by Dr. Ladona Ridgel. Original in 2011, generator change in 2018.   OSA: Her husband has told her that she snores and sounds like  she is losing her breath. She was ordered a sleep study but was unable to get is done. I will order a split night sleep study. We discussed how untreated sleep apnea can make blood pressure hard to control.   Obesity: Body mass index is 36.64 kg/m.  Advised on improving diet and exercise.     Medication Adjustments/Labs and Tests Ordered: Current medicines are reviewed at length with the patient today.  Concerns regarding medicines are outlined above. Labs and tests ordered and medication changes are outlined in the patient instructions below:  Patient Instructions  Medication Instructions:  Your physician has recommended you make the following change in your medication:   1. START LISINOPRIL 10 MG DAILY   If you need a refill on your cardiac medications before your next appointment, please call your pharmacy.   Lab work: TODAY: BMET If you have labs (blood work) drawn today and your tests are completely normal, you will receive your results only by: Marland Kitchen MyChart Message (if you have MyChart) OR . A paper copy in the mail If you have any lab test that is abnormal or we need to change your treatment, we will call you to review the results.  Testing/Procedures: Your physician has requested that you have an echocardiogram. Echocardiography is a painless test that uses sound waves to create images of your heart. It provides your doctor with information about the size and shape of your heart and how well your heart's chambers and valves are working. This procedure takes approximately one hour. There are no restrictions for this procedure.  Your physician has recommended that you have a sleep study. This test records several body functions during sleep, including: brain activity, eye movement, oxygen and carbon dioxide blood levels, heart rate and rhythm, breathing rate and rhythm, the flow of air through your mouth and nose, snoring, body muscle movements, and chest and belly movement.   Follow-Up: At [facility], you and your health  needs are our priority.  As part of our continuing mission to provide you with exceptional heart care, we have created designated Provider Care Teams.  These Care Teams include your primary Cardiologist (physician) and Advanced Practice Providers (APPs -  Physician Assistants and Nurse Practitioners) who all work together to provide you with the care you need, when you need it. You will need a follow up appointment in 4 months.  Please call our office 2 months in advance to schedule this appointment.  You may see Sinclair Grooms, MD or one of the following Advanced Practice Providers on your designated Care Team:   Truitt Merle, NP Cecilie Kicks, NP . Kathyrn Drown, NP  Any Other Special Instructions Will Be Listed Below (If Applicable).  Lifestyle Modifications to Prevent and Treat Heart Disease -Recommend heart healthy/Mediterranean diet, with whole grains, fruits, vegetable, fish, lean meats, nuts, and olive oil.  -Limit salt. -Recommend moderate walking, 3-5 times/week for 30-50 minutes each session. Aim for at least 150 minutes.week. Goal should be pace of 3 miles/hours, or walking 1.5 miles in 30 minutes -Recommend avoidance of tobacco products. Avoid excess alcohol. -Keep blood pressure well controlled, ideally less than 130/80.   ====================================   DASH Eating Plan DASH stands for "Dietary Approaches to Stop Hypertension." The DASH eating plan is a healthy eating plan that has been shown to reduce high blood pressure (hypertension). It may also reduce your risk for type 2 diabetes, heart disease, and stroke. The DASH eating plan may also help with weight loss. What are tips for following this plan?  General guidelines  Avoid eating more than 2,300 mg (milligrams) of salt (sodium) a day. If you have hypertension, you may need to reduce your sodium intake to 1,500 mg a day.  Limit alcohol intake to no more than  1 drink a day for nonpregnant women and 2 drinks a day for men. One drink equals 12 oz of beer, 5 oz of wine, or 1 oz of hard liquor.  Work with your health care provider to maintain a healthy body weight or to lose weight. Ask what an ideal weight is for you.  Get at least 30 minutes of exercise that causes your heart to beat faster (aerobic exercise) most days of the week. Activities may include walking, swimming, or biking.  Work with your health care provider or diet and nutrition specialist (dietitian) to adjust your eating plan to your individual calorie needs. Reading food labels   Check food labels for the amount of sodium per serving. Choose foods with less than 5 percent of the Daily Value of sodium. Generally, foods with less than 300 mg of sodium per serving fit into this eating plan.  To find whole grains, look for the word "whole" as the first word in the ingredient list. Shopping  Buy products labeled as "low-sodium" or "no salt added."  Buy fresh foods. Avoid canned foods and premade or frozen meals. Cooking  Avoid adding salt when cooking. Use salt-free seasonings or herbs instead of table salt or sea salt. Check with your health care provider or pharmacist before using salt substitutes.  Do not fry foods. Cook foods using healthy methods such as baking, boiling, grilling, and broiling instead.  Cook with heart-healthy oils, such as olive, canola, soybean, or sunflower oil. Meal planning  Eat a balanced diet that includes: ? 5 or more servings of fruits and vegetables each day. At each meal, try to fill half of your plate with fruits  and vegetables. ? Up to 6-8 servings of whole grains each day. ? Less than 6 oz of lean meat, poultry, or fish each day. A 3-oz serving of meat is about the same size as a deck of cards. One egg equals 1 oz. ? 2 servings of low-fat dairy each day. ? A serving of nuts, seeds, or beans 5 times each week. ? Heart-healthy fats. Healthy fats  called Omega-3 fatty acids are found in foods such as flaxseeds and coldwater fish, like sardines, salmon, and mackerel.  Limit how much you eat of the following: ? Canned or prepackaged foods. ? Food that is high in trans fat, such as fried foods. ? Food that is high in saturated fat, such as fatty meat. ? Sweets, desserts, sugary drinks, and other foods with added sugar. ? Full-fat dairy products.  Do not salt foods before eating.  Try to eat at least 2 vegetarian meals each week.  Eat more home-cooked food and less restaurant, buffet, and fast food.  When eating at a restaurant, ask that your food be prepared with less salt or no salt, if possible. What foods are recommended? The items listed may not be a complete list. Talk with your dietitian about what dietary choices are best for you. Grains Whole-grain or whole-wheat bread. Whole-grain or whole-wheat pasta. Brown rice. Orpah Cobbatmeal. Quinoa. Bulgur. Whole-grain and low-sodium cereals. Pita bread. Low-fat, low-sodium crackers. Whole-wheat flour tortillas. Vegetables Fresh or frozen vegetables (raw, steamed, roasted, or grilled). Low-sodium or reduced-sodium tomato and vegetable juice. Low-sodium or reduced-sodium tomato sauce and tomato paste. Low-sodium or reduced-sodium canned vegetables. Fruits All fresh, dried, or frozen fruit. Canned fruit in natural juice (without added sugar). Meat and other protein foods Skinless chicken or Malawiturkey. Ground chicken or Mal[place]iturkey. Pork with fat trimmed off. Fish and seafood. Egg whites. Dried beans, peas, or lentils. Unsalted nuts, nut butters, and seeds. Unsalted canned beans. Lean cuts of beef with fat trimmed off. Low-sodium, lean deli meat. Dairy Low-fat (1%) or fat-free (skim) milk. Fat-free, low-fat, or reduced-fat cheeses. Nonfat, low-sodium ricotta or cottage cheese. Low-fat or nonfat yogurt. Low-fat, low-sodium cheese. Fats and oils Soft margarine without trans fats. Vegetable oil. Low-fat,  reduced-fat, or light mayonnaise and salad dressings (reduced-sodium). Canola, safflower, olive, soybean, and sunflower oils. Avocado. Seasoning and other foods Herbs. Spices. Seasoning mixes without salt. Unsalted popcorn and pretzels. Fat-free sweets. What foods are not recommended? The items listed may not be a complete list. Talk with your dietitian about what dietary choices are best for you. Grains Baked goods made with fat, such as croissants, muffins, or some breads. Dry pasta or rice meal packs. Vegetables Creamed or fried vegetables. Vegetables in a cheese sauce. Regular canned vegetables (not low-sodium or reduced-sodium). Regular canned tomato sauce and paste (not low-sodium or reduced-sodium). Regular tomato and vegetable juice (not low-sodium or reduced-sodium). Rosita FirePickles. Olives. Fruits Canned fruit in a light or heavy syrup. Fried fruit. Fruit in cream or butter sauce. Meat and other protein foods Fatty cuts of meat. Ribs. Fried meat. Tomasa BlaseBacon. Sausage. Bologna and other processed lunch meats. Salami. Fatback. Hotdogs. Bratwurst. Salted nuts and seeds. Canned beans with added salt. Canned or smoked fish. Whole eggs or egg yolks. Chicken or Malawiturkey with skin. Dairy Whole or 2% milk, cream, and half-and-half. Whole or full-fat cream cheese. Whole-fat or sweetened yogurt. Full-fat cheese. Nondairy creamers. Whipped toppings. Processed cheese and cheese spreads. Fats and oils Butter. Stick margarine. Lard. Shortening. Ghee. Bacon fat. Tropical oils, such as coconut, palm kernel,  or palm oil. Seasoning and other foods Salted popcorn and pretzels. Onion salt, garlic salt, seasoned salt, table salt, and sea salt. Worcestershire sauce. Tartar sauce. Barbecue sauce. Teriyaki sauce. Soy sauce, including reduced-sodium. Steak sauce. Canned and packaged gravies. Fish sauce. Oyster sauce. Cocktail sauce. Horseradish that you find on the shelf. Ketchup. Mustard. Meat flavorings and tenderizers.  Bouillon cubes. Hot sauce and Tabasco sauce. Premade or packaged marinades. Premade or packaged taco seasonings. Relishes. Regular salad dressings. Where to find more information:  National Heart, Lung, and Blood Institute: PopSteam.iswww.nhlbi.nih.gov  American Heart Association: www.heart.org Summary  The DASH eating plan is a healthy eating plan that has been shown to reduce high blood pressure (hypertension). It may also reduce your risk for type 2 diabetes, heart disease, and stroke.  With the DASH eating plan, you should limit salt (sodium) intake to 2,300 mg a day. If you have hypertension, you may need to reduce your sodium intake to 1,500 mg a day.  When on the DASH eating plan, aim to eat more fresh fruits and vegetables, whole grains, lean proteins, low-fat dairy, and heart-healthy fats.  Work with your health care provider or diet and nutrition specialist (dietitian) to adjust your eating plan to your individual calorie needs. This information is not intended to replace advice given to you by your health care provider. Make sure you discuss any questions you have with your health care provider. Document Released: 11/19/2011 Document Revised: 11/12/2017 Document Reviewed: 11/23/2016 Elsevier Patient Education  2020 [employer]   Echocardiogram An echocardiogram is a procedure that uses painless sound waves (ultrasound) to produce an image of the heart. Images from an echocardiogram can provide important information about:  Signs of coronary artery disease (CAD).  Aneurysm detection. An aneurysm is a weak or damaged part of an artery wall that bulges out from the normal force of blood pumping through the body.  Heart size and shape. Changes in the size or shape of the heart can be associated with certain conditions, including heart failure, aneurysm, and CAD.  Heart muscle function.  Heart valve function.  Signs of a past heart attack.  Fluid buildup around the heart.   Thickening of the heart muscle.  A tumor or infectious growth around the heart valves. Tell a health care provider about:  Any allergies you have.  All medicines you are taking, including vitamins, herbs, eye drops, creams, and over-the-counter medicines.  Any blood disorders you have.  Any surgeries you have had.  Any medical conditions you have.  Whether you are pregnant or may be pregnant. What are the risks? Generally, this is a safe procedure. However, problems may occur, including:  Allergic reaction to dye (contrast) that may be used during the procedure. What happens before the procedure? No specific preparation is needed. You may eat and drink normally. What happens during the procedure?   An IV tube may be inserted into one of your veins.  You may receive contrast through this tube. A contrast is an injection that improves the quality of the pictures from your heart.  A gel will be applied to your chest.  A wand-like tool (transducer) will be moved over your chest. The gel will help to transmit the sound waves from the transducer.  The sound waves will harmlessly bounce off of your heart to allow the heart images to be captured in real-time motion. The images will be recorded on a computer. The procedure may vary among health care providers and hospitals. What happens  after the procedure?  You may return to your normal, everyday life, including diet, activities, and medicines, unless your health care provider tells you not to do that. Summary  An echocardiogram is a procedure that uses painless sound waves (ultrasound) to produce an image of the heart.  Images from an echocardiogram can provide important information about the size and shape of your heart, heart muscle function, heart valve function, and fluid buildup around your heart.  You do not need to do anything to prepare before this procedure. You may eat and drink normally.  After the echocardiogram is  completed, you may return to your normal, everyday life, unless your health care provider tells you not to do that. This information is not intended to replace advice given to you by your health care provider. Make sure you discuss any questions you have with your health care provider. Document Released: 11/27/2000 Document Revised: 03/23/2019 Document Reviewed: 01/02/2017 Elsevier Patient Education  2020 [employer].     Signed, Berton Bon, NP  06/23/2019 7:24 PM    [facility]

## 2019-06-22 NOTE — Telephone Encounter (Signed)
°  Pt c/o BP issue: STAT if pt c/o blurred vision, one-sided weakness or slurred speech  1. What are your last 5 BP readings?   161/97 HR 74 167/102 HR 70 167/99 HR 74  2. Are you having any other symptoms (ex. Dizziness, headache, blurred vision, passed out)?  Just feels abnormal.    3. What is your BP issue? Higher than normal dystolic numbers  Patient called with a similar issue back in March, and Dr. Tamala Julian adjusted her medication. She is wondering if her medication may need to be adjusted again. She is worried that her bottom numbers are going high, and that they should be lower to keep her pacemaker working.   She can come in for an appointment if necessary.

## 2019-06-22 NOTE — Telephone Encounter (Signed)
Spoke with pt and she states her BP has been elevated for about 3 weeks now.  Readings listed below are most recent.  She feels very fatigued and is concerned.  Pt hasn't seen Dr. Tamala Julian since 2016 but has been seeing Dr. Lovena Le.  Denies CP, SOB, HA, or blurred vision.  Pt had similar episode back at the end or March/beginning of April and called.  Dr. Lovena Le changed Coreg dose at that time and pt had been fine until now.  Scheduled pt to come in tomorrow to see Daune Perch, NP.  Screened for COVID.       COVID-19 Pre-Screening Questions:  . In the past 7 to 10 days have you had a cough,  shortness of breath, headache, congestion, fever (100 or greater) body aches, chills, sore throat, or sudden loss of taste or sense of smell? No . Have you been around anyone with known Covid 19? No . Have you been around anyone who is awaiting Covid 19 test results in the past 7 to 10 days? No . Have you been around anyone who has been exposed to Covid 19, or has mentioned symptoms of Covid 19 within the past 7 to 10 days? No  If you have any concerns/questions about symptoms patients report during screening (either on the phone or at threshold). Contact the provider seeing the patient or DOD for further guidance.  If neither are available contact a member of the leadership team.

## 2019-06-23 ENCOUNTER — Encounter: Payer: Self-pay | Admitting: Cardiology

## 2019-06-23 ENCOUNTER — Ambulatory Visit: Payer: BC Managed Care – PPO | Admitting: Cardiology

## 2019-06-23 ENCOUNTER — Other Ambulatory Visit: Payer: Self-pay

## 2019-06-23 ENCOUNTER — Telehealth: Payer: Self-pay | Admitting: *Deleted

## 2019-06-23 VITALS — BP 112/82 | HR 84 | Ht 66.0 in | Wt 227.0 lb

## 2019-06-23 DIAGNOSIS — I5022 Chronic systolic (congestive) heart failure: Secondary | ICD-10-CM | POA: Diagnosis not present

## 2019-06-23 DIAGNOSIS — I10 Essential (primary) hypertension: Secondary | ICD-10-CM | POA: Diagnosis not present

## 2019-06-23 DIAGNOSIS — Z950 Presence of cardiac pacemaker: Secondary | ICD-10-CM

## 2019-06-23 DIAGNOSIS — G4730 Sleep apnea, unspecified: Secondary | ICD-10-CM | POA: Diagnosis not present

## 2019-06-23 DIAGNOSIS — R011 Cardiac murmur, unspecified: Secondary | ICD-10-CM

## 2019-06-23 DIAGNOSIS — E669 Obesity, unspecified: Secondary | ICD-10-CM

## 2019-06-23 LAB — BASIC METABOLIC PANEL
BUN/Creatinine Ratio: 13 (ref 9–23)
BUN: 12 mg/dL (ref 6–24)
CO2: 25 mmol/L (ref 20–29)
Calcium: 9 mg/dL (ref 8.7–10.2)
Chloride: 96 mmol/L (ref 96–106)
Creatinine, Ser: 0.89 mg/dL (ref 0.57–1.00)
GFR calc Af Amer: 86 mL/min/{1.73_m2} (ref >59)
GFR calc non Af Amer: 74 mL/min/{1.73_m2} (ref >59)
Glucose: 90 mg/dL (ref 65–99)
Potassium: 4 mmol/L (ref 3.5–5.2)
Sodium: 136 mmol/L (ref 134–144)

## 2019-06-23 MED ORDER — LISINOPRIL 10 MG PO TABS: 10.0000 mg | ORAL_TABLET | Freq: Every day | ORAL | 3 refills | Status: DC

## 2019-06-23 NOTE — Telephone Encounter (Signed)
-----   Message from Jacinta Shoe, [place] sent at 06/23/2019 10:25 AM EDT ----- Regarding: Sleep Study Hello,  Patient seen Pecolia Ades the the office today and will need a sleep study. Dx: Sleep apnea  Thank you

## 2019-06-23 NOTE — Patient Instructions (Addendum)
Medication Instructions:  Your physician has recommended you make the following change in your medication:   1. START LISINOPRIL 10 MG DAILY   If you need a refill on your cardiac medications before your next appointment, please call your pharmacy.   Lab work: TODAY: BMET If you have labs (blood work) drawn today and your tests are completely normal, you will receive your results only by:  [facility] (if you have MyChart) OR  A paper copy in the mail If you have any lab test that is abnormal or we need to change your treatment, we will call you to review the results.  Testing/Procedures: Your physician has requested that you have an echocardiogram. Echocardiography is a painless test that uses sound waves to create images of your heart. It provides your doctor with information about the size and shape of your heart and how well your hearts chambers and valves are working. This procedure takes approximately one hour. There are no restrictions for this procedure.  Your physician has recommended that you have a sleep study. This test records several body functions during sleep, including: brain activity, eye movement, oxygen and carbon dioxide blood levels, heart rate and rhythm, breathing rate and rhythm, the flow of air through your mouth and nose, snoring, body muscle movements, and chest and belly movement.  Follow-Up: At [facility], you and your health needs are our priority.  As part of our continuing mission to provide you with exceptional heart care, we have created designated Provider Care Teams.  These Care Teams include your primary Cardiologist (physician) and Advanced Practice Providers (APPs -  Physician Assistants and Nurse Practitioners) who all work together to provide you with the care you need, when you need it. You will need a follow up appointment in 4 months.  Please call our office 2 months in advance to schedule this appointment.  You may see Sinclair Grooms, MD  or one of the following Advanced Practice Providers on your designated Care Team:   Truitt Merle, NP Cecilie Kicks, NP  Kathyrn Drown, NP  Any Other Special Instructions Will Be Listed Below (If Applicable).  Lifestyle Modifications to Prevent and Treat Heart Disease -Recommend heart healthy/Mediterranean diet, with whole grains, fruits, vegetable, fish, lean meats, nuts, and olive oil.  -Limit salt. -Recommend moderate walking, 3-5 times/week for 30-50 minutes each session. Aim for at least 150 minutes.week. Goal should be pace of 3 miles/hours, or walking 1.5 miles in 30 minutes -Recommend avoidance of tobacco products. Avoid excess alcohol. -Keep blood pressure well controlled, ideally less than 130/80.   ====================================   DASH Eating Plan DASH stands for "Dietary Approaches to Stop Hypertension." The DASH eating plan is a healthy eating plan that has been shown to reduce high blood pressure (hypertension). It may also reduce your risk for type 2 diabetes, heart disease, and stroke. The DASH eating plan may also help with weight loss. What are tips for following this plan?  General guidelines  Avoid eating more than 2,300 mg (milligrams) of salt (sodium) a day. If you have hypertension, you may need to reduce your sodium intake to 1,500 mg a day.  Limit alcohol intake to no more than 1 drink a day for nonpregnant women and 2 drinks a day for men. One drink equals 12 oz of beer, 5 oz of wine, or 1 oz of hard liquor.  Work with your health care provider to maintain a healthy body weight or to lose weight. Ask what an  ideal weight is for you.  Get at least 30 minutes of exercise that causes your heart to beat faster (aerobic exercise) most days of the week. Activities may include walking, swimming, or biking.  Work with your health care provider or diet and nutrition specialist (dietitian) to adjust your eating plan to your individual calorie needs. Reading food  labels   Check food labels for the amount of sodium per serving. Choose foods with less than 5 percent of the Daily Value of sodium. Generally, foods with less than 300 mg of sodium per serving fit into this eating plan.  To find whole grains, look for the word "whole" as the first word in the ingredient list. Shopping  Buy products labeled as "low-sodium" or "no salt added."  Buy fresh foods. Avoid canned foods and premade or frozen meals. Cooking  Avoid adding salt when cooking. Use salt-free seasonings or herbs instead of table salt or sea salt. Check with your health care provider or pharmacist before using salt substitutes.  Do not fry foods. Cook foods using healthy methods such as baking, boiling, grilling, and broiling instead.  Cook with heart-healthy oils, such as olive, canola, soybean, or sunflower oil. Meal planning  Eat a balanced diet that includes: ? 5 or more servings of fruits and vegetables each day. At each meal, try to fill half of your plate with fruits and vegetables. ? Up to 6-8 servings of whole grains each day. ? Less than 6 oz of lean meat, poultry, or fish each day. A 3-oz serving of meat is about the same size as a deck of cards. One egg equals 1 oz. ? 2 servings of low-fat dairy each day. ? A serving of nuts, seeds, or beans 5 times each week. ? Heart-healthy fats. Healthy fats called Omega-3 fatty acids are found in foods such as flaxseeds and coldwater fish, like sardines, salmon, and mackerel.  Limit how much you eat of the following: ? Canned or prepackaged foods. ? Food that is high in trans fat, such as fried foods. ? Food that is high in saturated fat, such as fatty meat. ? Sweets, desserts, sugary drinks, and other foods with added sugar. ? Full-fat dairy products.  Do not salt foods before eating.  Try to eat at least 2 vegetarian meals each week.  Eat more home-cooked food and less restaurant, buffet, and fast food.  When eating at a  restaurant, ask that your food be prepared with less salt or no salt, if possible. What foods are recommended? The items listed may not be a complete list. Talk with your dietitian about what dietary choices are best for you. Grains Whole-grain or whole-wheat bread. Whole-grain or whole-wheat pasta. Brown rice. Orpah Cobbatmeal. Quinoa. Bulgur. Whole-grain and low-sodium cereals. Pita bread. Low-fat, low-sodium crackers. Whole-wheat flour tortillas. Vegetables Fresh or frozen vegetables (raw, steamed, roasted, or grilled). Low-sodium or reduced-sodium tomato and vegetable juice. Low-sodium or reduced-sodium tomato sauce and tomato paste. Low-sodium or reduced-sodium canned vegetables. Fruits All fresh, dried, or frozen fruit. Canned fruit in natural juice (without added sugar). Meat and other protein foods Skinless chicken or Malawiturkey. Ground chicken or Mal[place]urkey. Pork with fat trimmed off. Fish and seafood. Egg whites. Dried beans, peas, or lentils. Unsalted nuts, nut butters, and seeds. Unsalted canned beans. Lean cuts of beef with fat trimmed off. Low-sodium, lean deli meat. Dairy Low-fat (1%) or fat-free (skim) milk. Fat-free, low-fat, or reduced-fat cheeses. Nonfat, low-sodium ricotta or cottage cheese. Low-fat or nonfat yogurt. Low-fat, low-sodium cheese.  Fats and oils Soft margarine without trans fats. Vegetable oil. Low-fat, reduced-fat, or light mayonnaise and salad dressings (reduced-sodium). Canola, safflower, olive, soybean, and sunflower oils. Avocado. Seasoning and other foods Herbs. Spices. Seasoning mixes without salt. Unsalted popcorn and pretzels. Fat-free sweets. What foods are not recommended? The items listed may not be a complete list. Talk with your dietitian about what dietary choices are best for you. Grains Baked goods made with fat, such as croissants, muffins, or some breads. Dry pasta or rice meal packs. Vegetables Creamed or fried vegetables. Vegetables in a cheese sauce.  Regular canned vegetables (not low-sodium or reduced-sodium). Regular canned tomato sauce and paste (not low-sodium or reduced-sodium). Regular tomato and vegetable juice (not low-sodium or reduced-sodium). Rosita FirePickles. Olives. Fruits Canned fruit in a light or heavy syrup. Fried fruit. Fruit in cream or butter sauce. Meat and other protein foods Fatty cuts of meat. Ribs. Fried meat. Tomasa BlaseBacon. Sausage. Bologna and other processed lunch meats. Salami. Fatback. Hotdogs. Bratwurst. Salted nuts and seeds. Canned beans with added salt. Canned or smoked fish. Whole eggs or egg yolks. Chicken or Malaw[place]urkey with skin. Dairy Whole or 2% milk, cream, and half-and-half. Whole or full-fat cream cheese. Whole-fat or sweetened yogurt. Full-fat cheese. Nondairy creamers. Whipped toppings. Processed cheese and cheese spreads. Fats and oils Butter. Stick margarine. Lard. Shortening. Ghee. Bacon fat. Tropical oils, such as coconut, palm kernel, or palm oil. Seasoning and other foods Salted popcorn and pretzels. Onion salt, garlic salt, seasoned salt, table salt, and sea salt. Worcestershire sauce. Tartar sauce. Barbecue sauce. Teriyaki sauce. Soy sauce, including reduced-sodium. Steak sauce. Canned and packaged gravies. Fish sauce. Oyster sauce. Cocktail sauce. Horseradish that you find on the shelf. Ketchup. Mustard. Meat flavorings and tenderizers. Bouillon cubes. Hot sauce and Tabasco sauce. Premade or packaged marinades. Premade or packaged taco seasonings. Relishes. Regular salad dressings. Where to find more information:  National Heart, Lung, and Blood Institute: PopSteam.iswww.nhlbi.nih.gov  American Heart Association: www.heart.org Summary  The DASH eating plan is a healthy eating plan that has been shown to reduce high blood pressure (hypertension). It may also reduce your risk for type 2 diabetes, heart disease, and stroke.  With the DASH eating plan, you should limit salt (sodium) intake to 2,300 mg a day. If you have  hypertension, you may need to reduce your sodium intake to 1,500 mg a day.  When on the DASH eating plan, aim to eat more fresh fruits and vegetables, whole grains, lean proteins, low-fat dairy, and heart-healthy fats.  Work with your health care provider or diet and nutrition specialist (dietitian) to adjust your eating plan to your individual calorie needs. This information is not intended to replace advice given to you by your health care provider. Make sure you discuss any questions you have with your health care provider. Document Released: 11/19/2011 Document Revised: 11/12/2017 Document Reviewed: 11/23/2016 Elsevier Patient Education  2020 [employer]   Echocardiogram An echocardiogram is a procedure that uses painless sound waves (ultrasound) to produce an image of the heart. Images from an echocardiogram can provide important information about:  Signs of coronary artery disease (CAD).  Aneurysm detection. An aneurysm is a weak or damaged part of an artery wall that bulges out from the normal force of blood pumping through the body.  Heart size and shape. Changes in the size or shape of the heart can be associated with certain conditions, including heart failure, aneurysm, and CAD.  Heart muscle function.  Heart valve function.  Signs of a past  heart attack.  Fluid buildup around the heart.  Thickening of the heart muscle.  A tumor or infectious growth around the heart valves. Tell a health care provider about:  Any allergies you have.  All medicines you are taking, including vitamins, herbs, eye drops, creams, and over-the-counter medicines.  Any blood disorders you have.  Any surgeries you have had.  Any medical conditions you have.  Whether you are pregnant or may be pregnant. What are the risks? Generally, this is a safe procedure. However, problems may occur, including:  Allergic reaction to dye (contrast) that may be used during the procedure. What  happens before the procedure? No specific preparation is needed. You may eat and drink normally. What happens during the procedure?   An IV tube may be inserted into one of your veins.  You may receive contrast through this tube. A contrast is an injection that improves the quality of the pictures from your heart.  A gel will be applied to your chest.  A wand-like tool (transducer) will be moved over your chest. The gel will help to transmit the sound waves from the transducer.  The sound waves will harmlessly bounce off of your heart to allow the heart images to be captured in real-time motion. The images will be recorded on a computer. The procedure may vary among health care providers and hospitals. What happens after the procedure?  You may return to your normal, everyday life, including diet, activities, and medicines, unless your health care provider tells you not to do that. Summary  An echocardiogram is a procedure that uses painless sound waves (ultrasound) to produce an image of the heart.  Images from an echocardiogram can provide important information about the size and shape of your heart, heart muscle function, heart valve function, and fluid buildup around your heart.  You do not need to do anything to prepare before this procedure. You may eat and drink normally.  After the echocardiogram is completed, you may return to your normal, everyday life, unless your health care provider tells you not to do that. This information is not intended to replace advice given to you by your health care provider. Make sure you discuss any questions you have with your health care provider. Document Released: 11/27/2000 Document Revised: 03/23/2019 Document Reviewed: 01/02/2017 Elsevier Patient Education  2020 [employer]

## 2019-06-27 ENCOUNTER — Telehealth: Payer: Self-pay | Admitting: *Deleted

## 2019-06-27 NOTE — Telephone Encounter (Signed)
Staff message sent to [facility] aut received. Ok to schedule sleep study. Order # [id number] Valid dates 06/27/19 to 12/24/19.

## 2019-06-27 NOTE — Telephone Encounter (Signed)
-----   Message from Natasha Baker, CMA sent at 06/23/2019 10:25 AM EDT ----- Regarding: Sleep Study Hello,  Patient seen Alexis Clay the the office today and will need a sleep study. Dx: Sleep apnea  Thank you  

## 2019-06-29 ENCOUNTER — Ambulatory Visit (HOSPITAL_COMMUNITY): Payer: BC Managed Care – PPO | Attending: Internal Medicine

## 2019-06-29 ENCOUNTER — Other Ambulatory Visit: Payer: Self-pay

## 2019-06-29 DIAGNOSIS — R011 Cardiac murmur, unspecified: Secondary | ICD-10-CM | POA: Insufficient documentation

## 2019-06-30 ENCOUNTER — Telehealth: Payer: Self-pay | Admitting: *Deleted

## 2019-06-30 NOTE — Telephone Encounter (Signed)
-----   Message from Lauralee Evener, [place] sent at 06/27/2019  2:17 PM EDT ----- Regarding: RE: Sleep Study BC[employer] approved sleep study. Ok to schedule. Order # [id number] Valid 06/27/19 to 12/24/2019 ----- Message ----- From: Jacinta Shoe, CMA Sent: 06/23/2019  10:25 AM EDT To: Freada Bergeron, CMA, [facility] Sleep Studies Subject: Sleep Study                                    Hello,  Patient seen Pecolia Ades the the office today and will need a sleep study. Dx: Sleep apnea  Thank you

## 2019-06-30 NOTE — Telephone Encounter (Signed)
Patient is scheduled for lab study on 7/29. Patient understands her sleep study will be done at [facility] sleep lab. Patient understands she will receive a sleep packet in a week or so. Patient understands to call if she does not receive the sleep packet in a timely manner. She is scheduled for COVID screening on 7/27 12:15 prior to SS. Patient agrees with treatment and thanked me for call

## 2019-07-10 ENCOUNTER — Other Ambulatory Visit (HOSPITAL_COMMUNITY)
Admission: RE | Admit: 2019-07-10 | Discharge: 2019-07-10 | Disposition: A | Discharge status: Home / Self Care | Payer: BC Managed Care – PPO | Source: Ambulatory Visit | Attending: Cardiology | Admitting: Cardiology

## 2019-07-10 ENCOUNTER — Other Ambulatory Visit: Payer: Self-pay

## 2019-07-10 DIAGNOSIS — Z20828 Contact with and (suspected) exposure to other viral communicable diseases: Secondary | ICD-10-CM | POA: Insufficient documentation

## 2019-07-10 LAB — SARS CORONAVIRUS 2 (TAT 6-24 HRS): SARS Coronavirus 2: NEGATIVE

## 2019-07-12 ENCOUNTER — Ambulatory Visit (HOSPITAL_BASED_OUTPATIENT_CLINIC_OR_DEPARTMENT_OTHER): Payer: BC Managed Care – PPO | Attending: Cardiology | Admitting: Cardiology

## 2019-07-12 ENCOUNTER — Other Ambulatory Visit: Payer: Self-pay

## 2019-07-12 DIAGNOSIS — G4730 Sleep apnea, unspecified: Secondary | ICD-10-CM | POA: Diagnosis not present

## 2019-07-12 DIAGNOSIS — G4733 Obstructive sleep apnea (adult) (pediatric): Secondary | ICD-10-CM | POA: Diagnosis not present

## 2019-07-17 NOTE — Procedures (Signed)
Patient Name: Alexis Clay, Alexis Clay Date: 07/12/2019 Gender: Female D.O.B: 01/18/1966 Age (years): 60 Referring Provider: Daune Perch NP Height (inches): 43 Interpreting Physician: Fransico Him MD, ABSM Weight (lbs): 220 RPSGT: Zadie Rhine BMI: 36 MRN: [id number] Neck Size: 15.00   CLINICAL INFORMATION Sleep Study Type: Split Night CPAP  Indication for sleep study: Snoring  Epworth Sleepiness Score: 12  SLEEP STUDY TECHNIQUE As per the AASM Manual for the Scoring of Sleep and Associated Events v2.3 (April 2016) with a hypopnea requiring 4% desaturations.  The channels recorded and monitored were frontal, central and occipital EEG, electrooculogram (EOG), submentalis EMG (chin), nasal and oral airflow, thoracic and abdominal wall motion, anterior tibialis EMG, snore microphone, electrocardiogram, and pulse oximetry. Continuous positive airway pressure (CPAP) was initiated when the patient met split night criteria and was titrated according to treat sleep-disordered breathing.  MEDICATIONS Medications self-administered by patient taken the night of the study : N/A  RESPIRATORY PARAMETERS Diagnostic Total AHI (/hr): 17.4  RDI (/hr):31.0  OA Index (/hr): 0.5  CA Index (/hr): 0.0 REM AHI (/hr): N/A  NREM AHI (/hr):17.4  Supine AHI (/hr):N/A  Non-supine AHI (/hr):17.4 Min O2 Sat (%):88.0  Mean O2 (%): 93.9  Time below 88% (min):0.1   Titration Optimal Pressure (cm):12  AHI at Optimal Pressure (/hr):0.0  Min O2 at Optimal Pressure (%):95.0 Supine % at Optimal (%):0  Sleep % at Optimal (%):100   SLEEP ARCHITECTURE The recording time for the entire night was 384.1 minutes.  During a baseline period of 146.5 minutes, the patient slept for 124.0 minutes in REM and nonREM, yielding a sleep efficiency of 84.6%. Sleep onset after lights out was 4.5 minutes with a REM latency of N/A minutes. The patient spent 6.5% of the night in stage N1 sleep, 92.7% in stage N2  sleep, 0.8% in stage N3 and 0% in REM.  During the titration period of 231.0 minutes, the patient slept for 215.6 minutes in REM and nonREM, yielding a sleep efficiency of 93.3%. Sleep onset after CPAP initiation was 6.9 minutes with a REM latency of 12.0 minutes. The patient spent 2.8%%of the night in stage N1 sleep, 77.3% in stage N2 sleep, 1.4% in stage N3 and 18.6% in REM.  CARDIAC DATA The 2 lead EKG demonstrated sinus rhythm. The mean heart rate was 100.0 beats per minute. Other EKG findings include: None.  LEG MOVEMENT DATA The total Periodic Limb Movements of Sleep (PLMS) were 0. The PLMS index was 0.0 .  IMPRESSIONS - Moderate obstructive sleep apnea occurred during the diagnostic portion of the study(AHI = 17.4/hour). An optimal PAP pressure was selected for this patient ( 12 cm of water) - No significant central sleep apnea occurred during the diagnostic portion of the study (CAI = 0.0/hour). - The patient had oxygen desaturation during the diagnostic portion of the study (Min O2 = 88.0%) - No snoring was audible during the diagnostic portion of the study. - No cardiac abnormalities were noted during this study. - Clinically significant periodic limb movements did not occur during sleep.  DIAGNOSIS - Obstructive Sleep Apnea (327.23 [G47.33 ICD-10])  RECOMMENDATIONS - Trial of CPAP therapy on 12 cm H2O with a Standard size Resmed Nasal Mask Mirage FX for Her mask and heated humidification. - Avoid alcohol, sedatives and other CNS depressants that may worsen sleep apnea and disrupt normal sleep architecture. - Sleep hygiene should be reviewed to assess factors that may improve sleep quality. - Weight management and regular exercise should be initiated or  continued. - Return to Sleep Center for re-evaluation after 10 weeks of therapy  [Electronically signed] 07/17/2019 10:11 PM  Fransico Him MD, ABSM Diplomate, American Board of Sleep Medicine

## 2019-07-21 ENCOUNTER — Telehealth: Payer: Self-pay | Admitting: *Deleted

## 2019-07-21 NOTE — Telephone Encounter (Signed)
Informed patient of sleep study results and patient understanding was verbalized. Patient understands her sleep study showed they have significant sleep apnea and had successful PAP titration and will be set up with PAP unit. Order is in [place]. Please set patient up for OV in 10 weeks. Upon patient request DME selection is [facility]. Patient understands SHE will be contacted by [facility] to set up HER cpap. Patient understands to call if [facility] does not contact HER with new setup in a timely manner. Patient understands they will be called once confirmation has been received from [facility] that they have received their new machine to schedule 10 week follow up appointment.  [facility] notified of new cpap order  Please add to airview Patient was grateful for the call and thanked me.

## 2019-07-21 NOTE — Telephone Encounter (Signed)
-----   Message from Sueanne Margarita, MD sent at 07/17/2019 10:14 PM EDT ----- Please let patient know that they have significant sleep apnea and had successful PAP titration and will be set up with PAP unit.  Please let DME know that order is in [facility].  Please set patient up for OV in 10 weeks

## 2019-08-09 DIAGNOSIS — Z01419 Encounter for gynecological examination (general) (routine) without abnormal findings: Secondary | ICD-10-CM | POA: Diagnosis not present

## 2019-08-09 DIAGNOSIS — Z6836 Body mass index (BMI) 36.0-36.9, adult: Secondary | ICD-10-CM | POA: Diagnosis not present

## 2019-08-09 DIAGNOSIS — I519 Heart disease, unspecified: Secondary | ICD-10-CM | POA: Insufficient documentation

## 2019-08-09 DIAGNOSIS — Z1231 Encounter for screening mammogram for malignant neoplasm of breast: Secondary | ICD-10-CM | POA: Diagnosis not present

## 2019-08-14 DIAGNOSIS — G4733 Obstructive sleep apnea (adult) (pediatric): Secondary | ICD-10-CM | POA: Diagnosis not present

## 2019-08-22 ENCOUNTER — Other Ambulatory Visit: Payer: Self-pay

## 2019-08-22 ENCOUNTER — Emergency Department (HOSPITAL_COMMUNITY): Payer: BC Managed Care – PPO

## 2019-08-22 ENCOUNTER — Emergency Department (HOSPITAL_COMMUNITY)
Admission: EM | Admit: 2019-08-22 | Discharge: 2019-08-22 | Disposition: A | Discharge status: Home / Self Care | Payer: BC Managed Care – PPO | Attending: Emergency Medicine | Admitting: Emergency Medicine

## 2019-08-22 DIAGNOSIS — R079 Chest pain, unspecified: Secondary | ICD-10-CM | POA: Diagnosis not present

## 2019-08-22 DIAGNOSIS — I110 Hypertensive heart disease with heart failure: Secondary | ICD-10-CM | POA: Insufficient documentation

## 2019-08-22 DIAGNOSIS — I5022 Chronic systolic (congestive) heart failure: Secondary | ICD-10-CM | POA: Insufficient documentation

## 2019-08-22 DIAGNOSIS — Z950 Presence of cardiac pacemaker: Secondary | ICD-10-CM | POA: Diagnosis not present

## 2019-08-22 DIAGNOSIS — Z7982 Long term (current) use of aspirin: Secondary | ICD-10-CM | POA: Diagnosis not present

## 2019-08-22 DIAGNOSIS — R0789 Other chest pain: Secondary | ICD-10-CM | POA: Insufficient documentation

## 2019-08-22 DIAGNOSIS — Z79899 Other long term (current) drug therapy: Secondary | ICD-10-CM | POA: Insufficient documentation

## 2019-08-22 LAB — BASIC METABOLIC PANEL
Anion gap: 12 (ref 5–15)
BUN: 11 mg/dL (ref 6–20)
CO2: 22 mmol/L (ref 22–32)
Calcium: 9.1 mg/dL (ref 8.9–10.3)
Chloride: 102 mmol/L (ref 98–111)
Creatinine, Ser: 1.1 mg/dL — ABNORMAL HIGH (ref 0.44–1.00)
GFR calc Af Amer: >60 mL/min (ref >60)
GFR calc non Af Amer: 57 mL/min — ABNORMAL LOW (ref >60)
Glucose, Bld: 107 mg/dL — ABNORMAL HIGH (ref 70–99)
Potassium: 3.7 mmol/L (ref 3.5–5.1)
Sodium: 136 mmol/L (ref 135–145)

## 2019-08-22 LAB — CBC
HCT: 41.2 % (ref 36.0–46.0)
Hemoglobin: 13.2 g/dL (ref 12.0–15.0)
MCH: 29.3 pg (ref 26.0–34.0)
MCHC: 32 g/dL (ref 30.0–36.0)
MCV: 91.6 fL (ref 80.0–100.0)
Platelets: 275 10*3/uL (ref 150–400)
RBC: 4.5 MIL/uL (ref 3.87–5.11)
RDW: 13.6 % (ref 11.5–15.5)
WBC: 6.9 10*3/uL (ref 4.0–10.5)
nRBC: 0 % (ref 0.0–0.2)

## 2019-08-22 LAB — TROPONIN I (HIGH SENSITIVITY)
Troponin I (High Sensitivity): 4 ng/L (ref <18)
Troponin I (High Sensitivity): 4 ng/L (ref <18)

## 2019-08-22 NOTE — ED Notes (Signed)
Pt ambulatory from lobby to room with no reported distress.

## 2019-08-22 NOTE — ED Notes (Signed)
Discharge instructions discussed with Pt. Pt verbalized understanding. Pt stable and ambulatory.    

## 2019-08-22 NOTE — ED Triage Notes (Signed)
C/o chest pain x 3 days.  Has a pacemaker.  Reports feeling like it is indigestion and has taken tums and alka seltzer with no relief for three days.  Her daughter told her to come to ER since she has had no relief.  Reports diarrhea last night and this morning.

## 2019-08-22 NOTE — Discharge Instructions (Signed)
Try zantac or pepcid twice a day.  Try to avoid things that may make this worse, most commonly these are spicy foods tomato based products fatty foods chocolate and peppermint.  Alcohol and tobacco can also make this worse.  Return to the emergency department for sudden worsening pain fever or inability to eat or drink. ° °

## 2019-08-22 NOTE — ED Provider Notes (Signed)
[facility] EMERGENCY DEPARTMENT Provider Note   CSN: [id number] Arrival date & time: 08/22/19  1157     History   Chief Complaint Chief Complaint  Patient presents with  . Chest Pain    HPI Alexis Clay Patient is a 53 y.o. female.     53 yo F with a chief complaint of chest pain.  This been going on for about 3 to 4 days.  Seems to come and go.  Left-sided sharp and shooting last for about a second time and then resolves.  This time this occurs while the patient is eating or drinking.  She denies exertional symptoms.  Has a history of a pacemaker due to diminished EF.  She denies this is due to ischemia.  Denies history of MI.  Has a history of hypertension.  Denies hyperlipidemia diabetes smoking or family history of MI.  Denies history of PE or DVT denies hemoptysis denies unilateral lower extremity edema denies recent surgery immobilization estrogen use or cancer.  The history is provided by the patient.  Chest Pain Pain location:  L chest Pain quality: sharp and shooting   Pain radiates to:  Does not radiate Pain severity:  Moderate Onset quality:  Gradual Duration:  4 days Timing:  Intermittent Progression:  Waxing and waning Chronicity:  Recurrent Relieved by:  Nothing Worsened by:  Nothing (eating) Ineffective treatments:  None tried Associated symptoms: no dizziness, no fever, no headache, no nausea, no palpitations, no shortness of breath and no vomiting     Past Medical History:  Diagnosis Date  . Anxiety disorder   . Biventricular cardiac pacemaker in situ   . Bradycardia   . Cardiomyopathy (HCC)   . CHF (congestive heart failure) (HCC)   . Dilated cardiomyopathy (HCC)   . Headache   . Hypertension, benign   . LBBB (left bundle branch block)   . Panic disorder without agoraphobia   . Presence of permanent cardiac pacemaker   . Sinoatrial node dysfunction (HCC)   . Sinus node dysfunction (HCC)   . Supraventricular premature beats   .  SVT (supraventricular tachycardia) (HCC)   . Systolic heart failure (HCC)    Due to hypertension (LVEF 35-55% after med therapy & resynchronizatoin)    Patient Active Problem List   Diagnosis Date Noted  . Snoring 11/13/2015  . Biventricular cardiac pacemaker in situ 08/16/2014  . Chronic systolic heart failure (HCC) 08/16/2014  . Essential hypertension 08/16/2014    Past Surgical History:  Procedure Laterality Date  . BIV PACEMAKER GENERATOR CHANGEOUT N/A 07/01/2017   Procedure: BiV Pacemaker Generator Changeout;  Surgeon: Marinus Maw, MD;  Location: [facility] INVASIVE CV LAB;  Service: Cardiovascular;  Laterality: N/A;  . CARDIAC CATHETERIZATION  2011   With widely patent coronary arteries   . CESAREAN SECTION  1991  . COLONOSCOPY WITH PROPOFOL N/A 02/04/2016   Procedure: COLONOSCOPY WITH PROPOFOL;  Surgeon: Charna Elizabeth, MD;  Location: [facility] ENDOSCOPY;  Service: Endoscopy;  Laterality: N/A;  . LEEP  10/2010  . PACEMAKER INSERTION  12/26/2009   St. Jude BiV PM.  Model # [id number].  Serial # [id number]    . TUBAL LIGATION       OB History   No obstetric history on file.      Home Medications    Prior to Admission medications   Medication Sig Start Date End Date Taking? Authorizing Provider  aspirin EC 81 MG tablet Take 81 mg by mouth daily.    [provider]  carvedilol (COREG) 25 MG tablet Take 1 tablet (25 mg total) by mouth 2 (two) times daily. 03/16/19 06/23/19  Marinus Mawaylor, Gregg W, MD  lisinopril (ZESTRIL) 10 MG tablet Take 1 tablet (10 mg total) by mouth daily. 06/23/19 09/21/19  Berton BonHammond, Janine, NP  lisinopril-hydrochlorothiazide (PRINZIDE,ZESTORETIC) 20-25 MG tablet Take 1 tablet by mouth daily. 01/27/19   Marinus Mawaylor, Gregg W, MD    Family History Family History  Problem Relation Age of Onset  . Cancer Mother        cervical  . Heart disease Mother        CABGx3 in 2014  . CAD Father   . Congestive Heart Failure Father   . Hypertension Father   . Renal Disease Father         CKD, ESRD  . Hypertension Brother   . Hypertension Brother   . Hypertension Sister     Social History Social History   Tobacco Use  . Smoking status: Never Smoker  . Smokeless tobacco: Never Used  Substance Use Topics  . Alcohol use: Yes    Comment: occasionally  . Drug use: No     Allergies   Patient has no known allergies.   Review of Systems Review of Systems  Constitutional: Negative for chills and fever.  HENT: Negative for congestion and rhinorrhea.   Eyes: Negative for redness and visual disturbance.  Respiratory: Negative for shortness of breath and wheezing.   Cardiovascular: Positive for chest pain. Negative for palpitations.  Gastrointestinal: Negative for nausea and vomiting.  Genitourinary: Negative for dysuria and urgency.  Musculoskeletal: Negative for arthralgias and myalgias.  Skin: Negative for pallor and wound.  Neurological: Negative for dizziness and headaches.     Physical Exam Updated Vital Signs BP 122/70   Pulse (!) 57   Temp 98.5 F (36.9 C) (Oral)   Resp 12   SpO2 98%   Physical Exam Vitals signs and nursing note reviewed.  Constitutional:      General: She is not in acute distress.    Appearance: She is well-developed. She is not diaphoretic.  HENT:     Head: Normocephalic and atraumatic.  Eyes:     Pupils: Pupils are equal, round, and reactive to light.  Neck:     Musculoskeletal: Normal range of motion and neck supple.  Cardiovascular:     Rate and Rhythm: Normal rate and regular rhythm.     Heart sounds: No murmur. No friction rub. No gallop.   Pulmonary:     Effort: Pulmonary effort is normal.     Breath sounds: No wheezing or rales.  Abdominal:     General: There is no distension.     Palpations: Abdomen is soft.     Tenderness: There is no abdominal tenderness.  Musculoskeletal:        General: No tenderness.  Skin:    General: Skin is warm and dry.  Neurological:     Mental Status: She is alert and oriented  to person, place, and time.  Psychiatric:        Behavior: Behavior normal.      ED Treatments / Results  Labs (all labs ordered are listed, but only abnormal results are displayed) Labs Reviewed  BASIC METABOLIC PANEL - Abnormal; Notable for the following components:      Result Value   Glucose, Bld 107 (*)    Creatinine, Ser 1.10 (*)    GFR calc non Af Amer 57 (*)    All other  components within normal limits  CBC  TROPONIN I (HIGH SENSITIVITY)  TROPONIN I (HIGH SENSITIVITY)    EKG EKG Interpretation  Date/Time:  Tuesday August 22 2019 12:02:52 EDT Ventricular Rate:  81 PR Interval:  132 QRS Duration: 142 QT Interval:  406 QTC Calculation: 471 R Axis:   62 Text Interpretation:  Normal sinus rhythm Non-specific intra-ventricular conduction block Cannot rule out Inferior infarct , age undetermined Cannot rule out Anterior infarct , age undetermined Abnormal ECG No old tracing to compare Confirmed by Deno Etienne [phone]) on 08/22/2019 4:45:01 PM   Radiology Dg Chest 2 View  Result Date: 08/22/2019 CLINICAL DATA:  Chest pain today. EXAM: CHEST - 2 VIEW COMPARISON:  None. FINDINGS: Three lead pacing device is in place with leads in the right atrium, right ventricle and exiting the coronary sinus. Leads are intact. Lungs are clear. Heart size is normal. No pneumothorax or pleural fluid. No acute or focal bony abnormality. IMPRESSION: Negative chest.  Pacemaker in place. Electronically Signed   By: Inge Rise M.D.   On: 08/22/2019 12:45    Procedures Procedures (including critical care time)  Medications Ordered in ED Medications - No data to display   Initial Impression / Assessment and Plan / ED Course  I have reviewed the triage vital signs and the nursing notes.  Pertinent labs & imaging results that were available during my care of the patient were reviewed by me and considered in my medical decision making (see chart for details).        48 yoF with  atypical chest pain going on for about 4 days now.  Her delta troponin is negative.  EKG is non-concerning.  Chest x-ray viewed by me without focal infiltrate or pneumothorax.  She is not anemic.  There is no significant electrolyte abnormality.  At this point as it occurs with eating we will treat as possible reflux.  Have her follow-up with her PCP.  11:17 PM:  I have discussed the diagnosis/risks/treatment options with the patient and believe the pt to be eligible for discharge home to follow-up with PCP. We also discussed returning to the ED immediately if new or worsening sx occur. We discussed the sx which are most concerning (e.g., sudden worsening pain, fever, inability to tolerate by mouth) that necessitate immediate return. Medications administered to the patient during their visit and any new prescriptions provided to the patient are listed below.  Medications given during this visit Medications - No data to display   The patient appears reasonably screen and/or stabilized for discharge and I doubt any other medical condition or other [facility] requiring further screening, evaluation, or treatment in the ED at this time prior to discharge.   Final Clinical Impressions(s) / ED Diagnoses   Final diagnoses:  Atypical chest pain    ED Discharge Orders    None       Deno Etienne, DO 08/22/19 2318

## 2019-09-05 ENCOUNTER — Ambulatory Visit (INDEPENDENT_AMBULATORY_CARE_PROVIDER_SITE_OTHER): Payer: BC Managed Care – PPO | Admitting: *Deleted

## 2019-09-05 DIAGNOSIS — I5022 Chronic systolic (congestive) heart failure: Secondary | ICD-10-CM

## 2019-09-06 LAB — CUP PACEART REMOTE DEVICE CHECK
Battery Remaining Longevity: 94 mo
Battery Remaining Percentage: 95.5 %
Battery Voltage: 2.98 V
Brady Statistic AP VP Percent: 1.7 %
Brady Statistic AP VS Percent: 1 %
Brady Statistic AS VP Percent: 98 %
Brady Statistic AS VS Percent: 1 %
Brady Statistic RA Percent Paced: 1.6 %
Date Time Interrogation Session: 20200922194050
Implantable Lead Implant Date: 20110113
Implantable Lead Implant Date: 20110113
Implantable Lead Implant Date: 20110113
Implantable Lead Location: 753858
Implantable Lead Location: 753859
Implantable Lead Location: 753860
Implantable Pulse Generator Implant Date: 20180719
Lead Channel Impedance Value: 230 Ohm
Lead Channel Impedance Value: 450 Ohm
Lead Channel Impedance Value: 560 Ohm
Lead Channel Pacing Threshold Amplitude: 0.625 V
Lead Channel Pacing Threshold Amplitude: 0.75 V
Lead Channel Pacing Threshold Amplitude: 1.625 V
Lead Channel Pacing Threshold Pulse Width: 0.4 ms
Lead Channel Pacing Threshold Pulse Width: 0.4 ms
Lead Channel Pacing Threshold Pulse Width: 0.4 ms
Lead Channel Sensing Intrinsic Amplitude: 1.8 mV
Lead Channel Sensing Intrinsic Amplitude: 12 mV
Lead Channel Setting Pacing Amplitude: 1.625
Lead Channel Setting Pacing Amplitude: 2 V
Lead Channel Setting Pacing Amplitude: 2.625
Lead Channel Setting Pacing Pulse Width: 0.4 ms
Lead Channel Setting Pacing Pulse Width: 0.4 ms
Lead Channel Setting Sensing Sensitivity: 2 mV
Pulse Gen Model: 3222
Pulse Gen Serial Number: 8007164

## 2019-09-07 NOTE — Telephone Encounter (Signed)
Patient has a 10 week follow up appointment scheduled for 10/05/19 8:40. Patient understands she needs to keep this appointment for insurance compliance.  Left detailed message on voicemail with date and time and informed patient to call back to confirm or reschedule. [phone].

## 2019-09-13 DIAGNOSIS — G4733 Obstructive sleep apnea (adult) (pediatric): Secondary | ICD-10-CM | POA: Diagnosis not present

## 2019-09-15 NOTE — Progress Notes (Signed)
Remote pacemaker transmission.   

## 2019-10-03 ENCOUNTER — Telehealth: Payer: Self-pay | Admitting: *Deleted

## 2019-10-03 NOTE — Telephone Encounter (Signed)
lvm to call back to go over chart before visit  

## 2019-10-05 ENCOUNTER — Telehealth (INDEPENDENT_AMBULATORY_CARE_PROVIDER_SITE_OTHER): Payer: BC Managed Care – PPO | Admitting: Cardiology

## 2019-10-05 ENCOUNTER — Other Ambulatory Visit: Payer: Self-pay

## 2019-10-05 VITALS — Ht 66.0 in | Wt 218.0 lb

## 2019-10-05 DIAGNOSIS — I10 Essential (primary) hypertension: Secondary | ICD-10-CM | POA: Diagnosis not present

## 2019-10-05 DIAGNOSIS — G4730 Sleep apnea, unspecified: Secondary | ICD-10-CM

## 2019-10-05 DIAGNOSIS — E669 Obesity, unspecified: Secondary | ICD-10-CM

## 2019-10-05 NOTE — Progress Notes (Signed)
Virtual Visit via Video Note   This visit type was conducted due to national recommendations for restrictions regarding the COVID-19 Pandemic (e.g. social distancing) in an effort to limit this patient's exposure and mitigate transmission in our community.  Due to her co-morbid illnesses, this patient is at least at moderate risk for complications without adequate follow up.  This format is felt to be most appropriate for this patient at this time.  All issues noted in this document were discussed and addressed.  A limited physical exam was performed with this format.  Please refer to the patient's chart for her consent to telehealth for [facility].  Evaluation Performed:  Follow-up visit  This visit type was conducted due to national recommendations for restrictions regarding the COVID-19 Pandemic (e.g. social distancing).  This format is felt to be most appropriate for this patient at this time.  All issues noted in this document were discussed and addressed.  No physical exam was performed (except for noted visual exam findings with Video Visits).  Please refer to the patient's chart (MyChart message for video visits and phone note for telephone visits) for the patient's consent to telehealth for [facility].  Date:  10/05/2019   ID:  Alexis Clay, DOB 1966/08/13, MRN [id number]  Patient Location:  Home  Provider location:   [place]  PCP:  Lyn Records, MD  Cardiologist:  Lesleigh Noe, MD  Sleep Medicine:  Armanda Magic, MD Electrophysiologist:  None   Chief Complaint:  OSA  History of Present Illness:    Alexis Clay is a 53 y.o. female who presents via audio/video conferencing for a telehealth visit today.    This is a 53yo female with a hx of CHF, HTN, and bradycardia who was referred for split night sleep study due to snoring, witnessed apnea and excessive daytime sleepiness.  She underwent PSG showing moderate OSA with an AHI of 17/hr and underwent  CPAP titration to 12cm H2O.  She is doing well with her CPAP device and thinks that she has gotten used to it.  She tolerates the full face mask and feels the pressure is adequate.  Since going on CPAP she feels rested in the am and has no significant daytime sleepiness. She does have some mouth dryness  She does not think that he snores.    The patient does have symptoms concerning for COVID-19 infection (fever, chills, cough, or new shortness of breath).   Prior CV studies:   The following studies were reviewed today:  PAP compliance download  Past Medical History:  Diagnosis Date   Anxiety disorder    Biventricular cardiac pacemaker in situ    Bradycardia    Cardiomyopathy (HCC)    CHF (congestive heart failure) (HCC)    Dilated cardiomyopathy (HCC)    Headache    Hypertension, benign    LBBB (left bundle branch block)    Panic disorder without agoraphobia    Presence of permanent cardiac pacemaker    Sinoatrial node dysfunction (HCC)    Sinus node dysfunction (HCC)    Supraventricular premature beats    SVT (supraventricular tachycardia) (HCC)    Systolic heart failure (HCC)    Due to hypertension (LVEF 35-55% after med therapy & resynchronizatoin)   Past Surgical History:  Procedure Laterality Date   BIV PACEMAKER GENERATOR CHANGEOUT N/A 07/01/2017   Procedure: BiV Pacemaker Generator Changeout;  Surgeon: Marinus Maw, MD;  Location: [facility] INVASIVE CV LAB;  Service: Cardiovascular;  Laterality: N/A;  CARDIAC CATHETERIZATION  2011   With widely patent coronary arteries    CESAREAN SECTION  1991   COLONOSCOPY WITH PROPOFOL N/A 02/04/2016   Procedure: COLONOSCOPY WITH PROPOFOL;  Surgeon: Juanita Craver, MD;  Location: [facility] ENDOSCOPY;  Service: Endoscopy;  Laterality: N/A;   LEEP  10/2010   PACEMAKER INSERTION  12/26/2009   St. Jude BiV PM.  Model # [id number].  Serial # [id number]     TUBAL LIGATION       Current Meds  Medication Sig   aspirin EC 81 MG tablet  Take 81 mg by mouth daily.   carvedilol (COREG) 25 MG tablet Take 1 tablet (25 mg total) by mouth 2 (two) times daily.   lisinopril (ZESTRIL) 10 MG tablet Take 1 tablet (10 mg total) by mouth daily.   lisinopril-hydrochlorothiazide (PRINZIDE,ZESTORETIC) 20-25 MG tablet Take 1 tablet by mouth daily.     Allergies:   Patient has no known allergies.   Social History   Tobacco Use   Smoking status: Never Smoker   Smokeless tobacco: Never Used  Substance Use Topics   Alcohol use: Yes    Comment: occasionally   Drug use: No     Family Hx: The patient's family history includes CAD in her father; Cancer in her mother; Congestive Heart Failure in her father; Heart disease in her mother; Hypertension in her brother, brother, father, and sister; Renal Disease in her father.  ROS:   Please see the history of present illness.     All other systems reviewed and are negative.   Labs/Other Tests and Data Reviewed:    Recent Labs: 08/22/2019: BUN 11; Creatinine, Ser 1.10; Hemoglobin 13.2; Platelets 275; Potassium 3.7; Sodium 136   Recent Lipid Panel No results found for: CHOL, TRIG, HDL, CHOLHDL, LDLCALC, LDLDIRECT  Wt Readings from Last 3 Encounters:  10/05/19 218 lb (98.9 kg)  07/12/19 220 lb (99.8 kg)  06/23/19 227 lb (103 kg)     Objective:    Vital Signs:  Ht 5\' 6"  (1.676 m)    Wt 218 lb (98.9 kg)    BMI 35.19 kg/m    CONSTITUTIONAL:  Well nourished, well developed female in no acute distress.  EYES: anicteric MOUTH: oral mucosa is pink RESPIRATORY: Normal respiratory effort, symmetric expansion CARDIOVASCULAR: No peripheral edema SKIN: No rash, lesions or ulcers MUSCULOSKELETAL: no digital cyanosis NEURO: Cranial Nerves II-XII grossly intact, moves all extremities PSYCH: Intact judgement and insight.  A&O x 3, Mood/affect appropriate   ASSESSMENT & PLAN:    1.  OSA - The pathophysiology of obstructive sleep apnea , it's cardiovascular consequences & modes of  treatment including CPAP were discused with the patient in detail & they evidenced understanding.  The patient is tolerating PAP therapy well without any problems. The PAP download was reviewed today and showed an AHI of 1.5/hr on 12 cm H2O with 47% compliance in using more than 4 hours nightly.  The patient has been using and benefiting from PAP use and will continue to benefit from therapy.  I have discussed with her how to adjust the humidity.  I encouraged her to be more compliant with her device.  2.  HTN -BP controlled on exam -continue Carvedilol 25mg  BID, lisinopril 30mg  daily and HCTZ 25mg  daily.   3.  Obesity  -I have encouraged her to get into a routine exercise program and cut back on carbs and portions.   COVID-19 Education: The signs and symptoms of COVID-19 were discussed with the patient and  how to seek care for testing (follow up with PCP or arrange E-visit).  The importance of social distancing was discussed today.  Patient Risk:   After full review of this patient's clinical status, I feel that they are at least moderate risk at this time.  Time:   Today, I have spent 20 minutes directly with the patient on telemedicine discussing medical problems including OSA, HTN, obesity.  We also reviewed the symptoms of COVID 19 and the ways to protect against contracting the virus with telehealth technology.  I spent an additional 5 minutes reviewing patient's chart including PAP compliance download.  Medication Adjustments/Labs and Tests Ordered: Current medicines are reviewed at length with the patient today.  Concerns regarding medicines are outlined above.  Tests Ordered: No orders of the defined types were placed in this encounter.  Medication Changes: No orders of the defined types were placed in this encounter.   Disposition:  Follow up in 1 year(s)  Signed, Armanda Magicraci Zarianna Dicarlo, MD  10/05/2019 8:42 AM    [facility]

## 2019-10-05 NOTE — Patient Instructions (Signed)
Medication Instructions:  Your physician recommends that you continue on your current medications as directed. Please refer to the Current Medication list given to you today.  *If you need a refill on your cardiac medications before your next appointment, please call your pharmacy*  Lab Work: none If you have labs (blood work) drawn today and your tests are completely normal, you will receive your results only by: . MyChart Message (if you have MyChart) OR . A paper copy in the mail If you have any lab test that is abnormal or we need to change your treatment, we will call you to review the results.  Testing/Procedures: none  Follow-Up: At [facility], you and your health needs are our priority.  As part of our continuing mission to provide you with exceptional heart care, we have created designated Provider Care Teams.  These Care Teams include your primary Cardiologist (physician) and Advanced Practice Providers (APPs -  Physician Assistants and Nurse Practitioners) who all work together to provide you with the care you need, when you need it.  Your next appointment:   12 month(s)  The format for your next appointment:   In Person  Provider:   Traci Turner, MD  Other Instructions    

## 2019-10-14 DIAGNOSIS — G4733 Obstructive sleep apnea (adult) (pediatric): Secondary | ICD-10-CM | POA: Diagnosis not present

## 2019-11-13 DIAGNOSIS — G4733 Obstructive sleep apnea (adult) (pediatric): Secondary | ICD-10-CM | POA: Diagnosis not present

## 2019-11-19 ENCOUNTER — Emergency Department (HOSPITAL_COMMUNITY)
Admission: EM | Admit: 2019-11-19 | Discharge: 2019-11-19 | Disposition: A | Discharge status: Home / Self Care | Payer: BC Managed Care – PPO | Attending: Emergency Medicine | Admitting: Emergency Medicine

## 2019-11-19 ENCOUNTER — Encounter (HOSPITAL_COMMUNITY): Payer: Self-pay | Admitting: Emergency Medicine

## 2019-11-19 ENCOUNTER — Emergency Department (HOSPITAL_COMMUNITY): Payer: BC Managed Care – PPO

## 2019-11-19 ENCOUNTER — Other Ambulatory Visit: Payer: Self-pay

## 2019-11-19 DIAGNOSIS — Z79899 Other long term (current) drug therapy: Secondary | ICD-10-CM | POA: Diagnosis not present

## 2019-11-19 DIAGNOSIS — T82190A Other mechanical complication of cardiac electrode, initial encounter: Secondary | ICD-10-CM | POA: Insufficient documentation

## 2019-11-19 DIAGNOSIS — Z7982 Long term (current) use of aspirin: Secondary | ICD-10-CM | POA: Insufficient documentation

## 2019-11-19 DIAGNOSIS — R002 Palpitations: Secondary | ICD-10-CM | POA: Diagnosis not present

## 2019-11-19 DIAGNOSIS — T82897A Other specified complication of cardiac prosthetic devices, implants and grafts, initial encounter: Secondary | ICD-10-CM | POA: Diagnosis not present

## 2019-11-19 DIAGNOSIS — I110 Hypertensive heart disease with heart failure: Secondary | ICD-10-CM | POA: Diagnosis not present

## 2019-11-19 DIAGNOSIS — Y718 Miscellaneous cardiovascular devices associated with adverse incidents, not elsewhere classified: Secondary | ICD-10-CM | POA: Insufficient documentation

## 2019-11-19 DIAGNOSIS — I5022 Chronic systolic (congestive) heart failure: Secondary | ICD-10-CM | POA: Insufficient documentation

## 2019-11-19 DIAGNOSIS — T829XXA Unspecified complication of cardiac and vascular prosthetic device, implant and graft, initial encounter: Secondary | ICD-10-CM

## 2019-11-19 LAB — BASIC METABOLIC PANEL
Anion gap: 8 (ref 5–15)
BUN: 8 mg/dL (ref 6–20)
CO2: 23 mmol/L (ref 22–32)
Calcium: 8.8 mg/dL — ABNORMAL LOW (ref 8.9–10.3)
Chloride: 108 mmol/L (ref 98–111)
Creatinine, Ser: 0.8 mg/dL (ref 0.44–1.00)
GFR calc Af Amer: >60 mL/min (ref >60)
GFR calc non Af Amer: >60 mL/min (ref >60)
Glucose, Bld: 95 mg/dL (ref 70–99)
Potassium: 3.8 mmol/L (ref 3.5–5.1)
Sodium: 139 mmol/L (ref 135–145)

## 2019-11-19 LAB — CBC
HCT: 38.7 % (ref 36.0–46.0)
Hemoglobin: 12.2 g/dL (ref 12.0–15.0)
MCH: 28.8 pg (ref 26.0–34.0)
MCHC: 31.5 g/dL (ref 30.0–36.0)
MCV: 91.3 fL (ref 80.0–100.0)
Platelets: 258 10*3/uL (ref 150–400)
RBC: 4.24 MIL/uL (ref 3.87–5.11)
RDW: 13.6 % (ref 11.5–15.5)
WBC: 6.3 10*3/uL (ref 4.0–10.5)
nRBC: 0 % (ref 0.0–0.2)

## 2019-11-19 LAB — TROPONIN I (HIGH SENSITIVITY): Troponin I (High Sensitivity): 4 ng/L (ref <18)

## 2019-11-19 MED ORDER — SODIUM CHLORIDE 0.9% FLUSH: 3.0000 mL | Freq: Once | INTRAVENOUS | Status: DC

## 2019-11-19 NOTE — ED Triage Notes (Signed)
Pt reports feeling like palpitations at her pacemaker since 5:30am.  States she thought at first that the company must be checking it because of the feeling.  Denies pain.

## 2019-11-19 NOTE — ED Notes (Signed)
Pacemaker interrogated; call received from [employer] rep, states that an alert was recorded at 5:30a around the time pt reports feeling a change. Pt pacemaker mode was changed d/t "low impedence," will need to be changed back, rep can be at ED around 6p to assist.

## 2019-11-19 NOTE — ED Notes (Signed)
Patient verbalizes understanding of discharge instructions. Opportunity for questioning and answers were provided. Armband removed by staff, pt discharged from ED to home via POV  

## 2019-11-19 NOTE — ED Provider Notes (Signed)
[facility] Provider Note   CSN: [id number] Arrival date & time: 11/19/19  1419     History   Chief Complaint Chief Complaint  Patient presents with  . pacemaker problem    HPI Alexis Clay is a 53 y.o. female hx of CHF, cardiomyopathy, here presenting with problem with her pacemaker.  Patient states that she woke up around 5:30 AM and felt some fluttering of the left chest around the pacemaker site.  She denies any shocks. Denies any chest pain or shortness of breath.  No recent medication changes and denies any leg swelling.  Patient has a Information systems manageraint Jude pacemaker has been in there for years.     The history is provided by the patient.    Past Medical History:  Diagnosis Date  . Anxiety disorder   . Biventricular cardiac pacemaker in situ   . Bradycardia   . Cardiomyopathy (HCC)   . CHF (congestive heart failure) (HCC)   . Dilated cardiomyopathy (HCC)   . Headache   . Hypertension, benign   . LBBB (left bundle branch block)   . Panic disorder without agoraphobia   . Presence of permanent cardiac pacemaker   . Sinoatrial node dysfunction (HCC)   . Sinus node dysfunction (HCC)   . Supraventricular premature beats   . SVT (supraventricular tachycardia) (HCC)   . Systolic heart failure (HCC)    Due to hypertension (LVEF 35-55% after med therapy & resynchronizatoin)    Patient Active Problem List   Diagnosis Date Noted  . Snoring 11/13/2015  . Biventricular cardiac pacemaker in situ 08/16/2014  . Chronic systolic heart failure (HCC) 08/16/2014  . Essential hypertension 08/16/2014    Past Surgical History:  Procedure Laterality Date  . BIV PACEMAKER GENERATOR CHANGEOUT N/A 07/01/2017   Procedure: BiV Pacemaker Generator Changeout;  Surgeon: Marinus Mawaylor, Gregg W, MD;  [facility];  Service: Cardiovascular;  Laterality: N/A;  . CARDIAC CATHETERIZATION  2011   With widely patent coronary arteries   . CESAREAN  SECTION  1991  . COLONOSCOPY WITH PROPOFOL N/A 02/04/2016   Procedure: COLONOSCOPY WITH PROPOFOL;  Surgeon: Charna ElizabethJyothi Mann, MD;  Location: [facility] ENDOSCOPY;  Service: Endoscopy;  Laterality: N/A;  . LEEP  10/2010  . PACEMAKER INSERTION  12/26/2009   St. Jude BiV PM.  Model # [id number].  Serial # [id number]    . TUBAL LIGATION       OB History   No obstetric history on file.      Home Medications    Prior to Admission medications   Medication Sig Start Date End Date Taking? Authorizing Provider  aspirin EC 81 MG tablet Take 81 mg by mouth daily.    [provider]  carvedilol (COREG) 25 MG tablet Take 1 tablet (25 mg total) by mouth 2 (two) times daily. 03/16/19 10/05/19  Marinus Mawaylor, Gregg W, MD  lisinopril (ZESTRIL) 10 MG tablet Take 1 tablet (10 mg total) by mouth daily. 06/23/19 10/05/19  Berton BonHammond, Janine, NP  lisinopril-hydrochlorothiazide (PRINZIDE,ZESTORETIC) 20-25 MG tablet Take 1 tablet by mouth daily. 01/27/19   Marinus Mawaylor, Gregg W, MD    Family History Family History  Problem Relation Age of Onset  . Cancer Mother        cervical  . Heart disease Mother        CABGx3 in 2014  . CAD Father   . Congestive Heart Failure Father   . Hypertension Father   . Renal Disease Father  CKD, ESRD  . Hypertension Brother   . Hypertension Brother   . Hypertension Sister     Social History Social History   Tobacco Use  . Smoking status: Never Smoker  . Smokeless tobacco: Never Used  Substance Use Topics  . Alcohol use: Yes    Comment: occasionally  . Drug use: No     Allergies   Patient has no known allergies.   Review of Systems Review of Systems  Cardiovascular: Positive for palpitations.       Pacemaker problem   All other systems reviewed and are negative.    Physical Exam Updated Vital Signs BP (!) 145/66   Pulse (!) 58   Resp 18   LMP 10/22/2019   SpO2 99%   Physical Exam Vitals signs and nursing note reviewed.  Constitutional:      Appearance: Normal  appearance.  HENT:     Head: Normocephalic.     Nose: Nose normal.     Mouth/Throat:     Mouth: Mucous membranes are moist.  Eyes:     Extraocular Movements: Extraocular movements intact.     Pupils: Pupils are equal, round, and reactive to light.  Neck:     Musculoskeletal: Normal range of motion.  Cardiovascular:     Rate and Rhythm: Normal rate and regular rhythm.     Comments: Pacemaker in place  Abdominal:     General: Abdomen is flat.     Palpations: Abdomen is soft.  Musculoskeletal: Normal range of motion.  Skin:    General: Skin is warm.     Capillary Refill: Capillary refill takes less than 2 seconds.  Neurological:     General: No focal deficit present.     Mental Status: She is alert and oriented to person, place, and time.  Psychiatric:        Mood and Affect: Mood normal.        Behavior: Behavior normal.      ED Treatments / Results  Labs (all labs ordered are listed, but only abnormal results are displayed) Labs Reviewed  BASIC METABOLIC PANEL - Abnormal; Notable for the following components:      Result Value   Calcium 8.8 (*)    All other components within normal limits  CBC  TROPONIN I (HIGH SENSITIVITY)    EKG EKG Interpretation  Date/Time:  Sunday November 19 2019 14:29:03 EST Ventricular Rate:  74 PR Interval:  136 QRS Duration: 134 QT Interval:  396 QTC Calculation: 439 R Axis:   -56 Text Interpretation: Atrial-sensed ventricular-paced rhythm Biventricular pacemaker detected Abnormal ECG No previous ECGs available Confirmed by Wandra Arthurs [phone]) on 11/19/2019 3:10:17 PM   Radiology Dg Chest 2 View  Result Date: 11/19/2019 CLINICAL DATA:  Palpitations EXAM: CHEST - 2 VIEW COMPARISON:  Chest radiograph dated 08/22/2019 FINDINGS: A left subclavian approach cardiac device is redemonstrated. The heart size and mediastinal contours are within normal limits. Both lungs are clear. The visualized skeletal structures are unremarkable.  IMPRESSION: No active cardiopulmonary disease. Electronically Signed   By: Zerita Boers M.D.   On: 11/19/2019 15:32    Procedures Procedures (including critical care time)  Medications Ordered in ED Medications  sodium chloride flush (NS) 0.9 % injection 3 mL (3 mLs Intravenous Not Given 11/19/19 1626)     Initial Impression / Assessment and Plan / ED Course  I have reviewed the triage vital signs and the nursing notes.  Pertinent labs & imaging results that were available during  my care of the patient were reviewed by me and considered in my medical decision making (see chart for details).       Alexis Clay is a 53 y.o. female here with pacemaker problem. No recent medication changes. EKG showed paced rhythm. Will get CXR to make sure that there are no lead fractures. Will check labs and interrogate pacemaker.   6:14 PM xrays showed pace maker leads are in place.  The rep called and apparently the pacemaker went into a different mode so the rep came and changed it here.  Patient's labs are unremarkable and she feels great. Will have her follow up with cardiology outpatient    Final Clinical Impressions(s) / ED Diagnoses   Final diagnoses:  None    ED Discharge Orders    None       Charlynne Pander, MD 11/19/19 1815

## 2019-11-19 NOTE — Discharge Instructions (Addendum)
Your pacemaker went into a different mode and now it is back into the correct mode   Follow up with your cardiologist   Return to ER if you have palpitations, chest pain, trouble breathing

## 2019-11-20 ENCOUNTER — Telehealth: Payer: BC Managed Care – PPO

## 2019-11-20 NOTE — Telephone Encounter (Signed)
See other MyChart message from 11/19/19

## 2019-11-20 NOTE — Telephone Encounter (Signed)
Spoke with patient. Palpitations were likely due to pectoral stim as patient's RV lead polarity-switched to unipolar on 11/19/19 due to low bipolar impedance (200ohms, previously running ~260ohms). Unipolar impedance was 180ohms. Pt was seen in ED on 11/19/19, [facility] representative reprogrammed lead back to bipolar, lowered polarity switch criteria to 150ohms.  Explained cause of vibratory alert and reprogramming to patient. Advised to call our office if she experiences any future vibratory alerts. Pt verbalizes understanding and agreement with plan. Message sent to scheduler as pt is due for f/u with Dr. Lovena Le. Routed to Dr. Lovena Le as Juluis Rainier.

## 2019-11-21 ENCOUNTER — Telehealth: Payer: Self-pay | Admitting: Emergency Medicine

## 2019-11-21 NOTE — Telephone Encounter (Signed)
LMOM to call clinic. ED 11/19/19 for episode similar to event recorded on 11/21/19 transmission. Assess for symptoms and overdue for f/u with Dr Lovena Le. patAlert for 2 AMS events thaton 11/20/19 with RVR in 160s., longest episode was 5 min and 12 sec, FFRWs noted on atrial lead. 1 HVR recorded with FFRWs noted on atrial lead that lasted 2 min and 2 seconds.

## 2019-11-24 ENCOUNTER — Other Ambulatory Visit: Payer: Self-pay | Admitting: Internal Medicine

## 2019-12-05 ENCOUNTER — Ambulatory Visit (INDEPENDENT_AMBULATORY_CARE_PROVIDER_SITE_OTHER): Payer: BC Managed Care – PPO | Admitting: *Deleted

## 2019-12-05 DIAGNOSIS — I5022 Chronic systolic (congestive) heart failure: Secondary | ICD-10-CM | POA: Diagnosis not present

## 2019-12-05 LAB — CUP PACEART REMOTE DEVICE CHECK
Battery Remaining Longevity: 83 mo
Battery Remaining Percentage: 95.5 %
Battery Voltage: 2.98 V
Brady Statistic AP VP Percent: 1.4 %
Brady Statistic AP VS Percent: 1 %
Brady Statistic AS VP Percent: 98 %
Brady Statistic AS VS Percent: 1 %
Brady Statistic RA Percent Paced: 1.4 %
Date Time Interrogation Session: 20201222052448
Implantable Lead Implant Date: 20110113
Implantable Lead Implant Date: 20110113
Implantable Lead Implant Date: 20110113
Implantable Lead Location: 753858
Implantable Lead Location: 753859
Implantable Lead Location: 753860
Implantable Pulse Generator Implant Date: 20180719
Lead Channel Impedance Value: 210 Ohm
Lead Channel Impedance Value: 440 Ohm
Lead Channel Impedance Value: 540 Ohm
Lead Channel Pacing Threshold Amplitude: 0.75 V
Lead Channel Pacing Threshold Amplitude: 1 V
Lead Channel Pacing Threshold Amplitude: 1.25 V
Lead Channel Pacing Threshold Pulse Width: 0.4 ms
Lead Channel Pacing Threshold Pulse Width: 0.5 ms
Lead Channel Pacing Threshold Pulse Width: 0.5 ms
Lead Channel Sensing Intrinsic Amplitude: 11.5 mV
Lead Channel Sensing Intrinsic Amplitude: 2.8 mV
Lead Channel Setting Pacing Amplitude: 1.75 V
Lead Channel Setting Pacing Amplitude: 2 V
Lead Channel Setting Pacing Amplitude: 2.5 V
Lead Channel Setting Pacing Pulse Width: 0.5 ms
Lead Channel Setting Pacing Pulse Width: 0.5 ms
Lead Channel Setting Sensing Sensitivity: 2 mV
Pulse Gen Model: 3222
Pulse Gen Serial Number: 8007164

## 2020-01-18 ENCOUNTER — Encounter: Payer: BC Managed Care – PPO | Admitting: Internal Medicine

## 2020-02-27 ENCOUNTER — Other Ambulatory Visit: Payer: Self-pay | Admitting: Internal Medicine

## 2020-02-27 ENCOUNTER — Telehealth: Payer: Self-pay

## 2020-02-27 NOTE — Telephone Encounter (Signed)
PM alert received for [facility], EGMs show multiple episodes of SVT 1:1 rates occurring 150-170.  Pt does have history of this, she was due for OV after last episodes, it appears that ot had appt but cancelled it.  Current meds include Carvedilol 25mg  BID and ASA81 mg daily.   Attemtped to reach pt to discuss episodes, symptoms and meds and advise need to reschedule missed appt.  No answer, left VM requesting callback.    Sending to MD for review, also send to scheduling to f/u with appt for pt.

## 2020-03-04 NOTE — Telephone Encounter (Signed)
No change. Will discuss SVT when she returns for followup. GT

## 2020-03-05 ENCOUNTER — Ambulatory Visit (INDEPENDENT_AMBULATORY_CARE_PROVIDER_SITE_OTHER): Payer: BC Managed Care – PPO | Admitting: *Deleted

## 2020-03-05 DIAGNOSIS — I5022 Chronic systolic (congestive) heart failure: Secondary | ICD-10-CM

## 2020-03-05 LAB — CUP PACEART REMOTE DEVICE CHECK
Battery Remaining Longevity: 82 mo
Battery Remaining Percentage: 95.5 %
Battery Voltage: 2.98 V
Brady Statistic AP VP Percent: 1.3 %
Brady Statistic AP VS Percent: 1 %
Brady Statistic AS VP Percent: 99 %
Brady Statistic AS VS Percent: 1 %
Brady Statistic RA Percent Paced: 1.2 %
Date Time Interrogation Session: 20210323050143
Implantable Lead Implant Date: 20110113
Implantable Lead Implant Date: 20110113
Implantable Lead Implant Date: 20110113
Implantable Lead Location: 753858
Implantable Lead Location: 753859
Implantable Lead Location: 753860
Implantable Pulse Generator Implant Date: 20180719
Lead Channel Impedance Value: 210 Ohm
Lead Channel Impedance Value: 450 Ohm
Lead Channel Impedance Value: 490 Ohm
Lead Channel Pacing Threshold Amplitude: 0.625 V
Lead Channel Pacing Threshold Amplitude: 1 V
Lead Channel Pacing Threshold Amplitude: 1.25 V
Lead Channel Pacing Threshold Pulse Width: 0.4 ms
Lead Channel Pacing Threshold Pulse Width: 0.5 ms
Lead Channel Pacing Threshold Pulse Width: 0.5 ms
Lead Channel Sensing Intrinsic Amplitude: 11.5 mV
Lead Channel Sensing Intrinsic Amplitude: 2.6 mV
Lead Channel Setting Pacing Amplitude: 1.625
Lead Channel Setting Pacing Amplitude: 2 V
Lead Channel Setting Pacing Amplitude: 2.5 V
Lead Channel Setting Pacing Pulse Width: 0.5 ms
Lead Channel Setting Pacing Pulse Width: 0.5 ms
Lead Channel Setting Sensing Sensitivity: 2 mV
Pulse Gen Model: 3222
Pulse Gen Serial Number: 8007164

## 2020-03-06 NOTE — Progress Notes (Signed)
PPM Remote  

## 2020-03-28 ENCOUNTER — Other Ambulatory Visit: Payer: Self-pay | Admitting: Internal Medicine

## 2020-04-02 ENCOUNTER — Other Ambulatory Visit: Payer: Self-pay

## 2020-04-02 ENCOUNTER — Ambulatory Visit (INDEPENDENT_AMBULATORY_CARE_PROVIDER_SITE_OTHER): Payer: BC Managed Care – PPO | Admitting: Internal Medicine

## 2020-04-02 ENCOUNTER — Encounter: Payer: Self-pay | Admitting: Internal Medicine

## 2020-04-02 VITALS — BP 142/88 | HR 72 | Ht 66.0 in

## 2020-04-02 DIAGNOSIS — I5022 Chronic systolic (congestive) heart failure: Secondary | ICD-10-CM

## 2020-04-02 DIAGNOSIS — Z950 Presence of cardiac pacemaker: Secondary | ICD-10-CM | POA: Diagnosis not present

## 2020-04-02 DIAGNOSIS — I495 Sick sinus syndrome: Secondary | ICD-10-CM | POA: Insufficient documentation

## 2020-04-02 NOTE — Patient Instructions (Signed)
Medication Instructions:  °Your physician recommends that you continue on your current medications as directed. Please refer to the Current Medication list given to you today. ° °Labwork: °None ordered. ° °Testing/Procedures: °None ordered. ° °Follow-Up: °Your physician wants you to follow-up in: one year with Dr. Taylor.   You will receive a reminder letter in the mail two months in advance. If you don't receive a letter, please call our office to schedule the follow-up appointment. ° °Remote monitoring is used to monitor your Pacemaker from home. This monitoring reduces the number of office visits required to check your device to one time per year. It allows us to keep an eye on the functioning of your device to ensure it is working properly. You are scheduled for a device check from home on 06/04/2020. You may send your transmission at any time that day. If you have a wireless device, the transmission will be sent automatically. After your physician reviews your transmission, you will receive a postcard with your next transmission date. ° °Any Other Special Instructions Will Be Listed Below (If Applicable). ° °If you need a refill on your cardiac medications before your next appointment, please call your pharmacy.  ° °

## 2020-04-02 NOTE — Progress Notes (Signed)
HPI Mrs. Roadcap returns today for followup. She is a pleasant 54 yo woman with a h/o HTN, obesity and chronic systolic heart failure, s/p biv PPM insertion. She has been stable. She denies chest pain and her CHF is class 2A. She denies edema. Her Biv PPM has been in place about 3 years. Prior she had a dual chamber PPM. She has had a reduction in the RV pacing impedence. She denies syncope. No Known Allergies   Current Outpatient Medications  Medication Sig Dispense Refill  . aspirin EC 81 MG tablet Take 81 mg by mouth daily.    . carvedilol (COREG) 25 MG tablet TAKE 1 TABLET BY MOUTH TWICE A DAY 180 tablet 0  . lisinopril-hydrochlorothiazide (ZESTORETIC) 20-25 MG tablet TAKE 1 TABLET BY MOUTH EVERY DAY 90 tablet 2  . lisinopril (ZESTRIL) 10 MG tablet Take 1 tablet (10 mg total) by mouth daily. 90 tablet 3   No current facility-administered medications for this visit.     Past Medical History:  Diagnosis Date  . Anxiety disorder   . Biventricular cardiac pacemaker in situ   . Bradycardia   . Cardiomyopathy (HCC)   . CHF (congestive heart failure) (HCC)   . Dilated cardiomyopathy (HCC)   . Headache   . Hypertension, benign   . LBBB (left bundle branch block)   . Panic disorder without agoraphobia   . Presence of permanent cardiac pacemaker   . Sinoatrial node dysfunction (HCC)   . Sinus node dysfunction (HCC)   . Supraventricular premature beats   . SVT (supraventricular tachycardia) (HCC)   . Systolic heart failure (HCC)    Due to hypertension (LVEF 35-55% after med therapy & resynchronizatoin)    ROS:   All systems reviewed and negative except as noted in the HPI.   Past Surgical History:  Procedure Laterality Date  . BIV PACEMAKER GENERATOR CHANGEOUT N/A 07/01/2017   Procedure: BiV Pacemaker Generator Changeout;  Surgeon: Marinus Maw, MD;  Location: [facility] INVASIVE CV LAB;  Service: Cardiovascular;  Laterality: N/A;  . CARDIAC CATHETERIZATION  2011   With  widely patent coronary arteries   . CESAREAN SECTION  1991  . COLONOSCOPY WITH PROPOFOL N/A 02/04/2016   Procedure: COLONOSCOPY WITH PROPOFOL;  Surgeon: Charna Elizabeth, MD;  Location: [facility] ENDOSCOPY;  Service: Endoscopy;  Laterality: N/A;  . LEEP  10/2010  . PACEMAKER INSERTION  12/26/2009   St. Jude BiV PM.  Model # [id number].  Serial # [id number]    . TUBAL LIGATION       Family History  Problem Relation Age of Onset  . Cancer Mother        cervical  . Heart disease Mother        CABGx3 in 2014  . CAD Father   . Congestive Heart Failure Father   . Hypertension Father   . Renal Disease Father        CKD, ESRD  . Hypertension Brother   . Hypertension Brother   . Hypertension Sister      Social History   Socioeconomic History  . Marital status: Divorced    Spouse name: Not on file  . Number of children: Not on file  . Years of education: Not on file  . Highest education level: Not on file  Occupational History  . Not on file  Tobacco Use  . Smoking status: Never Smoker  . Smokeless tobacco: Never Used  Substance and Sexual Activity  . Alcohol use: Yes  Comment: occasionally  . Drug use: No  . Sexual activity: Not on file  Other Topics Concern  . Not on file  Social History Narrative  . Not on file   Social Determinants of Health   Financial Resource Strain:   . Difficulty of Paying Living Expenses:   Food Insecurity:   . Worried About Charity fundraiser in the Last Year:   . Arboriculturist in the Last Year:   Transportation Needs:   . Film/video editor (Medical):   Marland Kitchen Lack of Transportation (Non-Medical):   Physical Activity:   . Days of Exercise per Week:   . Minutes of Exercise per Session:   Stress:   . Feeling of Stress :   Social Connections:   . Frequency of Communication with Friends and Family:   . Frequency of Social Gatherings with Friends and Family:   . Attends Religious Services:   . Active Member of Clubs or Organizations:   . Attends  Archivist Meetings:   Marland Kitchen Marital Status:   Intimate Partner Violence:   . Fear of Current or Ex-Partner:   . Emotionally Abused:   Marland Kitchen Physically Abused:   . Sexually Abused:      BP (!) 142/88   Pulse 72   Ht 5\' 6"  (1.676 m)   SpO2 98%   BMI 35.19 kg/m   Physical Exam:  Well appearing NAD HEENT: Unremarkable Neck:  No JVD, no thyromegally Lymphatics:  No adenopathy Back:  No CVA tenderness Lungs:  Clear with no wheezes HEART:  Regular rate rhythm, no murmurs, no rubs, no clicks Abd:  soft, positive bowel sounds, no organomegally, no rebound, no guarding Ext:  2 plus pulses, no edema, no cyanosis, no clubbing Skin:  No rashes no nodules Neuro:  CN II through XII intact, motor grossly intact  DEVICE  Normal device function.  See PaceArt for details. Reduced RV pacing impedence  Assess/Plan: 1. Chronic systolic heart failure -her symptoms are class 2. She will continue her current meds. 2. Obesity - she is encouraged to lose 15 lbs in the coming year. 3. 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 I have asked that she undergo watchful waiting. The impedence is in the low 200's and this will result in some premature battery depletion.  Cristopher Peru, MD

## 2020-05-24 DIAGNOSIS — Z6836 Body mass index (BMI) 36.0-36.9, adult: Secondary | ICD-10-CM | POA: Diagnosis not present

## 2020-05-24 DIAGNOSIS — E559 Vitamin D deficiency, unspecified: Secondary | ICD-10-CM | POA: Diagnosis not present

## 2020-05-24 DIAGNOSIS — Z131 Encounter for screening for diabetes mellitus: Secondary | ICD-10-CM | POA: Diagnosis not present

## 2020-05-24 DIAGNOSIS — I10 Essential (primary) hypertension: Secondary | ICD-10-CM | POA: Diagnosis not present

## 2020-05-24 DIAGNOSIS — Z79899 Other long term (current) drug therapy: Secondary | ICD-10-CM | POA: Diagnosis not present

## 2020-05-24 DIAGNOSIS — Z13220 Encounter for screening for lipoid disorders: Secondary | ICD-10-CM | POA: Diagnosis not present

## 2020-05-24 DIAGNOSIS — E669 Obesity, unspecified: Secondary | ICD-10-CM | POA: Diagnosis not present

## 2020-05-24 DIAGNOSIS — G4730 Sleep apnea, unspecified: Secondary | ICD-10-CM | POA: Diagnosis not present

## 2020-05-30 ENCOUNTER — Other Ambulatory Visit: Payer: Self-pay

## 2020-05-30 ENCOUNTER — Encounter (HOSPITAL_COMMUNITY): Payer: Self-pay

## 2020-05-30 ENCOUNTER — Emergency Department (HOSPITAL_COMMUNITY): Payer: BC Managed Care – PPO

## 2020-05-30 ENCOUNTER — Emergency Department (HOSPITAL_COMMUNITY)
Admission: EM | Admit: 2020-05-30 | Discharge: 2020-05-31 | Disposition: A | Discharge status: Home / Self Care | Payer: BC Managed Care – PPO | Attending: Emergency Medicine | Admitting: Emergency Medicine

## 2020-05-30 DIAGNOSIS — M25512 Pain in left shoulder: Secondary | ICD-10-CM | POA: Insufficient documentation

## 2020-05-30 DIAGNOSIS — Z5321 Procedure and treatment not carried out due to patient leaving prior to being seen by health care provider: Secondary | ICD-10-CM | POA: Diagnosis not present

## 2020-05-30 LAB — I-STAT BETA HCG BLOOD, ED (MC, WL, AP ONLY): I-stat hCG, quantitative: <5 m[IU]/mL (ref <5)

## 2020-05-30 LAB — CBC
HCT: 43.3 % (ref 36.0–46.0)
Hemoglobin: 13.7 g/dL (ref 12.0–15.0)
MCH: 28.8 pg (ref 26.0–34.0)
MCHC: 31.6 g/dL (ref 30.0–36.0)
MCV: 91 fL (ref 80.0–100.0)
Platelets: 296 10*3/uL (ref 150–400)
RBC: 4.76 MIL/uL (ref 3.87–5.11)
RDW: 13.6 % (ref 11.5–15.5)
WBC: 7.9 10*3/uL (ref 4.0–10.5)
nRBC: 0 % (ref 0.0–0.2)

## 2020-05-30 LAB — TROPONIN I (HIGH SENSITIVITY): Troponin I (High Sensitivity): 4 ng/L (ref <18)

## 2020-05-30 LAB — BASIC METABOLIC PANEL
Anion gap: 10 (ref 5–15)
BUN: 14 mg/dL (ref 6–20)
CO2: 28 mmol/L (ref 22–32)
Calcium: 9.5 mg/dL (ref 8.9–10.3)
Chloride: 101 mmol/L (ref 98–111)
Creatinine, Ser: 1.01 mg/dL — ABNORMAL HIGH (ref 0.44–1.00)
GFR calc Af Amer: >60 mL/min (ref >60)
GFR calc non Af Amer: >60 mL/min (ref >60)
Glucose, Bld: 96 mg/dL (ref 70–99)
Potassium: 4 mmol/L (ref 3.5–5.1)
Sodium: 139 mmol/L (ref 135–145)

## 2020-05-30 MED ORDER — SODIUM CHLORIDE 0.9% FLUSH: 3.0000 mL | Freq: Once | INTRAVENOUS | Status: DC

## 2020-05-30 NOTE — ED Triage Notes (Signed)
Pt arrives to ED w/ c/o 10/10 L shoulder pain that started 3 days ago. Pt has hx of pacemaker placement d/t bradycardia. Pt denies chest pain, sob at this time.

## 2020-05-31 LAB — TROPONIN I (HIGH SENSITIVITY): Troponin I (High Sensitivity): 3 ng/L (ref <18)

## 2020-05-31 NOTE — ED Notes (Signed)
Pt stated they were leaving  

## 2020-06-04 ENCOUNTER — Ambulatory Visit (INDEPENDENT_AMBULATORY_CARE_PROVIDER_SITE_OTHER): Payer: BC Managed Care – PPO | Admitting: *Deleted

## 2020-06-04 DIAGNOSIS — I5022 Chronic systolic (congestive) heart failure: Secondary | ICD-10-CM

## 2020-06-05 ENCOUNTER — Telehealth: Payer: Self-pay

## 2020-06-05 LAB — CUP PACEART REMOTE DEVICE CHECK
Battery Remaining Longevity: 80 mo
Battery Remaining Percentage: 95.5 %
Battery Voltage: 2.98 V
Brady Statistic AP VP Percent: 1 %
Brady Statistic AP VS Percent: 1 %
Brady Statistic AS VP Percent: 99 %
Brady Statistic AS VS Percent: 1 %
Brady Statistic RA Percent Paced: 1 %
Date Time Interrogation Session: 20210622034358
Implantable Lead Implant Date: 20110113
Implantable Lead Implant Date: 20110113
Implantable Lead Implant Date: 20110113
Implantable Lead Location: 753858
Implantable Lead Location: 753859
Implantable Lead Location: 753860
Implantable Pulse Generator Implant Date: 20180719
Lead Channel Impedance Value: 230 Ohm
Lead Channel Impedance Value: 460 Ohm
Lead Channel Impedance Value: 540 Ohm
Lead Channel Pacing Threshold Amplitude: 0.75 V
Lead Channel Pacing Threshold Amplitude: 1 V
Lead Channel Pacing Threshold Amplitude: 1.5 V
Lead Channel Pacing Threshold Pulse Width: 0.4 ms
Lead Channel Pacing Threshold Pulse Width: 0.5 ms
Lead Channel Pacing Threshold Pulse Width: 0.5 ms
Lead Channel Sensing Intrinsic Amplitude: 12 mV
Lead Channel Sensing Intrinsic Amplitude: 2.6 mV
Lead Channel Setting Pacing Amplitude: 1.75 V
Lead Channel Setting Pacing Amplitude: 2 V
Lead Channel Setting Pacing Amplitude: 2.5 V
Lead Channel Setting Pacing Pulse Width: 0.5 ms
Lead Channel Setting Pacing Pulse Width: 0.6 ms
Lead Channel Setting Sensing Sensitivity: 2 mV
Pulse Gen Model: 3222
Pulse Gen Serial Number: 8007164

## 2020-06-05 NOTE — Telephone Encounter (Signed)
Spoke with patient to remind of missed remote transmission 

## 2020-06-06 NOTE — Progress Notes (Signed)
Remote pacemaker transmission.   

## 2020-06-13 DIAGNOSIS — G4733 Obstructive sleep apnea (adult) (pediatric): Secondary | ICD-10-CM | POA: Diagnosis not present

## 2020-06-23 ENCOUNTER — Other Ambulatory Visit: Payer: Self-pay | Admitting: Internal Medicine

## 2020-07-15 DIAGNOSIS — E669 Obesity, unspecified: Secondary | ICD-10-CM | POA: Diagnosis not present

## 2020-07-15 DIAGNOSIS — Z6834 Body mass index (BMI) 34.0-34.9, adult: Secondary | ICD-10-CM | POA: Diagnosis not present

## 2020-07-15 DIAGNOSIS — Z713 Dietary counseling and surveillance: Secondary | ICD-10-CM | POA: Diagnosis not present

## 2020-07-24 DIAGNOSIS — I10 Essential (primary) hypertension: Secondary | ICD-10-CM | POA: Diagnosis not present

## 2020-07-24 DIAGNOSIS — E669 Obesity, unspecified: Secondary | ICD-10-CM | POA: Diagnosis not present

## 2020-07-24 DIAGNOSIS — G4730 Sleep apnea, unspecified: Secondary | ICD-10-CM | POA: Diagnosis not present

## 2020-07-24 DIAGNOSIS — Z6834 Body mass index (BMI) 34.0-34.9, adult: Secondary | ICD-10-CM | POA: Diagnosis not present

## 2020-09-03 ENCOUNTER — Ambulatory Visit (INDEPENDENT_AMBULATORY_CARE_PROVIDER_SITE_OTHER): Payer: BC Managed Care – PPO | Admitting: *Deleted

## 2020-09-03 DIAGNOSIS — I5022 Chronic systolic (congestive) heart failure: Secondary | ICD-10-CM

## 2020-09-03 DIAGNOSIS — G4730 Sleep apnea, unspecified: Secondary | ICD-10-CM | POA: Insufficient documentation

## 2020-09-03 DIAGNOSIS — Z6834 Body mass index (BMI) 34.0-34.9, adult: Secondary | ICD-10-CM | POA: Diagnosis not present

## 2020-09-03 DIAGNOSIS — Z01419 Encounter for gynecological examination (general) (routine) without abnormal findings: Secondary | ICD-10-CM | POA: Diagnosis not present

## 2020-09-03 DIAGNOSIS — G4733 Obstructive sleep apnea (adult) (pediatric): Secondary | ICD-10-CM | POA: Insufficient documentation

## 2020-09-03 LAB — CUP PACEART REMOTE DEVICE CHECK
Battery Remaining Longevity: 80 mo
Battery Remaining Percentage: 95.5 %
Battery Voltage: 2.98 V
Brady Statistic AP VP Percent: 1 %
Brady Statistic AP VS Percent: 1 %
Brady Statistic AS VP Percent: 99 %
Brady Statistic AS VS Percent: 1 %
Brady Statistic RA Percent Paced: 1 %
Date Time Interrogation Session: 20210921055719
Implantable Lead Implant Date: 20110113
Implantable Lead Implant Date: 20110113
Implantable Lead Implant Date: 20110113
Implantable Lead Location: 753858
Implantable Lead Location: 753859
Implantable Lead Location: 753860
Implantable Pulse Generator Implant Date: 20180719
Lead Channel Impedance Value: 230 Ohm
Lead Channel Impedance Value: 460 Ohm
Lead Channel Impedance Value: 550 Ohm
Lead Channel Pacing Threshold Amplitude: 0.75 V
Lead Channel Pacing Threshold Amplitude: 1 V
Lead Channel Pacing Threshold Amplitude: 1.5 V
Lead Channel Pacing Threshold Pulse Width: 0.4 ms
Lead Channel Pacing Threshold Pulse Width: 0.5 ms
Lead Channel Pacing Threshold Pulse Width: 0.5 ms
Lead Channel Sensing Intrinsic Amplitude: 12 mV
Lead Channel Sensing Intrinsic Amplitude: 2.8 mV
Lead Channel Setting Pacing Amplitude: 1.75 V
Lead Channel Setting Pacing Amplitude: 2 V
Lead Channel Setting Pacing Amplitude: 2.5 V
Lead Channel Setting Pacing Pulse Width: 0.5 ms
Lead Channel Setting Pacing Pulse Width: 0.6 ms
Lead Channel Setting Sensing Sensitivity: 2 mV
Pulse Gen Model: 3222
Pulse Gen Serial Number: 8007164

## 2020-09-04 NOTE — Progress Notes (Signed)
Remote pacemaker transmission.   

## 2020-10-30 DIAGNOSIS — E669 Obesity, unspecified: Secondary | ICD-10-CM | POA: Diagnosis not present

## 2020-10-30 DIAGNOSIS — I10 Essential (primary) hypertension: Secondary | ICD-10-CM | POA: Diagnosis not present

## 2020-10-30 DIAGNOSIS — G4730 Sleep apnea, unspecified: Secondary | ICD-10-CM | POA: Diagnosis not present

## 2020-10-30 DIAGNOSIS — Z6833 Body mass index (BMI) 33.0-33.9, adult: Secondary | ICD-10-CM | POA: Diagnosis not present

## 2020-11-04 DIAGNOSIS — Z6833 Body mass index (BMI) 33.0-33.9, adult: Secondary | ICD-10-CM | POA: Diagnosis not present

## 2020-11-04 DIAGNOSIS — Z713 Dietary counseling and surveillance: Secondary | ICD-10-CM | POA: Diagnosis not present

## 2020-11-04 DIAGNOSIS — E669 Obesity, unspecified: Secondary | ICD-10-CM | POA: Diagnosis not present

## 2020-11-22 DIAGNOSIS — Z6833 Body mass index (BMI) 33.0-33.9, adult: Secondary | ICD-10-CM | POA: Diagnosis not present

## 2020-11-22 DIAGNOSIS — E669 Obesity, unspecified: Secondary | ICD-10-CM | POA: Diagnosis not present

## 2020-11-22 DIAGNOSIS — Z713 Dietary counseling and surveillance: Secondary | ICD-10-CM | POA: Diagnosis not present

## 2020-12-04 ENCOUNTER — Ambulatory Visit (INDEPENDENT_AMBULATORY_CARE_PROVIDER_SITE_OTHER): Payer: BC Managed Care – PPO

## 2020-12-04 DIAGNOSIS — I5022 Chronic systolic (congestive) heart failure: Secondary | ICD-10-CM

## 2020-12-05 ENCOUNTER — Other Ambulatory Visit: Payer: Self-pay | Admitting: Internal Medicine

## 2020-12-08 LAB — CUP PACEART REMOTE DEVICE CHECK
Battery Remaining Longevity: 73 mo
Battery Remaining Percentage: 95.5 %
Battery Voltage: 2.96 V
Brady Statistic AP VP Percent: 1 %
Brady Statistic AP VS Percent: 1 %
Brady Statistic AS VP Percent: 99 %
Brady Statistic AS VS Percent: 1 %
Brady Statistic RA Percent Paced: 1 %
Date Time Interrogation Session: 20211222020340
Implantable Lead Implant Date: 20110113
Implantable Lead Implant Date: 20110113
Implantable Lead Implant Date: 20110113
Implantable Lead Location: 753858
Implantable Lead Location: 753859
Implantable Lead Location: 753860
Implantable Pulse Generator Implant Date: 20180719
Lead Channel Impedance Value: 210 Ohm
Lead Channel Impedance Value: 440 Ohm
Lead Channel Impedance Value: 540 Ohm
Lead Channel Pacing Threshold Amplitude: 0.75 V
Lead Channel Pacing Threshold Amplitude: 1 V
Lead Channel Pacing Threshold Amplitude: 1.5 V
Lead Channel Pacing Threshold Pulse Width: 0.4 ms
Lead Channel Pacing Threshold Pulse Width: 0.5 ms
Lead Channel Pacing Threshold Pulse Width: 0.5 ms
Lead Channel Sensing Intrinsic Amplitude: 12 mV
Lead Channel Sensing Intrinsic Amplitude: 2.7 mV
Lead Channel Setting Pacing Amplitude: 1.75 V
Lead Channel Setting Pacing Amplitude: 2 V
Lead Channel Setting Pacing Amplitude: 2.5 V
Lead Channel Setting Pacing Pulse Width: 0.5 ms
Lead Channel Setting Pacing Pulse Width: 0.6 ms
Lead Channel Setting Sensing Sensitivity: 2 mV
Pulse Gen Model: 3222
Pulse Gen Serial Number: 8007164

## 2020-12-15 DIAGNOSIS — Z20822 Contact with and (suspected) exposure to covid-19: Secondary | ICD-10-CM | POA: Diagnosis not present

## 2020-12-18 NOTE — Progress Notes (Signed)
Remote pacemaker transmission.   

## 2021-02-07 DIAGNOSIS — I10 Essential (primary) hypertension: Secondary | ICD-10-CM | POA: Diagnosis not present

## 2021-02-07 DIAGNOSIS — H5712 Ocular pain, left eye: Secondary | ICD-10-CM | POA: Diagnosis not present

## 2021-04-16 ENCOUNTER — Emergency Department
Admission: EM | Admit: 2021-04-16 | Discharge: 2021-04-16 | Disposition: A | Discharge status: Home / Self Care | Payer: BC Managed Care – PPO | Source: Home / Self Care

## 2021-04-16 ENCOUNTER — Encounter: Payer: Self-pay | Admitting: *Deleted

## 2021-04-16 ENCOUNTER — Ambulatory Visit: Payer: Self-pay

## 2021-04-16 ENCOUNTER — Other Ambulatory Visit: Payer: Self-pay

## 2021-04-16 DIAGNOSIS — M79602 Pain in left arm: Secondary | ICD-10-CM | POA: Diagnosis not present

## 2021-04-16 DIAGNOSIS — M779 Enthesopathy, unspecified: Secondary | ICD-10-CM

## 2021-04-16 MED ORDER — MELOXICAM 15 MG PO TABS: 15.0000 mg | ORAL_TABLET | Freq: Every day | ORAL | 0 refills | Status: DC

## 2021-04-16 NOTE — ED Provider Notes (Signed)
[facility] CARE CENTER   [id number] 04/16/21 Arrival Time: 4580  DX:IPJAS PAIN  SUBJECTIVE: History from: patient. Alexis Clay is a 55 y.o. female complains of left arm pain that began a couple of months ago.  States that it is gotten worse over the last few weeks and really got worse last night.  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  Denies a precipitating event or specific injury.  Localizes the pain to the anterior aspect of left humerus and elbow.  Describes the pain as intermittent and dull in character, with intermittent sharp pains.  Has tried OTC 200 mg ibuprofen without relief.  Symptoms are made worse with activity.  Denies similar symptoms in the past.  Denies fever, chills, erythema, ecchymosis, effusion, weakness, numbness and tingling, saddle paresthesias, loss of bowel or bladder function.      ROS: As per HPI.  All other pertinent ROS negative.     Past Medical History:  Diagnosis Date  . Anxiety disorder   . Biventricular cardiac pacemaker in situ   . Bradycardia   . Cardiomyopathy (HCC)   . CHF (congestive heart failure) (HCC)   . Dilated cardiomyopathy (HCC)   . Headache   . Hypertension, benign   . LBBB (left bundle branch block)   . Panic disorder without agoraphobia   . Presence of permanent cardiac pacemaker   . Sinoatrial node dysfunction (HCC)   . Sinus node dysfunction (HCC)   . Supraventricular premature beats   . SVT (supraventricular tachycardia) (HCC)   . Systolic heart failure (HCC)    Due to hypertension (LVEF 35-55% after med therapy & resynchronizatoin)   Past Surgical History:  Procedure Laterality Date  . BIV PACEMAKER GENERATOR CHANGEOUT N/A 07/01/2017   Procedure: BiV Pacemaker Generator Changeout;  Surgeon: Marinus Maw, MD;  Location: [facility] INVASIVE CV LAB;  Service: Cardiovascular;  Laterality: N/A;  . CARDIAC CATHETERIZATION  2011   With widely patent coronary arteries   . CESAREAN SECTION  1991  .  COLONOSCOPY WITH PROPOFOL N/A 02/04/2016   Procedure: COLONOSCOPY WITH PROPOFOL;  Surgeon: Charna Elizabeth, MD;  Location: [facility] ENDOSCOPY;  Service: Endoscopy;  Laterality: N/A;  . LEEP  10/2010  . PACEMAKER INSERTION  12/26/2009   St. Jude BiV PM.  Model # [id number].  Serial # [id number]    . TUBAL LIGATION     No Known Allergies No current facility-administered medications on file prior to encounter.   Current Outpatient Medications on File Prior to Encounter  Medication Sig Dispense Refill  . aspirin EC 81 MG tablet Take 81 mg by mouth daily.    . carvedilol (COREG) 25 MG tablet TAKE 1 TABLET BY MOUTH TWICE A DAY 180 tablet 3  . lisinopril-hydrochlorothiazide (ZESTORETIC) 20-25 MG tablet TAKE 1 TABLET BY MOUTH EVERY DAY 90 tablet 1   Social History   Socioeconomic History  . Marital status: Married    Spouse name: Not on file  . Number of children: Not on file  . Years of education: Not on file  . Highest education level: Not on file  Occupational History  . Not on file  Tobacco Use  . Smoking status: Never Smoker  . Smokeless tobacco: Never Used  Vaping Use  . Vaping Use: Never used  Substance and Sexual Activity  . Alcohol use: Yes    Comment: occasionally  . Drug use: No  . Sexual activity: Not on file  Other Topics Concern  . Not on  file  Social History Narrative  . Not on file   Social Determinants of Health   Financial Resource Strain: Not on file  Food Insecurity: Not on file  Transportation Needs: Not on file  Physical Activity: Not on file  Stress: Not on file  Social Connections: Not on file  Intimate Partner Violence: Not on file   Family History  Problem Relation Age of Onset  . Cancer Mother        cervical  . Heart disease Mother        CABGx3 in 2014  . CAD Father   . Congestive Heart Failure Father   . Hypertension Father   . Renal Disease Father        CKD, ESRD  . Hypertension Brother   . Hypertension Brother   . Hypertension Sister      OBJECTIVE:  Vitals:   04/16/21 1005 04/16/21 1008  BP:  110/76  Pulse:  68  Resp:  16  Temp:  98.3 F (36.8 C)  TempSrc:  Oral  SpO2:  99%  Weight: 210 lb (95.3 kg)   Height: 5\' 6"  (1.676 m)     General appearance: ALERT; in no acute distress.  Head: NCAT Lungs: Normal respiratory effort CV: pulses 2+ bilaterally. Cap refill < 2 seconds Musculoskeletal:  Inspection: Skin warm, dry, clear and intact No erythema, effusion noted Palpation: Anterior aspect of the left upper arm mildly tender to palpation ROM: FROM active and passive Skin: warm and dry Neurologic: Ambulates without difficulty; Sensation intact about the upper/ lower extremities Psychological: alert and cooperative; normal mood and affect  DIAGNOSTIC STUDIES:  No results found.   ASSESSMENT & PLAN:  1. Left arm pain   2. Tendonitis     Meds ordered this encounter  Medications  . meloxicam (MOBIC) 15 MG tablet    Sig: Take 1 tablet (15 mg total) by mouth daily.    Dispense:  30 tablet    Refill:  0    Order Specific Question:   Supervising Provider    Answer:   Merrilee Jansky    Continue conservative management of rest, ice, and gentle stretches Take meloxicam once a day as needed for pain and inflammation Do not take ibuprofen with this medication You may take Tylenol with this medication Elbow sleeve given in office today Wear with activity and for comfort Follow up with sports medicine if symptoms persist Return or go to the ER if you have any new or worsening symptoms (fever, chills, chest pain, abdominal pain, changes in bowel or bladder habits, pain radiating into lower legs)   Reviewed expectations re: course of current medical issues. Questions answered. Outlined signs and symptoms indicating need for more acute intervention. Patient verbalized understanding. After Visit Summary given.       [[id number]], NP 04/16/21 1109

## 2021-04-16 NOTE — ED Triage Notes (Signed)
Pt c/o dull LT arm pain x , worse x 3wks. Denies injury. She does type a lot for work.

## 2021-04-16 NOTE — Discharge Instructions (Signed)
I have sent in Mobic for you to take once daily for pain and inflammation. Do not take ibuprofen with this medication. May take tylenol.  Wear the brace when active and when sleeping.   Follow up with sports medicine if symptoms are persisting.

## 2021-04-17 ENCOUNTER — Ambulatory Visit: Payer: BC Managed Care – PPO | Admitting: Internal Medicine

## 2021-04-17 ENCOUNTER — Encounter: Payer: Self-pay | Admitting: Internal Medicine

## 2021-04-17 VITALS — BP 112/76 | HR 69 | Ht 66.0 in | Wt 214.2 lb

## 2021-04-17 DIAGNOSIS — I495 Sick sinus syndrome: Secondary | ICD-10-CM

## 2021-04-17 DIAGNOSIS — Z950 Presence of cardiac pacemaker: Secondary | ICD-10-CM | POA: Diagnosis not present

## 2021-04-17 DIAGNOSIS — I5022 Chronic systolic (congestive) heart failure: Secondary | ICD-10-CM

## 2021-04-17 NOTE — Patient Instructions (Signed)
Medication Instructions:  Your physician recommends that you continue on your current medications as directed. Please refer to the Current Medication list given to you today.  Labwork: None ordered.  Testing/Procedures: None ordered.  Follow-Up: Your physician wants you to follow-up in: one year with Lewayne Bunting, MD or one of the following Advanced Practice Providers on your designated Care Team:    Gypsy Balsam, NP  Francis Dowse, PA-C  Casimiro Needle "Mardelle Matte" [place], [place]  Remote monitoring is used to monitor your Pacemaker from home. This monitoring reduces the number of office visits required to check your device to one time per year. It allows [place] to keep an eye on the functioning of your device to ensure it is working properly. You are scheduled for a device check from home on 06/04/2021. You may send your transmission at any time that day. If you have a wireless device, the transmission will be sent automatically. After your physician reviews your transmission, you will receive a postcard with your next transmission date.  Any Other Special Instructions Will Be Listed Below (If Applicable).  If you need a refill on your cardiac medications before your next appointment, please call your pharmacy.

## 2021-04-17 NOTE — Progress Notes (Signed)
HPI Mrs. Henigan returns today for followup. She is a pleasant 55 yo woman with a h/o HTN, obesity and chronic systolic heart failure, s/p biv PPM insertion. In the interim she has been stable. She denies chest pain and her CHF is class 2A. She denies edema. Her Biv PPM has been in place almost 4 years. Prior she had a dual chamber PPM.  She denies syncope. No Known Allergies   Current Outpatient Medications  Medication Sig Dispense Refill  . aspirin EC 81 MG tablet Take 81 mg by mouth daily.    . carvedilol (COREG) 25 MG tablet TAKE 1 TABLET BY MOUTH TWICE A DAY 180 tablet 3  . lisinopril-hydrochlorothiazide (ZESTORETIC) 20-25 MG tablet TAKE 1 TABLET BY MOUTH EVERY DAY 90 tablet 1  . meloxicam (MOBIC) 15 MG tablet Take 1 tablet (15 mg total) by mouth daily. 30 tablet 0   No current facility-administered medications for this visit.     Past Medical History:  Diagnosis Date  . Anxiety disorder   . Biventricular cardiac pacemaker in situ   . Bradycardia   . Cardiomyopathy (HCC)   . CHF (congestive heart failure) (HCC)   . Dilated cardiomyopathy (HCC)   . Headache   . Hypertension, benign   . LBBB (left bundle branch block)   . Panic disorder without agoraphobia   . Presence of permanent cardiac pacemaker   . Sinoatrial node dysfunction (HCC)   . Sinus node dysfunction (HCC)   . Supraventricular premature beats   . SVT (supraventricular tachycardia) (HCC)   . Systolic heart failure (HCC)    Due to hypertension (LVEF 35-55% after med therapy & resynchronizatoin)    ROS:   All systems reviewed and negative except as noted in the HPI.   Past Surgical History:  Procedure Laterality Date  . BIV PACEMAKER GENERATOR CHANGEOUT N/A 07/01/2017   Procedure: BiV Pacemaker Generator Changeout;  Surgeon: Marinus Maw, MD;  Location: [facility] INVASIVE CV LAB;  Service: Cardiovascular;  Laterality: N/A;  . CARDIAC CATHETERIZATION  2011   With widely patent coronary arteries   .  CESAREAN SECTION  1991  . COLONOSCOPY WITH PROPOFOL N/A 02/04/2016   Procedure: COLONOSCOPY WITH PROPOFOL;  Surgeon: Charna Elizabeth, MD;  Location: [facility] ENDOSCOPY;  Service: Endoscopy;  Laterality: N/A;  . LEEP  10/2010  . PACEMAKER INSERTION  12/26/2009   St. Jude BiV PM.  Model # [id number].  Serial # [id number]    . TUBAL LIGATION       Family History  Problem Relation Age of Onset  . Cancer Mother        cervical  . Heart disease Mother        CABGx3 in 2014  . CAD Father   . Congestive Heart Failure Father   . Hypertension Father   . Renal Disease Father        CKD, ESRD  . Hypertension Brother   . Hypertension Brother   . Hypertension Sister      Social History   Socioeconomic History  . Marital status: Married    Spouse name: Not on file  . Number of children: Not on file  . Years of education: Not on file  . Highest education level: Not on file  Occupational History  . Not on file  Tobacco Use  . Smoking status: Never Smoker  . Smokeless tobacco: Never Used  Vaping Use  . Vaping Use: Never used  Substance and Sexual Activity  .  Alcohol use: Yes    Comment: occasionally  . Drug use: No  . Sexual activity: Not on file  Other Topics Concern  . Not on file  Social History Narrative  . Not on file   Social Determinants of Health   Financial Resource Strain: Not on file  Food Insecurity: Not on file  Transportation Needs: Not on file  Physical Activity: Not on file  Stress: Not on file  Social Connections: Not on file  Intimate Partner Violence: Not on file     There were no vitals taken for this visit.  Physical Exam:  Well appearing NAD HEENT: Unremarkable Neck:  No JVD, no thyromegally Lymphatics:  No adenopathy Back:  No CVA tenderness Lungs:  Clear HEART:  Regular rate rhythm, no murmurs, no rubs, no clicks Abd:  soft, positive bowel sounds, no organomegally, no rebound, no guarding Ext:  2 plus pulses, no edema, no cyanosis, no clubbing Skin:  No  rashes no nodules Neuro:  CN II through XII intact, motor grossly intact  EKG - nsr with biv pacing  DEVICE  Normal device function.  See PaceArt for details.   Assess/Plan: 1. Chronic systolic heart failure - her symptoms are really class 1 at this point. She will continue her current meds. 2. PPM - her St. Jude Biv PPM is working normally.  3. Arm pain - she is taking meds for MS pain.   Sharlot Gowda Brieonna Crutcher,MD

## 2021-05-24 ENCOUNTER — Other Ambulatory Visit: Payer: Self-pay | Admitting: Internal Medicine

## 2021-06-24 ENCOUNTER — Ambulatory Visit (INDEPENDENT_AMBULATORY_CARE_PROVIDER_SITE_OTHER): Payer: BC Managed Care – PPO

## 2021-06-24 DIAGNOSIS — I5022 Chronic systolic (congestive) heart failure: Secondary | ICD-10-CM

## 2021-06-24 LAB — CUP PACEART REMOTE DEVICE CHECK
Battery Remaining Longevity: 33 mo
Battery Remaining Percentage: 42 %
Battery Voltage: 2.96 V
Brady Statistic AP VP Percent: 1.7 %
Brady Statistic AP VS Percent: 1 %
Brady Statistic AS VP Percent: 98 %
Brady Statistic AS VS Percent: 1 %
Brady Statistic RA Percent Paced: 1.6 %
Date Time Interrogation Session: 20220711143127
Implantable Lead Implant Date: 20110113
Implantable Lead Implant Date: 20110113
Implantable Lead Implant Date: 20110113
Implantable Lead Location: 753858
Implantable Lead Location: 753859
Implantable Lead Location: 753860
Implantable Pulse Generator Implant Date: 20180719
Lead Channel Impedance Value: 230 Ohm
Lead Channel Impedance Value: 450 Ohm
Lead Channel Impedance Value: 540 Ohm
Lead Channel Pacing Threshold Amplitude: 0.625 V
Lead Channel Pacing Threshold Amplitude: 1 V
Lead Channel Pacing Threshold Amplitude: 1.25 V
Lead Channel Pacing Threshold Pulse Width: 0.4 ms
Lead Channel Pacing Threshold Pulse Width: 0.5 ms
Lead Channel Pacing Threshold Pulse Width: 0.6 ms
Lead Channel Sensing Intrinsic Amplitude: 0.9 mV
Lead Channel Sensing Intrinsic Amplitude: 12 mV
Lead Channel Setting Pacing Amplitude: 1.625
Lead Channel Setting Pacing Amplitude: 2 V
Lead Channel Setting Pacing Amplitude: 2.5 V
Lead Channel Setting Pacing Pulse Width: 0.5 ms
Lead Channel Setting Pacing Pulse Width: 0.6 ms
Lead Channel Setting Sensing Sensitivity: 2 mV
Pulse Gen Model: 3222
Pulse Gen Serial Number: 8007164

## 2021-07-17 NOTE — Progress Notes (Signed)
Remote pacemaker transmission.   

## 2021-09-07 IMAGING — CR DG CHEST 2V
2 series · 2 of 2 positions shown · non-contrast
Comparison: Chest radiograph dated 08/22/2019

CLINICAL DATA: Palpitations

EXAM:
CHEST - 2 VIEW

[chest pa]
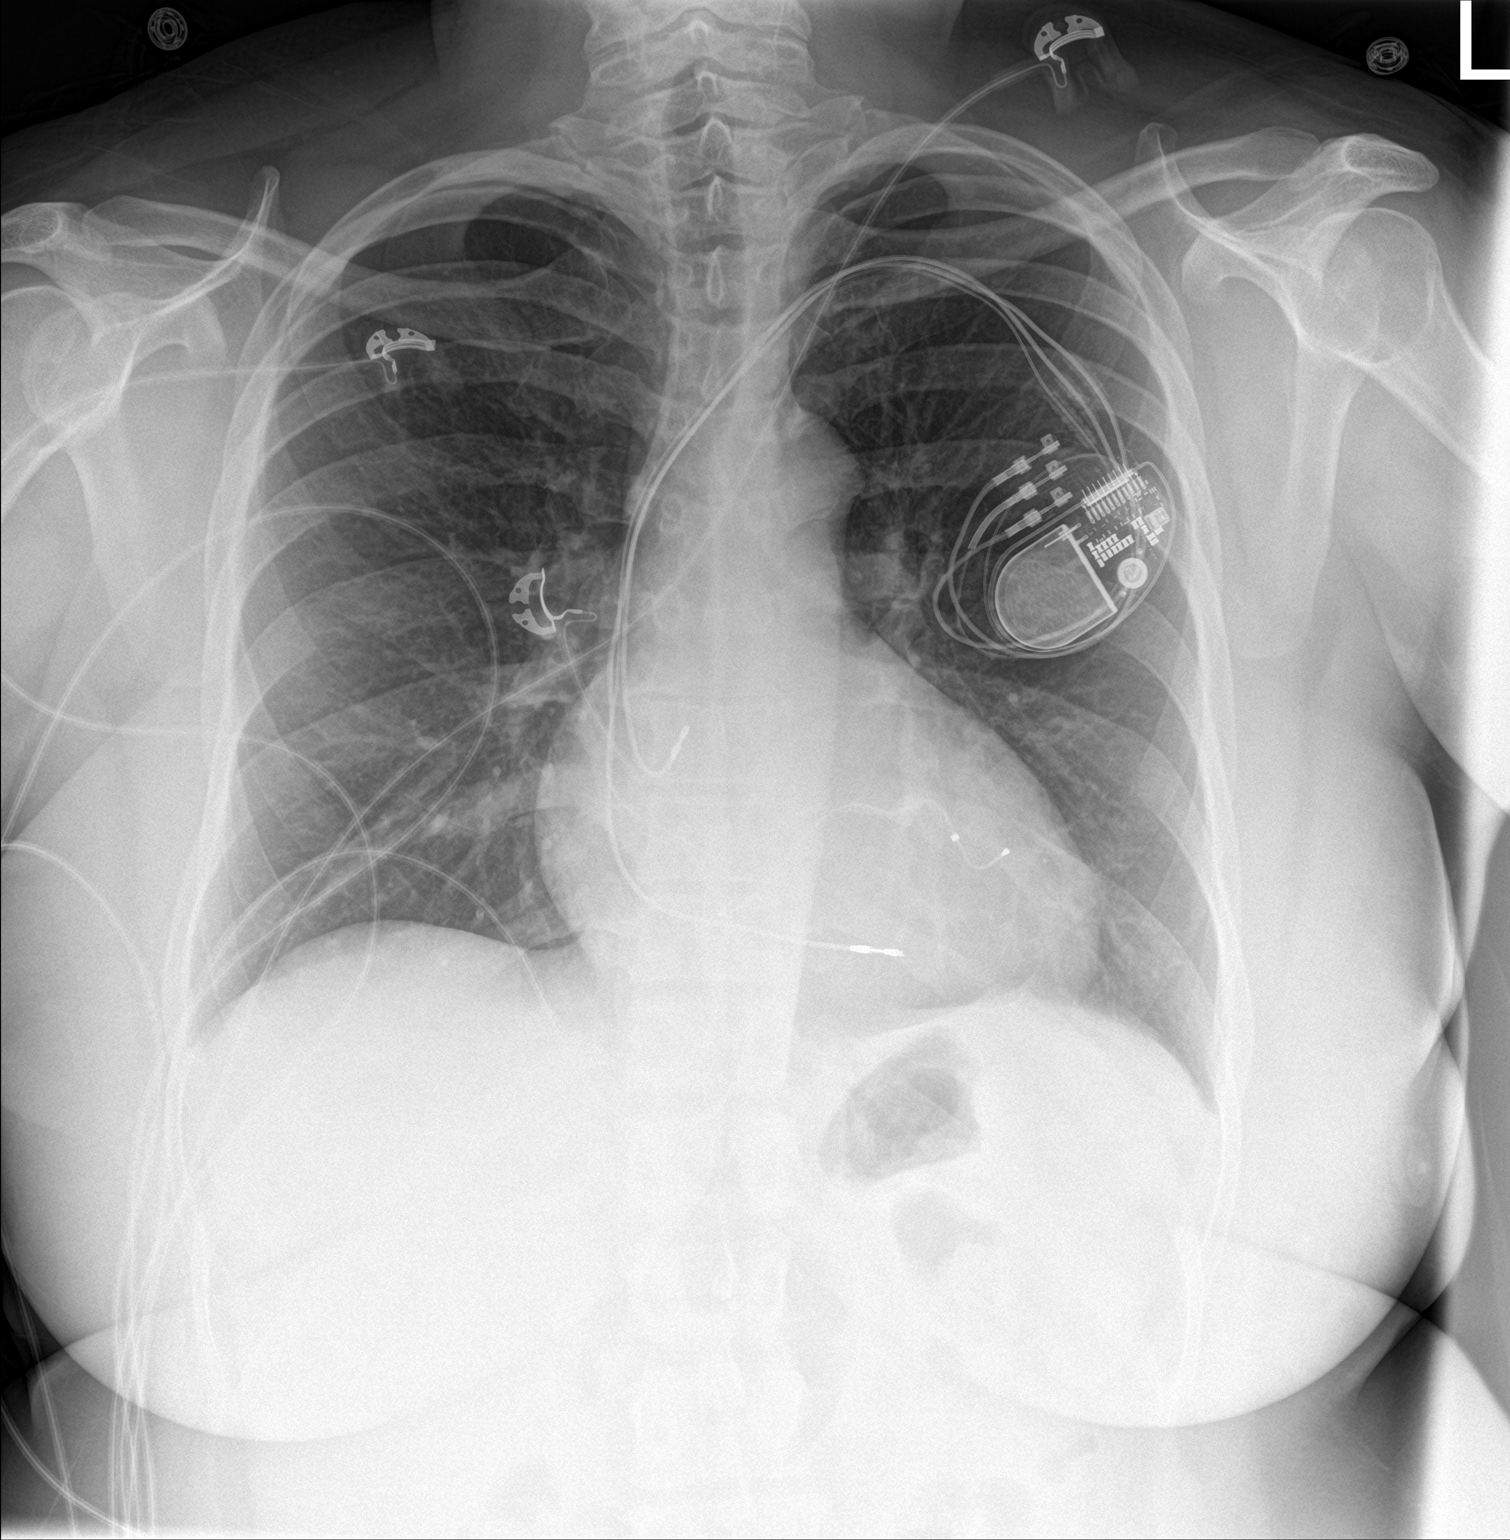

[chest lat]
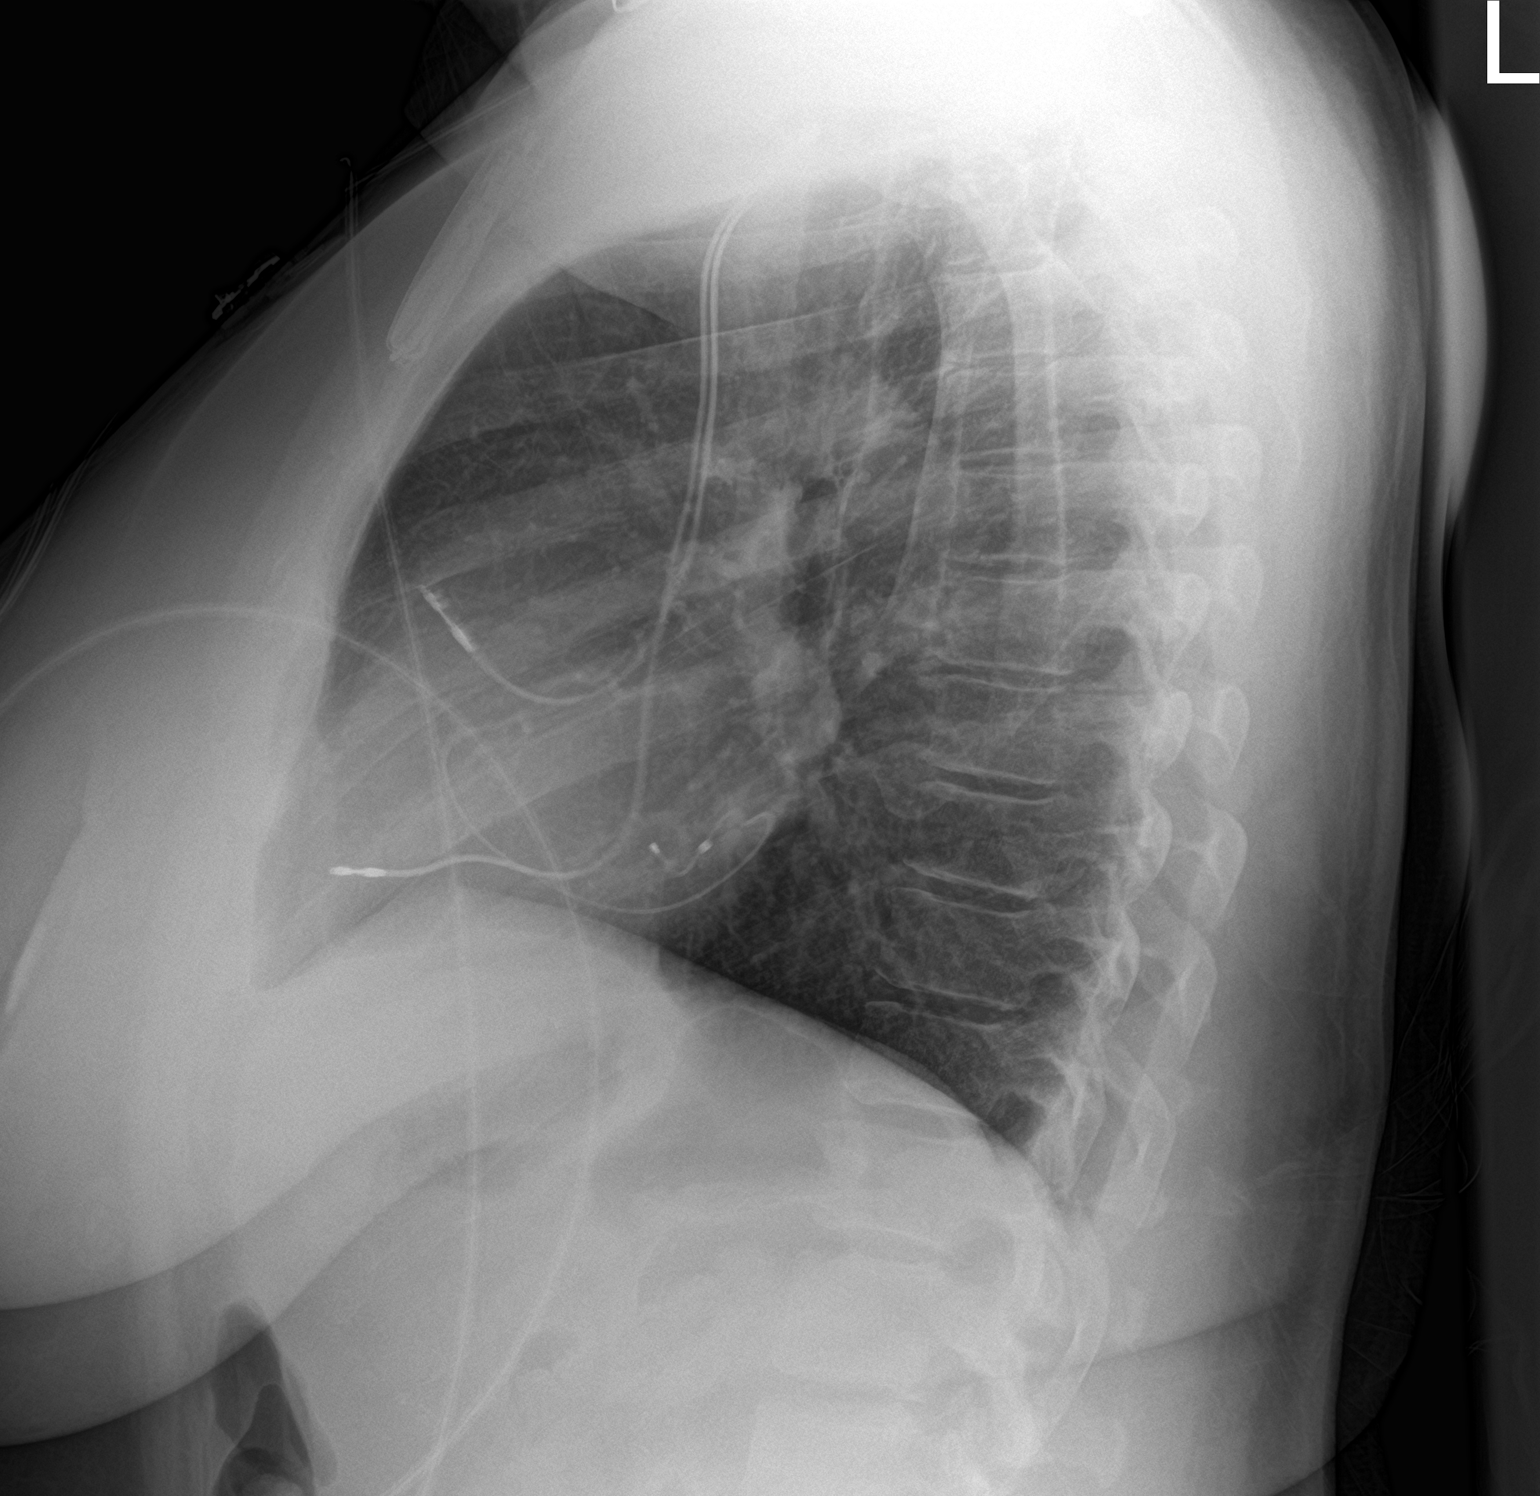

[2 of 2 positions shown; findings below may reference images not displayed]

FINDINGS: A left subclavian approach cardiac device is redemonstrated. The
heart size and mediastinal contours are within normal limits. Both
lungs are clear. The visualized skeletal structures are
unremarkable.
IMPRESSION: No active cardiopulmonary disease.

## 2021-09-17 ENCOUNTER — Other Ambulatory Visit: Payer: Self-pay | Admitting: Internal Medicine

## 2021-09-23 ENCOUNTER — Ambulatory Visit (INDEPENDENT_AMBULATORY_CARE_PROVIDER_SITE_OTHER): Payer: BC Managed Care – PPO

## 2021-09-23 DIAGNOSIS — I5022 Chronic systolic (congestive) heart failure: Secondary | ICD-10-CM | POA: Diagnosis not present

## 2021-09-25 LAB — CUP PACEART REMOTE DEVICE CHECK
Battery Remaining Longevity: 30 mo
Battery Remaining Percentage: 38 %
Battery Voltage: 2.95 V
Brady Statistic AP VP Percent: 2.8 %
Brady Statistic AP VS Percent: 1 %
Brady Statistic AS VP Percent: 97 %
Brady Statistic AS VS Percent: 1 %
Brady Statistic RA Percent Paced: 2.7 %
Date Time Interrogation Session: 20221012103456
Implantable Lead Implant Date: 20110113
Implantable Lead Implant Date: 20110113
Implantable Lead Implant Date: 20110113
Implantable Lead Location: 753858
Implantable Lead Location: 753859
Implantable Lead Location: 753860
Implantable Pulse Generator Implant Date: 20180719
Lead Channel Impedance Value: 210 Ohm
Lead Channel Impedance Value: 410 Ohm
Lead Channel Impedance Value: 490 Ohm
Lead Channel Pacing Threshold Amplitude: 0.625 V
Lead Channel Pacing Threshold Amplitude: 1 V
Lead Channel Pacing Threshold Amplitude: 1.25 V
Lead Channel Pacing Threshold Pulse Width: 0.4 ms
Lead Channel Pacing Threshold Pulse Width: 0.5 ms
Lead Channel Pacing Threshold Pulse Width: 0.6 ms
Lead Channel Sensing Intrinsic Amplitude: 1.5 mV
Lead Channel Sensing Intrinsic Amplitude: 12 mV
Lead Channel Setting Pacing Amplitude: 1.625
Lead Channel Setting Pacing Amplitude: 2 V
Lead Channel Setting Pacing Amplitude: 2.5 V
Lead Channel Setting Pacing Pulse Width: 0.5 ms
Lead Channel Setting Pacing Pulse Width: 0.6 ms
Lead Channel Setting Sensing Sensitivity: 2 mV
Pulse Gen Model: 3222
Pulse Gen Serial Number: 8007164

## 2021-10-02 NOTE — Progress Notes (Signed)
Remote pacemaker transmission.   

## 2021-11-10 DIAGNOSIS — Z01419 Encounter for gynecological examination (general) (routine) without abnormal findings: Secondary | ICD-10-CM | POA: Diagnosis not present

## 2021-11-10 DIAGNOSIS — Z1231 Encounter for screening mammogram for malignant neoplasm of breast: Secondary | ICD-10-CM | POA: Diagnosis not present

## 2021-11-10 DIAGNOSIS — Z6833 Body mass index (BMI) 33.0-33.9, adult: Secondary | ICD-10-CM | POA: Diagnosis not present

## 2021-12-23 ENCOUNTER — Ambulatory Visit (INDEPENDENT_AMBULATORY_CARE_PROVIDER_SITE_OTHER): Payer: BC Managed Care – PPO

## 2021-12-23 DIAGNOSIS — I5022 Chronic systolic (congestive) heart failure: Secondary | ICD-10-CM | POA: Diagnosis not present

## 2021-12-25 LAB — CUP PACEART REMOTE DEVICE CHECK
Battery Remaining Longevity: 28 mo
Battery Remaining Percentage: 34 %
Battery Voltage: 2.95 V
Brady Statistic AP VP Percent: 2.1 %
Brady Statistic AP VS Percent: 1 %
Brady Statistic AS VP Percent: 98 %
Brady Statistic AS VS Percent: 1 %
Brady Statistic RA Percent Paced: 2 %
Date Time Interrogation Session: 20230112084950
Implantable Lead Implant Date: 20110113
Implantable Lead Implant Date: 20110113
Implantable Lead Implant Date: 20110113
Implantable Lead Location: 753858
Implantable Lead Location: 753859
Implantable Lead Location: 753860
Implantable Pulse Generator Implant Date: 20180719
Lead Channel Impedance Value: 230 Ohm
Lead Channel Impedance Value: 430 Ohm
Lead Channel Impedance Value: 580 Ohm
Lead Channel Pacing Threshold Amplitude: 0.75 V
Lead Channel Pacing Threshold Amplitude: 1 V
Lead Channel Pacing Threshold Amplitude: 1.25 V
Lead Channel Pacing Threshold Pulse Width: 0.4 ms
Lead Channel Pacing Threshold Pulse Width: 0.5 ms
Lead Channel Pacing Threshold Pulse Width: 0.6 ms
Lead Channel Sensing Intrinsic Amplitude: 1 mV
Lead Channel Sensing Intrinsic Amplitude: 12 mV
Lead Channel Setting Pacing Amplitude: 1.75 V
Lead Channel Setting Pacing Amplitude: 2 V
Lead Channel Setting Pacing Amplitude: 2.5 V
Lead Channel Setting Pacing Pulse Width: 0.5 ms
Lead Channel Setting Pacing Pulse Width: 0.6 ms
Lead Channel Setting Sensing Sensitivity: 2 mV
Pulse Gen Model: 3222
Pulse Gen Serial Number: 8007164

## 2022-01-02 NOTE — Progress Notes (Signed)
Remote pacemaker transmission.   

## 2022-03-19 IMAGING — CR DG CHEST 2V
2 series · 2 of 2 positions shown · non-contrast
Comparison: 11/19/2019

CLINICAL DATA: Chest and left shoulder pain for 3 days

EXAM:
CHEST - 2 VIEW

[chest pa]
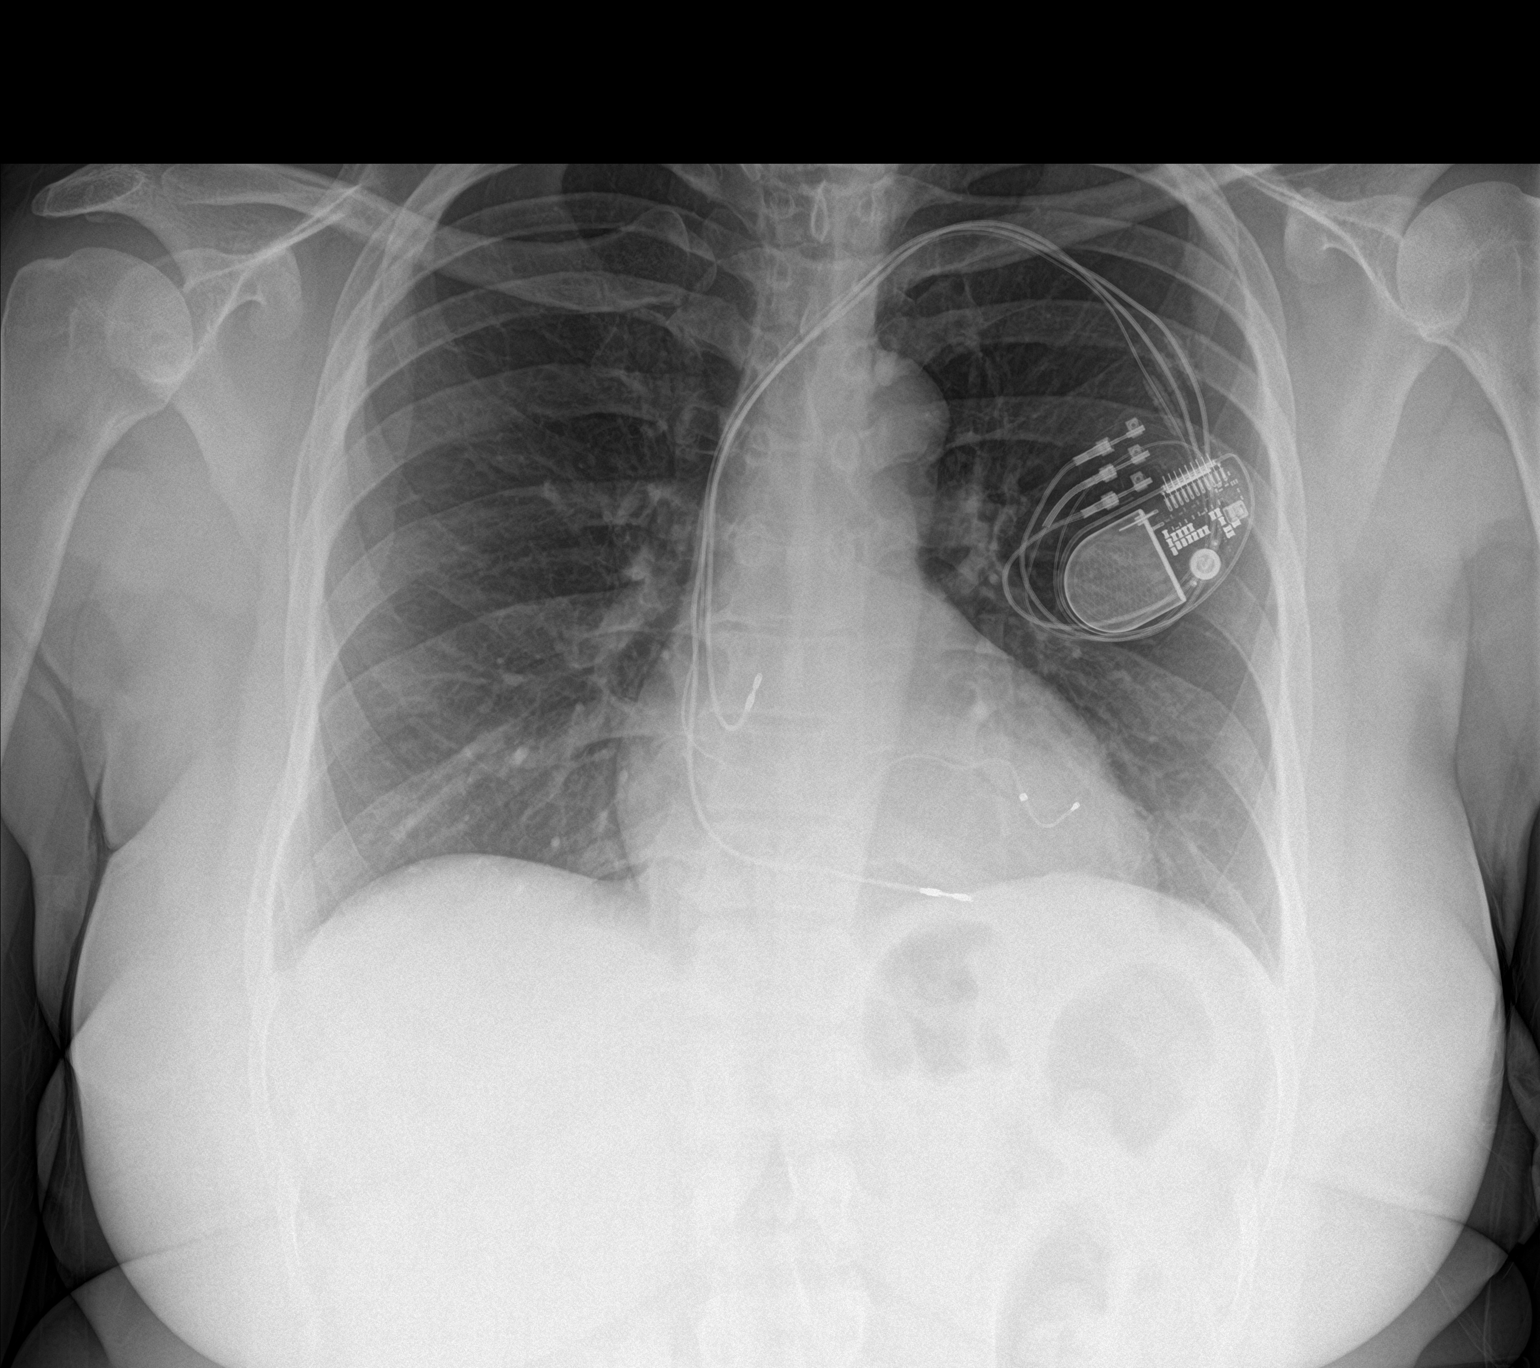

[chest lat]
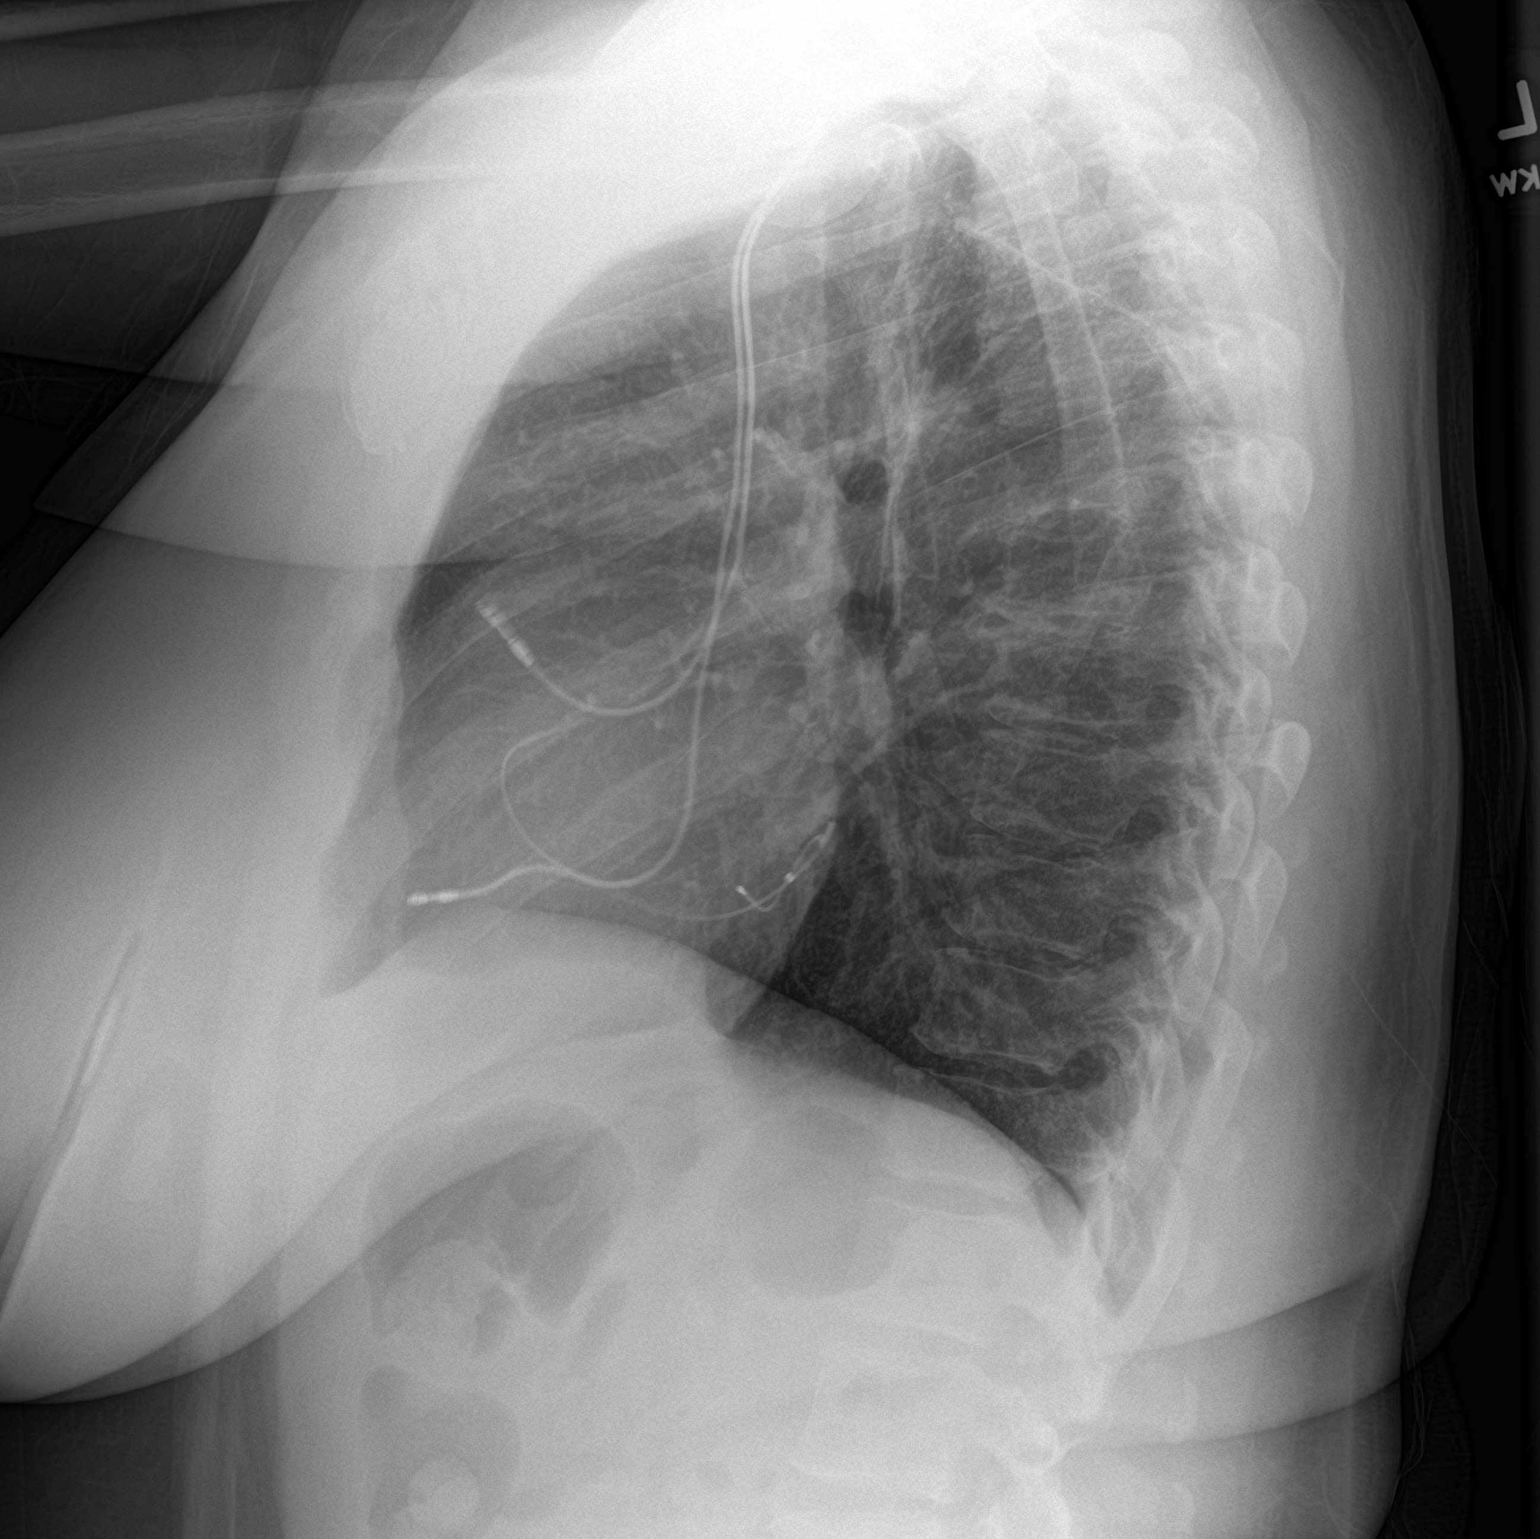

[2 of 2 positions shown; findings below may reference images not displayed]

FINDINGS: Frontal and lateral views of the chest demonstrates stable multi
lead pacer. Cardiac silhouette is unremarkable. No airspace disease,
effusion, or pneumothorax. No acute bony abnormality.
IMPRESSION: 1. No acute intrathoracic process.

## 2022-03-24 ENCOUNTER — Ambulatory Visit (INDEPENDENT_AMBULATORY_CARE_PROVIDER_SITE_OTHER): Payer: BC Managed Care – PPO

## 2022-03-24 DIAGNOSIS — I5022 Chronic systolic (congestive) heart failure: Secondary | ICD-10-CM | POA: Diagnosis not present

## 2022-03-25 LAB — CUP PACEART REMOTE DEVICE CHECK
Battery Remaining Longevity: 25 mo
Battery Remaining Percentage: 31 %
Battery Voltage: 2.93 V
Brady Statistic AP VP Percent: 1.8 %
Brady Statistic AP VS Percent: 1 %
Brady Statistic AS VP Percent: 98 %
Brady Statistic AS VS Percent: 1 %
Brady Statistic RA Percent Paced: 1.7 %
Date Time Interrogation Session: 20230412084133
Implantable Lead Implant Date: 20110113
Implantable Lead Implant Date: 20110113
Implantable Lead Implant Date: 20110113
Implantable Lead Location: 753858
Implantable Lead Location: 753859
Implantable Lead Location: 753860
Implantable Pulse Generator Implant Date: 20180719
Lead Channel Impedance Value: 230 Ohm
Lead Channel Impedance Value: 430 Ohm
Lead Channel Impedance Value: 580 Ohm
Lead Channel Pacing Threshold Amplitude: 0.625 V
Lead Channel Pacing Threshold Amplitude: 1 V
Lead Channel Pacing Threshold Amplitude: 1.25 V
Lead Channel Pacing Threshold Pulse Width: 0.4 ms
Lead Channel Pacing Threshold Pulse Width: 0.5 ms
Lead Channel Pacing Threshold Pulse Width: 0.6 ms
Lead Channel Sensing Intrinsic Amplitude: 1 mV
Lead Channel Sensing Intrinsic Amplitude: 12 mV
Lead Channel Setting Pacing Amplitude: 1.625
Lead Channel Setting Pacing Amplitude: 2 V
Lead Channel Setting Pacing Amplitude: 2.5 V
Lead Channel Setting Pacing Pulse Width: 0.5 ms
Lead Channel Setting Pacing Pulse Width: 0.6 ms
Lead Channel Setting Sensing Sensitivity: 2 mV
Pulse Gen Model: 3222
Pulse Gen Serial Number: 8007164

## 2022-04-10 NOTE — Progress Notes (Signed)
Remote pacemaker transmission.   

## 2022-05-19 DIAGNOSIS — J029 Acute pharyngitis, unspecified: Secondary | ICD-10-CM | POA: Diagnosis not present

## 2022-05-19 DIAGNOSIS — J069 Acute upper respiratory infection, unspecified: Secondary | ICD-10-CM | POA: Diagnosis not present

## 2022-05-19 DIAGNOSIS — Z20822 Contact with and (suspected) exposure to covid-19: Secondary | ICD-10-CM | POA: Diagnosis not present

## 2022-06-23 ENCOUNTER — Ambulatory Visit (INDEPENDENT_AMBULATORY_CARE_PROVIDER_SITE_OTHER): Payer: BC Managed Care – PPO

## 2022-06-23 DIAGNOSIS — I495 Sick sinus syndrome: Secondary | ICD-10-CM | POA: Diagnosis not present

## 2022-06-23 LAB — CUP PACEART REMOTE DEVICE CHECK
Battery Remaining Longevity: 22 mo
Battery Remaining Percentage: 27 %
Battery Voltage: 2.93 V
Brady Statistic AP VP Percent: 1.7 %
Brady Statistic AP VS Percent: 1 %
Brady Statistic AS VP Percent: 98 %
Brady Statistic AS VS Percent: 1 %
Brady Statistic RA Percent Paced: 1.5 %
Date Time Interrogation Session: 20230711070928
Implantable Lead Implant Date: 20110113
Implantable Lead Implant Date: 20110113
Implantable Lead Implant Date: 20110113
Implantable Lead Location: 753858
Implantable Lead Location: 753859
Implantable Lead Location: 753860
Implantable Pulse Generator Implant Date: 20180719
Lead Channel Impedance Value: 230 Ohm
Lead Channel Impedance Value: 410 Ohm
Lead Channel Impedance Value: 630 Ohm
Lead Channel Pacing Threshold Amplitude: 0.75 V
Lead Channel Pacing Threshold Amplitude: 1 V
Lead Channel Pacing Threshold Amplitude: 1.25 V
Lead Channel Pacing Threshold Pulse Width: 0.4 ms
Lead Channel Pacing Threshold Pulse Width: 0.5 ms
Lead Channel Pacing Threshold Pulse Width: 0.6 ms
Lead Channel Sensing Intrinsic Amplitude: 12 mV
Lead Channel Sensing Intrinsic Amplitude: 2.9 mV
Lead Channel Setting Pacing Amplitude: 1.75 V
Lead Channel Setting Pacing Amplitude: 2 V
Lead Channel Setting Pacing Amplitude: 2.5 V
Lead Channel Setting Pacing Pulse Width: 0.5 ms
Lead Channel Setting Pacing Pulse Width: 0.6 ms
Lead Channel Setting Sensing Sensitivity: 2 mV
Pulse Gen Model: 3222
Pulse Gen Serial Number: 8007164

## 2022-07-15 ENCOUNTER — Other Ambulatory Visit: Payer: Self-pay | Admitting: Internal Medicine

## 2022-07-16 NOTE — Progress Notes (Signed)
Remote pacemaker transmission.   

## 2022-07-21 ENCOUNTER — Encounter: Payer: Self-pay | Admitting: Internal Medicine

## 2022-07-21 ENCOUNTER — Ambulatory Visit (INDEPENDENT_AMBULATORY_CARE_PROVIDER_SITE_OTHER): Payer: BC Managed Care – PPO | Admitting: Internal Medicine

## 2022-07-21 VITALS — BP 110/90 | HR 65 | Ht 66.0 in | Wt 213.0 lb

## 2022-07-21 DIAGNOSIS — I495 Sick sinus syndrome: Secondary | ICD-10-CM

## 2022-07-21 DIAGNOSIS — I5022 Chronic systolic (congestive) heart failure: Secondary | ICD-10-CM | POA: Diagnosis not present

## 2022-07-21 DIAGNOSIS — Z950 Presence of cardiac pacemaker: Secondary | ICD-10-CM

## 2022-07-21 LAB — CUP PACEART INCLINIC DEVICE CHECK
Battery Remaining Longevity: 18 mo
Battery Voltage: 2.92 V
Brady Statistic RA Percent Paced: 1.4 %
Brady Statistic RV Percent Paced: 99.66 %
Date Time Interrogation Session: 20230808170203
Implantable Lead Implant Date: 20110113
Implantable Lead Implant Date: 20110113
Implantable Lead Implant Date: 20110113
Implantable Lead Location: 753858
Implantable Lead Location: 753859
Implantable Lead Location: 753860
Implantable Pulse Generator Implant Date: 20180719
Lead Channel Impedance Value: 225 Ohm
Lead Channel Impedance Value: 462.5 Ohm
Lead Channel Impedance Value: 687.5 Ohm
Lead Channel Pacing Threshold Amplitude: 0.625 V
Lead Channel Pacing Threshold Amplitude: 1.25 V
Lead Channel Pacing Threshold Amplitude: 1.25 V
Lead Channel Pacing Threshold Amplitude: 1.5 V
Lead Channel Pacing Threshold Amplitude: 1.5 V
Lead Channel Pacing Threshold Pulse Width: 0.4 ms
Lead Channel Pacing Threshold Pulse Width: 0.5 ms
Lead Channel Pacing Threshold Pulse Width: 0.5 ms
Lead Channel Pacing Threshold Pulse Width: 0.6 ms
Lead Channel Pacing Threshold Pulse Width: 0.6 ms
Lead Channel Sensing Intrinsic Amplitude: 12 mV
Lead Channel Sensing Intrinsic Amplitude: 4.2 mV
Lead Channel Setting Pacing Amplitude: 1.625
Lead Channel Setting Pacing Amplitude: 2.5 V
Lead Channel Setting Pacing Amplitude: 2.5 V
Lead Channel Setting Pacing Pulse Width: 0.5 ms
Lead Channel Setting Pacing Pulse Width: 0.6 ms
Lead Channel Setting Sensing Sensitivity: 2 mV
Pulse Gen Model: 3222
Pulse Gen Serial Number: 8007164

## 2022-07-21 NOTE — Progress Notes (Signed)
HPI Alexis Clay returns today for followup. She is a pleasant 56 yo woman with a h/o HTN, obesity and chronic systolic heart failure, s/p biv PPM insertion. In the interim she has been stable. She denies chest pain and her CHF is class 2A. She denies edema. Her Biv PPM has been in place 5 years. She denies syncope.  No Known Allergies   Current Outpatient Medications  Medication Sig Dispense Refill   aspirin EC 81 MG tablet Take 81 mg by mouth daily.     carvedilol (COREG) 25 MG tablet TAKE 1 TABLET BY MOUTH TWICE A DAY 180 tablet 2   lisinopril-hydrochlorothiazide (ZESTORETIC) 20-25 MG tablet TAKE 1 TABLET BY MOUTH EVERY DAY 30 tablet 0   meloxicam (MOBIC) 15 MG tablet Take 1 tablet (15 mg total) by mouth daily. (Patient not taking: Reported on 07/21/2022) 30 tablet 0   No current facility-administered medications for this visit.     Past Medical History:  Diagnosis Date   Anxiety disorder    Biventricular cardiac pacemaker in situ    Bradycardia    Cardiomyopathy (HCC)    CHF (congestive heart failure) (HCC)    Dilated cardiomyopathy (HCC)    Headache    Hypertension, benign    LBBB (left bundle branch block)    Panic disorder without agoraphobia    Presence of permanent cardiac pacemaker    Sinoatrial node dysfunction (HCC)    Sinus node dysfunction (HCC)    Supraventricular premature beats    SVT (supraventricular tachycardia) (HCC)    Systolic heart failure (HCC)    Due to hypertension (LVEF 35-55% after med therapy & resynchronizatoin)    ROS:   All systems reviewed and negative except as noted in the HPI.   Past Surgical History:  Procedure Laterality Date   BIV PACEMAKER GENERATOR CHANGEOUT N/A 07/01/2017   Procedure: BiV Pacemaker Generator Changeout;  Surgeon: Marinus Maw, MD;  [facility]: [facility] INVASIVE CV LAB;  Service: Cardiovascular;  Laterality: N/A;   CARDIAC CATHETERIZATION  2011   With widely patent coronary arteries    CESAREAN SECTION   1991   COLONOSCOPY WITH PROPOFOL N/A 02/04/2016   Procedure: COLONOSCOPY WITH PROPOFOL;  Surgeon: Charna Elizabeth, MD;  Location: [facility] ENDOSCOPY;  Service: Endoscopy;  Laterality: N/A;   LEEP  10/2010   PACEMAKER INSERTION  12/26/2009   St. Jude BiV PM.  Model # [id number].  Serial # [id number]     TUBAL LIGATION       Family History  Problem Relation Age of Onset   Cancer Mother        cervical   Heart disease Mother        CABGx3 in 2014   CAD Father    Congestive Heart Failure Father    Hypertension Father    Renal Disease Father        CKD, ESRD   Hypertension Brother    Hypertension Brother    Hypertension Sister      Social History   Socioeconomic History   Marital status: Married    Spouse name: Not on file   Number of children: Not on file   Years of education: Not on file   Highest education level: Not on file  Occupational History   Not on file  Tobacco Use   Smoking status: Never   Smokeless tobacco: Never  Vaping Use   Vaping Use: Never used  Substance and Sexual Activity   Alcohol use: Yes  Comment: occasionally   Drug use: No   Sexual activity: Not on file  Other Topics Concern   Not on file  Social History Narrative   Not on file   Social Determinants of Health   Financial Resource Strain: Not on file  Food Insecurity: Not on file  Transportation Needs: Not on file  Physical Activity: Not on file  Stress: Not on file  Social Connections: Not on file  Intimate Partner Violence: Not on file     BP (!) 110/90   Pulse 65   Ht 5\' 6"  (1.676 m)   Wt 213 lb (96.6 kg)   SpO2 99%   BMI 34.38 kg/m   Physical Exam:  Well appearing overweight woman, NAD HEENT: Unremarkable Neck:  No JVD, no thyromegally Lymphatics:  No adenopathy Back:  No CVA tenderness Lungs:  Clear with no wheezes HEART:  Regular rate rhythm, no murmurs, no rubs, no clicks Abd:  soft, positive bowel sounds, no organomegally, no rebound, no guarding Ext:  2 plus pulses, no  edema, no cyanosis, no clubbing Skin:  No rashes no nodules Neuro:  CN II through XII intact, motor grossly intact  EKG - NSR with ventricular pacing  DEVICE  Normal device function.  See PaceArt for details.   Assess/Plan:  1. Chronic systolic heart failure - her symptoms are really class 1 at this point. She will continue her current meds. 2. PPM - her St. Jude Biv PPM is working normally.  3. Overweight - her BMI is 34 and she is encouraged to lose weight.    Ladarius Seubert,MD

## 2022-07-21 NOTE — Patient Instructions (Addendum)
Medication Instructions:  Your physician recommends that you continue on your current medications as directed. Please refer to the Current Medication list given to you today.  *If you need a refill on your cardiac medications before your next appointment, please call your pharmacy*  NO CHANGES TO YOUR MEDICATIONS  Lab Work: None ordered.  If you have labs (blood work) drawn today and your tests are completely normal, you will receive your results only by: MyChart Message (if you have MyChart) OR A paper copy in the mail If you have any lab test that is abnormal or we need to change your treatment, we will call you to review the results.  Testing/Procedures: None ordered.  Follow-Up: IN 1 Year with DR Ladona Ridgel    We recommend signing up for the patient portal called "MyChart".  Sign up information is provided on this After Visit Summary.  MyChart is used to connect with patients for Virtual Visits (Telemedicine).  Patients are able to view lab/test results, encounter notes, upcoming appointments, etc.  Non-urgent messages can be sent to your provider as well.   To learn more about what you can do with MyChart, go to [email or web address].    Provider:   Lewayne Bunting, MD{or one of the following Advanced Practice Providers on your designated Care Team:   Francis Dowse, [place] Casimiro Needle "Mardelle Matte" Lanna Poche, [place]  Remote monitoring is used to monitor your Pacemaker/ ICD from home. This monitoring reduces the number of office visits required to check your device to one time per year. It allows [place] to keep an eye on the functioning of your device to ensure it is working properly. You are scheduled for a device check from home on 09/22/22. You may send your transmission at any time that day. If you have a wireless device, the transmission will be sent automatically. After your physician reviews your transmission, you will receive a postcard with your next transmission date.  Important Information About  Sugar

## 2022-08-10 ENCOUNTER — Other Ambulatory Visit: Payer: Self-pay | Admitting: Internal Medicine

## 2022-09-17 DIAGNOSIS — N950 Postmenopausal bleeding: Secondary | ICD-10-CM | POA: Diagnosis not present

## 2022-09-22 ENCOUNTER — Ambulatory Visit (INDEPENDENT_AMBULATORY_CARE_PROVIDER_SITE_OTHER): Payer: BC Managed Care – PPO

## 2022-09-22 DIAGNOSIS — I495 Sick sinus syndrome: Secondary | ICD-10-CM | POA: Diagnosis not present

## 2022-09-22 LAB — CUP PACEART REMOTE DEVICE CHECK
Battery Remaining Longevity: 19 mo
Battery Remaining Percentage: 23 %
Battery Voltage: 2.92 V
Brady Statistic AP VP Percent: 1.9 %
Brady Statistic AP VS Percent: 1 %
Brady Statistic AS VP Percent: 98 %
Brady Statistic AS VS Percent: 1 %
Brady Statistic RA Percent Paced: 1.8 %
Date Time Interrogation Session: 20231010074646
Implantable Lead Implant Date: 20110113
Implantable Lead Implant Date: 20110113
Implantable Lead Implant Date: 20110113
Implantable Lead Location: 753858
Implantable Lead Location: 753859
Implantable Lead Location: 753860
Implantable Pulse Generator Implant Date: 20180719
Lead Channel Impedance Value: 230 Ohm
Lead Channel Impedance Value: 440 Ohm
Lead Channel Impedance Value: 730 Ohm
Lead Channel Pacing Threshold Amplitude: 0.625 V
Lead Channel Pacing Threshold Amplitude: 1.25 V
Lead Channel Pacing Threshold Amplitude: 1.5 V
Lead Channel Pacing Threshold Pulse Width: 0.4 ms
Lead Channel Pacing Threshold Pulse Width: 0.5 ms
Lead Channel Pacing Threshold Pulse Width: 0.6 ms
Lead Channel Sensing Intrinsic Amplitude: 12 mV
Lead Channel Sensing Intrinsic Amplitude: 2.8 mV
Lead Channel Setting Pacing Amplitude: 1.625
Lead Channel Setting Pacing Amplitude: 2.5 V
Lead Channel Setting Pacing Amplitude: 2.5 V
Lead Channel Setting Pacing Pulse Width: 0.5 ms
Lead Channel Setting Pacing Pulse Width: 0.6 ms
Lead Channel Setting Sensing Sensitivity: 2 mV
Pulse Gen Model: 3222
Pulse Gen Serial Number: 8007164

## 2022-10-07 NOTE — Progress Notes (Signed)
Remote pacemaker transmission.   

## 2022-10-13 ENCOUNTER — Other Ambulatory Visit: Payer: Self-pay | Admitting: Internal Medicine

## 2022-11-16 DIAGNOSIS — Z01419 Encounter for gynecological examination (general) (routine) without abnormal findings: Secondary | ICD-10-CM | POA: Diagnosis not present

## 2022-11-16 DIAGNOSIS — Z6835 Body mass index (BMI) 35.0-35.9, adult: Secondary | ICD-10-CM | POA: Diagnosis not present

## 2022-11-16 DIAGNOSIS — Z124 Encounter for screening for malignant neoplasm of cervix: Secondary | ICD-10-CM | POA: Diagnosis not present

## 2022-11-16 DIAGNOSIS — Z1231 Encounter for screening mammogram for malignant neoplasm of breast: Secondary | ICD-10-CM | POA: Diagnosis not present

## 2022-11-16 DIAGNOSIS — Z1151 Encounter for screening for human papillomavirus (HPV): Secondary | ICD-10-CM | POA: Diagnosis not present

## 2022-11-16 LAB — HM MAMMOGRAPHY

## 2022-11-18 ENCOUNTER — Encounter: Payer: Self-pay | Admitting: Family Medicine

## 2022-11-18 ENCOUNTER — Ambulatory Visit: Payer: BC Managed Care – PPO | Admitting: Family Medicine

## 2022-11-18 VITALS — BP 124/82 | HR 75 | Temp 97.8°F | Ht 66.0 in | Wt 216.0 lb

## 2022-11-18 DIAGNOSIS — J069 Acute upper respiratory infection, unspecified: Secondary | ICD-10-CM

## 2022-11-18 DIAGNOSIS — G4733 Obstructive sleep apnea (adult) (pediatric): Secondary | ICD-10-CM | POA: Diagnosis not present

## 2022-11-18 DIAGNOSIS — I10 Essential (primary) hypertension: Secondary | ICD-10-CM | POA: Diagnosis not present

## 2022-11-18 DIAGNOSIS — L309 Dermatitis, unspecified: Secondary | ICD-10-CM | POA: Insufficient documentation

## 2022-11-18 LAB — HM PAP SMEAR

## 2022-11-18 NOTE — Patient Instructions (Signed)
Is a pleasure meeting you today. Thank you for trusting [place] with your health care.  You appear to have a viral upper respiratory infection. Continue treating your symptoms with Coricidin, Mucinex, saline nasal spray, Flonase, Tylenol or ibuprofen as needed.  Salt water gargles for your sore throat as needed. Stay well-hydrated.  Follow-up if you are not improving in the next 3 to 4 days or if you are getting worse.

## 2022-11-18 NOTE — Progress Notes (Signed)
New Patient Office Visit  Subjective    Patient ID: Alexis Clay, female    DOB: 1965-12-17  Age: 56 y.o. MRN: [id number]  CC:  Chief Complaint  Patient presents with   Establish Care    Has been having a head cold since Monday, other sx have elevated except for facial pressure    HPI Alexis Clay presents to establish care No previous PCP.   Other providers:  Cardiologist- Dr. Tamala Julian and Dr. Lovena Le  OB/GYN   OSA- uses CPAP   C/o a 3 day hx of nasal congestion, rhinorrhea, and cough.   No fever, chills, body aches, dizziness, chest pain, palpitations, shortness of breath, N/V/D.  She did use a sinus rinse.   Negative Covid test.    Works at [employer].   Outpatient Encounter Medications as of 11/18/2022  Medication Sig   aspirin EC 81 MG tablet Take 81 mg by mouth daily.   carvedilol (COREG) 25 MG tablet TAKE 1 TABLET BY MOUTH TWICE A DAY   lisinopril-hydrochlorothiazide (ZESTORETIC) 20-25 MG tablet TAKE 1 TABLET BY MOUTH EVERY DAY   [DISCONTINUED] meloxicam (MOBIC) 15 MG tablet Take 1 tablet (15 mg total) by mouth daily. (Patient not taking: Reported on 07/21/2022)   No facility-administered encounter medications on file as of 11/18/2022.    Past Medical History:  Diagnosis Date   Anxiety disorder    Biventricular cardiac pacemaker in situ    Bradycardia    Cardiomyopathy ([place])    CHF (congestive heart failure) (HCC)    Dilated cardiomyopathy (HCC)    Headache    Hypertension, benign    LBBB (left bundle branch block)    Panic disorder without agoraphobia    Presence of permanent cardiac pacemaker    Sinoatrial node dysfunction (HCC)    Sinus node dysfunction (HCC)    Supraventricular premature beats    SVT (supraventricular tachycardia)    Systolic heart failure ([place])    Due to hypertension (LVEF 35-55% after med therapy & resynchronizatoin)    Past Surgical History:  Procedure Laterality Date   BIV PACEMAKER GENERATOR CHANGEOUT N/A  07/01/2017   Procedure: [place];  Surgeon: Evans Lance, MD;  Location: [place] CV LAB;  Service: Cardiovascular;  Laterality: N/A;   CARDIAC CATHETERIZATION  2011   With widely patent coronary arteries    CESAREAN SECTION  1991   COLONOSCOPY WITH PROPOFOL N/A 02/04/2016   Procedure: COLONOSCOPY WITH PROPOFOL;  Surgeon: Juanita Craver, MD;  Location: [facility]OS[facility];  Service: Endoscopy;  Laterality: N/A;   LEEP  10/2010   PACEMAKER INSERTION  12/26/2009   St. Jude BiV PM.  Model # [id number].  Serial # [id number]     TUBAL LIGATION      Family History  Problem Relation Age of Onset   Cancer Mother        cervical   Heart disease Mother        CABGx3 in 2014   CAD Father    Congestive Heart Failure Father    Hypertension Father    Renal Disease Father        CKD, ESRD   Hypertension Brother    Hypertension Brother    Hypertension Sister     Social History   Socioeconomic History   Marital status: Married    Spouse name: Not on file   Number of children: Not on file   Years of education: Not on file   Highest education level: Not on file  Occupational History   Not on file  Tobacco Use   Smoking status: Never   Smokeless tobacco: Never  Vaping Use   Vaping Use: Never used  Substance and Sexual Activity   Alcohol use: Yes    Comment: occasionally   Drug use: No   Sexual activity: Not on file  Other Topics Concern   Not on file  Social History Narrative   Not on file   Social Determinants of Health   Financial Resource Strain: Not on file  Food Insecurity: Not on file  Transportation Needs: Not on file  Physical Activity: Not on file  Stress: Not on file  Social Connections: Not on file  Intimate Partner Violence: Not on file    ROS      Objective    BP 124/82 (BP Location: Left Arm, Patient Position: Sitting, Cuff Size: Large)   Pulse 75   Temp 97.8 F (36.6 C) (Temporal)   Ht 5\' 6"  (1.676 m)   Wt 216 lb (98 kg)   SpO2 99%    BMI 34.86 kg/m   Physical Exam Constitutional:      General: She is not in acute distress.    Appearance: She is not ill-appearing.  HENT:     Right Ear: Tympanic membrane and ear canal normal.     Left Ear: Tympanic membrane and ear canal normal.     Nose: Congestion present.     Mouth/Throat:     Mouth: Mucous membranes are moist.     Pharynx: Posterior oropharyngeal erythema present.  Eyes:     Conjunctiva/sclera: Conjunctivae normal.  Cardiovascular:     Rate and Rhythm: Normal rate and regular rhythm.  Pulmonary:     Effort: Pulmonary effort is normal.     Breath sounds: Normal breath sounds.  Neurological:     Mental Status: She is alert.         Assessment & Plan:   Problem List Items Addressed This Visit       Cardiovascular and Mediastinum   Essential hypertension   Other Visit Diagnoses     Acute URI    -  Primary   OSA on CPAP          She is new to the practice and here to establish care.  viral upper respiratory infection. Counseling on symptomatic treatment with Coricidin, Mucinex, saline nasal spray, Flonase, Tylenol or ibuprofen as needed.  Salt water gargles for sore throat as needed. Stay well-hydrated. Follow up if not improving in 3-4 days or if worsening.  BP controlled.    Return if symptoms worsen or fail to improve.   , NP-C

## 2022-12-01 ENCOUNTER — Encounter: Payer: Self-pay | Admitting: Family Medicine

## 2022-12-17 ENCOUNTER — Ambulatory Visit
Admission: RE | Admit: 2022-12-17 | Discharge: 2022-12-17 | Disposition: A | Discharge status: Home / Self Care | Payer: No Typology Code available for payment source | Source: Ambulatory Visit | Attending: Internal Medicine | Admitting: Internal Medicine

## 2022-12-17 ENCOUNTER — Telehealth: Payer: Self-pay

## 2022-12-17 ENCOUNTER — Ambulatory Visit: Payer: No Typology Code available for payment source | Attending: Internal Medicine

## 2022-12-17 ENCOUNTER — Telehealth: Payer: Self-pay | Admitting: Internal Medicine

## 2022-12-17 DIAGNOSIS — T82110A Breakdown (mechanical) of cardiac electrode, initial encounter: Secondary | ICD-10-CM

## 2022-12-17 LAB — CUP PACEART INCLINIC DEVICE CHECK
Battery Remaining Longevity: 15 mo
Battery Voltage: 2.89 V
Brady Statistic RA Percent Paced: 1.1 %
Brady Statistic RV Percent Paced: 99.61 %
Date Time Interrogation Session: 20240104101648
Implantable Lead Connection Status: 753985
Implantable Lead Connection Status: 753985
Implantable Lead Connection Status: 753985
Implantable Lead Implant Date: 20110113
Implantable Lead Implant Date: 20110113
Implantable Lead Implant Date: 20110113
Implantable Lead Location: 753858
Implantable Lead Location: 753859
Implantable Lead Location: 753860
Implantable Pulse Generator Implant Date: 20180719
Lead Channel Impedance Value: 2137.5 Ohm
Lead Channel Impedance Value: 225 Ohm
Lead Channel Impedance Value: 437.5 Ohm
Lead Channel Pacing Threshold Amplitude: 0.625 V
Lead Channel Pacing Threshold Amplitude: 0.75 V
Lead Channel Pacing Threshold Amplitude: 1.125 V
Lead Channel Pacing Threshold Pulse Width: 0.4 ms
Lead Channel Pacing Threshold Pulse Width: 0.4 ms
Lead Channel Pacing Threshold Pulse Width: 0.4 ms
Lead Channel Sensing Intrinsic Amplitude: 12 mV
Lead Channel Sensing Intrinsic Amplitude: 4.1 mV
Lead Channel Setting Pacing Amplitude: 1.625
Lead Channel Setting Pacing Amplitude: 2.5 V
Lead Channel Setting Pacing Amplitude: 2.5 V
Lead Channel Setting Pacing Pulse Width: 0.5 ms
Lead Channel Setting Pacing Pulse Width: 0.6 ms
Lead Channel Setting Sensing Sensitivity: 2 mV
Pulse Gen Model: 3222
Pulse Gen Serial Number: 8007164

## 2022-12-17 NOTE — Progress Notes (Signed)
Pt seen in clinic for alert for increase in LV impedance. Confirmed increase in clinic.   Chest xray ordered. Follow up scheduled with [facility].

## 2022-12-17 NOTE — Telephone Encounter (Signed)
  Pt is calling, she said, the imaging place she went to is close and will go to a different imaging office for her chest xray. She wants to send this message to nurse Sonia Baller

## 2022-12-17 NOTE — Telephone Encounter (Signed)
Returned call to Pt.  The [place] center at [facility] is closed.  She went to [place] [facility]-they were able to see chest xray.  Will await results of xray.

## 2022-12-17 NOTE — Patient Instructions (Signed)
Medication Instructions:  Your physician recommends that you continue on your current medications as directed. Please refer to the Current Medication list given to you today.  Labwork: None ordered.  Testing/Procedures: A Chest X-Ray was ordered.   Follow-Up: Your physician wants you to follow-up tomorrow 12/18/22 at 8:45 am with Dr. Lovena Le.  Any Other Special Instructions Will Be Listed Below (If Applicable).  If you need a refill on your cardiac medications before your next appointment, please call your pharmacy.   Important Information About Sugar

## 2022-12-17 NOTE — Telephone Encounter (Signed)
Alert received from [facility]:  Alert remote reviewed. Normal device function.   There were two atrial arrhythmias detected that were both less than one minute  Alert for LV lead impedance of 2100 ohms, sent to triage.  Outreach made to Pt.  Pt will come to office today for device check.

## 2022-12-18 ENCOUNTER — Encounter: Payer: Self-pay | Admitting: Internal Medicine

## 2022-12-18 ENCOUNTER — Ambulatory Visit: Payer: BC Managed Care – PPO | Attending: Internal Medicine | Admitting: Internal Medicine

## 2022-12-18 VITALS — BP 144/80 | HR 78 | Ht 65.5 in | Wt 216.0 lb

## 2022-12-18 DIAGNOSIS — I5022 Chronic systolic (congestive) heart failure: Secondary | ICD-10-CM

## 2022-12-18 DIAGNOSIS — Z950 Presence of cardiac pacemaker: Secondary | ICD-10-CM | POA: Diagnosis not present

## 2022-12-18 DIAGNOSIS — I495 Sick sinus syndrome: Secondary | ICD-10-CM

## 2022-12-18 LAB — CUP PACEART INCLINIC DEVICE CHECK
Battery Remaining Longevity: 7 mo
Battery Voltage: 2.89 V
Brady Statistic RA Percent Paced: 1.1 %
Brady Statistic RV Percent Paced: 99.61 %
Date Time Interrogation Session: 20240105091521
Implantable Lead Connection Status: 753985
Implantable Lead Connection Status: 753985
Implantable Lead Connection Status: 753985
Implantable Lead Implant Date: 20110113
Implantable Lead Implant Date: 20110113
Implantable Lead Implant Date: 20110113
Implantable Lead Location: 753858
Implantable Lead Location: 753859
Implantable Lead Location: 753860
Implantable Pulse Generator Implant Date: 20180719
Lead Channel Impedance Value: 250 Ohm
Lead Channel Impedance Value: 387.5 Ohm
Lead Channel Impedance Value: 437.5 Ohm
Lead Channel Pacing Threshold Amplitude: 0.625 V
Lead Channel Pacing Threshold Amplitude: 1.125 V
Lead Channel Pacing Threshold Amplitude: 2 V
Lead Channel Pacing Threshold Amplitude: 2 V
Lead Channel Pacing Threshold Pulse Width: 0.4 ms
Lead Channel Pacing Threshold Pulse Width: 0.4 ms
Lead Channel Pacing Threshold Pulse Width: 0.8 ms
Lead Channel Pacing Threshold Pulse Width: 0.8 ms
Lead Channel Sensing Intrinsic Amplitude: 10 mV
Lead Channel Sensing Intrinsic Amplitude: 4.4 mV
Lead Channel Setting Pacing Amplitude: 1.625
Lead Channel Setting Pacing Amplitude: 2.5 V
Lead Channel Setting Pacing Amplitude: 3.5 V
Lead Channel Setting Pacing Pulse Width: 0.6 ms
Lead Channel Setting Pacing Pulse Width: 0.8 ms
Lead Channel Setting Sensing Sensitivity: 2 mV
Pulse Gen Model: 3222
Pulse Gen Serial Number: 8007164

## 2022-12-18 NOTE — Patient Instructions (Addendum)
Medication Instructions:  Your physician recommends that you continue on your current medications as directed. Please refer to the Current Medication list given to you today.  *If you need a refill on your cardiac medications before your next appointment, please call your pharmacy*  Lab Work: None ordered.  If you have labs (blood work) drawn today and your tests are completely normal, you will receive your results only by: [place] (if you have MyChart) OR A paper copy in the mail If you have any lab test that is abnormal or we need to change your treatment, we will call you to review the results.  Testing/Procedures: None ordered.  Follow-Up: At [facility], you and your health needs are our priority.  As part of our continuing mission to provide you with exceptional heart care, we have created designated Provider Care Teams.  These Care Teams include your primary Cardiologist (physician) and Advanced Practice Providers (APPs -  Physician Assistants and Nurse Practitioners) who all work together to provide you with the care you need, when you need it.  We recommend signing up for the patient portal called "MyChart".  Sign up information is provided on this After Visit Summary.  MyChart is used to connect with patients for Virtual Visits (Telemedicine).  Patients are able to view lab/test results, encounter notes, upcoming appointments, etc.  Non-urgent messages can be sent to your provider as well.   To learn more about what you can do with MyChart, go to [email or web address].    Your next appointment:   Please schedule a 6 month follow up with Dr. Lovena Le  The format for your next appointment:   In Person  Provider:   Cristopher Peru, MD{or one of the following Advanced Practice Providers on your designated Care Team:   Tommye Standard, [place] Legrand Como "[place]" [place], [place]

## 2022-12-18 NOTE — Progress Notes (Signed)
HPI Alexis Clay returns today for followup. She is a pleasant 57 yo woman with a h/o HTN, obesity and chronic systolic heart failure, s/p biv PPM insertion. In the interim she has been stable. She denies chest pain and her CHF is class 2A. She denies edema. Her Biv PPM has been in place over 5 years. She denies syncope.   No Known Allergies   Current Outpatient Medications  Medication Sig Dispense Refill   aspirin EC 81 MG tablet Take 81 mg by mouth daily.     carvedilol (COREG) 25 MG tablet TAKE 1 TABLET BY MOUTH TWICE A DAY 180 tablet 2   lisinopril-hydrochlorothiazide (ZESTORETIC) 20-25 MG tablet TAKE 1 TABLET BY MOUTH EVERY DAY 90 tablet 3   No current facility-administered medications for this visit.     Past Medical History:  Diagnosis Date   Anxiety disorder    Biventricular cardiac pacemaker in situ    Bradycardia    Cardiomyopathy ([place])    CHF (congestive heart failure) (HCC)    Dilated cardiomyopathy (HCC)    Headache    Hypertension, benign    LBBB (left bundle branch block)    Panic disorder without agoraphobia    Presence of permanent cardiac pacemaker    Sinoatrial node dysfunction (HCC)    Sinus node dysfunction (HCC)    Supraventricular premature beats    SVT (supraventricular tachycardia)    Systolic heart failure (HCC)    Due to hypertension (LVEF 35-55% after med therapy & resynchronizatoin)    ROS:   All systems reviewed and negative except as noted in the HPI.   Past Surgical History:  Procedure Laterality Date   BIV PACEMAKER GENERATOR CHANGEOUT N/A 07/01/2017   Procedure: [place];  Surgeon: Evans Lance, MD;  Location: [place] [facility];  Service: Cardiovascular;  Laterality: N/A;   CARDIAC CATHETERIZATION  2011   With widely patent coronary arteries    CESAREAN SECTION  1991   COLONOSCOPY WITH PROPOFOL N/A 02/04/2016   Procedure: COLONOSCOPY WITH PROPOFOL;  Surgeon: Juanita Craver, MD;  Location: [facility];  Service: Endoscopy;  Laterality: N/A;   LEEP  10/2010   PACEMAKER INSERTION  12/26/2009   St. Jude BiV PM.  Model # [id number].  Serial # [id number]     TUBAL LIGATION       Family History  Problem Relation Age of Onset   Cancer Mother        cervical   Heart disease Mother        CABGx3 in 2014   CAD Father    Congestive Heart Failure Father    Hypertension Father    Renal Disease Father        CKD, ESRD   Hypertension Brother    Hypertension Brother    Hypertension Sister      Social History   Socioeconomic History   Marital status: Married    Spouse name: Not on file   Number of children: Not on file   Years of education: Not on file   Highest education level: Not on file  Occupational History   Not on file  Tobacco Use   Smoking status: Never   Smokeless tobacco: Never  Vaping Use   Vaping Use: Never used  Substance and Sexual Activity   Alcohol use: Yes    Comment: occasionally   Drug use: No   Sexual activity: Not on file  Other Topics Concern   Not on  file  Social History Narrative   Not on file   Social Determinants of Health   Financial Resource Strain: Not on file  Food Insecurity: Not on file  Transportation Needs: Not on file  Physical Activity: Not on file  Stress: Not on file  Social Connections: Not on file  Intimate Partner Violence: Not on file     BP (!) 144/80   Pulse 78   Ht 5' 5.5" (1.664 m)   Wt 216 lb (98 kg)   SpO2 97%   BMI 35.40 kg/m   Physical Exam:  Well appearing NAD HEENT: Unremarkable Neck:  No JVD, no thyromegally Lymphatics:  No adenopathy Back:  No CVA tenderness Lungs:  Clear HEART:  Regular rate rhythm, no murmurs, no rubs, no clicks Abd:  soft, positive bowel sounds, no organomegally, no rebound, no guarding Ext:  2 plus pulses, no edema, no cyanosis, no clubbing Skin:  No rashes no nodules Neuro:  CN II through XII intact, motor grossly intact  DEVICE  Normal device function.  See PaceArt  for details.   Assess/Plan: Chronic systolic heart failure -her symptoms are class 2. She will continue her current meds. Biv PPM - her device is working normally but her LV threshold has gone up some. She will undergo watchful waiting. We discussed the treatment options when her device comes up for gen change which would include adding a new lead or extraction and a new lead.  HTN - her bp is up a bit today. She is encouraged to take her meds and maintain a low sodium diet. Obesity - she will be encouraged to lose weight.   Carleene Overlie Shatoya Roets,MD

## 2022-12-22 ENCOUNTER — Ambulatory Visit (INDEPENDENT_AMBULATORY_CARE_PROVIDER_SITE_OTHER): Payer: No Typology Code available for payment source

## 2022-12-22 DIAGNOSIS — I495 Sick sinus syndrome: Secondary | ICD-10-CM

## 2022-12-22 LAB — CUP PACEART REMOTE DEVICE CHECK
Battery Remaining Longevity: 10 mo
Battery Remaining Percentage: 19 %
Battery Voltage: 2.89 V
Brady Statistic AP VP Percent: 1.4 %
Brady Statistic AP VS Percent: 1 %
Brady Statistic AS VP Percent: 98 %
Brady Statistic AS VS Percent: 1 %
Brady Statistic RA Percent Paced: 1.3 %
Date Time Interrogation Session: 20240109032909
Implantable Lead Connection Status: 753985
Implantable Lead Connection Status: 753985
Implantable Lead Connection Status: 753985
Implantable Lead Implant Date: 20110113
Implantable Lead Implant Date: 20110113
Implantable Lead Implant Date: 20110113
Implantable Lead Location: 753858
Implantable Lead Location: 753859
Implantable Lead Location: 753860
Implantable Pulse Generator Implant Date: 20180719
Lead Channel Impedance Value: 260 Ohm
Lead Channel Impedance Value: 430 Ohm
Lead Channel Impedance Value: 450 Ohm
Lead Channel Pacing Threshold Amplitude: 0.625 V
Lead Channel Pacing Threshold Amplitude: 1.25 V
Lead Channel Pacing Threshold Amplitude: 2 V
Lead Channel Pacing Threshold Pulse Width: 0.4 ms
Lead Channel Pacing Threshold Pulse Width: 0.6 ms
Lead Channel Pacing Threshold Pulse Width: 0.8 ms
Lead Channel Sensing Intrinsic Amplitude: 0.8 mV
Lead Channel Sensing Intrinsic Amplitude: 10 mV
Lead Channel Setting Pacing Amplitude: 1.625
Lead Channel Setting Pacing Amplitude: 2.5 V
Lead Channel Setting Pacing Amplitude: 3.5 V
Lead Channel Setting Pacing Pulse Width: 0.6 ms
Lead Channel Setting Pacing Pulse Width: 0.8 ms
Lead Channel Setting Sensing Sensitivity: 2 mV
Pulse Gen Model: 3222
Pulse Gen Serial Number: 8007164

## 2023-01-15 NOTE — Progress Notes (Signed)
Remote pacemaker transmission.   

## 2023-03-23 ENCOUNTER — Ambulatory Visit (INDEPENDENT_AMBULATORY_CARE_PROVIDER_SITE_OTHER): Payer: No Typology Code available for payment source

## 2023-03-23 DIAGNOSIS — I495 Sick sinus syndrome: Secondary | ICD-10-CM | POA: Diagnosis not present

## 2023-03-24 ENCOUNTER — Telehealth: Payer: Self-pay

## 2023-03-24 LAB — CUP PACEART REMOTE DEVICE CHECK
Battery Remaining Longevity: 5 mo
Battery Remaining Percentage: 12 %
Battery Voltage: 2.84 V
Brady Statistic AP VP Percent: 2.5 %
Brady Statistic AP VS Percent: 1 %
Brady Statistic AS VP Percent: 97 %
Brady Statistic AS VS Percent: 1 %
Brady Statistic RA Percent Paced: 2.3 %
Date Time Interrogation Session: 20240409050606
Implantable Lead Connection Status: 753985
Implantable Lead Connection Status: 753985
Implantable Lead Connection Status: 753985
Implantable Lead Implant Date: 20110113
Implantable Lead Implant Date: 20110113
Implantable Lead Implant Date: 20110113
Implantable Lead Location: 753858
Implantable Lead Location: 753859
Implantable Lead Location: 753860
Implantable Pulse Generator Implant Date: 20180719
Lead Channel Impedance Value: 230 Ohm
Lead Channel Impedance Value: 400 Ohm
Lead Channel Impedance Value: 430 Ohm
Lead Channel Pacing Threshold Amplitude: 0.625 V
Lead Channel Pacing Threshold Amplitude: 1.25 V
Lead Channel Pacing Threshold Amplitude: 2 V
Lead Channel Pacing Threshold Pulse Width: 0.4 ms
Lead Channel Pacing Threshold Pulse Width: 0.6 ms
Lead Channel Pacing Threshold Pulse Width: 0.8 ms
Lead Channel Sensing Intrinsic Amplitude: 10 mV
Lead Channel Sensing Intrinsic Amplitude: 3.1 mV
Lead Channel Setting Pacing Amplitude: 1.625
Lead Channel Setting Pacing Amplitude: 2.5 V
Lead Channel Setting Pacing Amplitude: 3.5 V
Lead Channel Setting Pacing Pulse Width: 0.6 ms
Lead Channel Setting Pacing Pulse Width: 0.8 ms
Lead Channel Setting Sensing Sensitivity: 2 mV
Pulse Gen Model: 3222
Pulse Gen Serial Number: 8007164

## 2023-03-24 NOTE — Telephone Encounter (Signed)
Alert received from [facility]:  Scheduled remote reviewed. Normal device function.   21 AMS, longest duration , EGM's show atrial driven 1:1 Battery estimated 5.16mo - route to triage Next remote to be determined  Monthly battery checks scheduled.

## 2023-03-25 NOTE — Telephone Encounter (Signed)
Outreach made to Pt.  Advised would check her device monthly now that her battery life <6 months.  She has follow up scheduled in June 2024 with GT.

## 2023-04-26 ENCOUNTER — Ambulatory Visit (INDEPENDENT_AMBULATORY_CARE_PROVIDER_SITE_OTHER): Payer: No Typology Code available for payment source

## 2023-04-26 DIAGNOSIS — I495 Sick sinus syndrome: Secondary | ICD-10-CM

## 2023-04-26 LAB — CUP PACEART REMOTE DEVICE CHECK
Battery Remaining Longevity: 4 mo
Battery Remaining Percentage: 10 %
Battery Voltage: 2.81 V
Brady Statistic AP VP Percent: 2.1 %
Brady Statistic AP VS Percent: 1 %
Brady Statistic AS VP Percent: 97 %
Brady Statistic AS VS Percent: 1 %
Brady Statistic RA Percent Paced: 1.9 %
Date Time Interrogation Session: 20240513032057
Implantable Lead Connection Status: 753985
Implantable Lead Connection Status: 753985
Implantable Lead Connection Status: 753985
Implantable Lead Implant Date: 20110113
Implantable Lead Implant Date: 20110113
Implantable Lead Implant Date: 20110113
Implantable Lead Location: 753858
Implantable Lead Location: 753859
Implantable Lead Location: 753860
Implantable Pulse Generator Implant Date: 20180719
Lead Channel Impedance Value: 230 Ohm
Lead Channel Impedance Value: 410 Ohm
Lead Channel Impedance Value: 410 Ohm
Lead Channel Pacing Threshold Amplitude: 0.75 V
Lead Channel Pacing Threshold Amplitude: 1.25 V
Lead Channel Pacing Threshold Amplitude: 2 V
Lead Channel Pacing Threshold Pulse Width: 0.4 ms
Lead Channel Pacing Threshold Pulse Width: 0.6 ms
Lead Channel Pacing Threshold Pulse Width: 0.8 ms
Lead Channel Sensing Intrinsic Amplitude: 10 mV
Lead Channel Sensing Intrinsic Amplitude: 2.3 mV
Lead Channel Setting Pacing Amplitude: 1.75 V
Lead Channel Setting Pacing Amplitude: 2.5 V
Lead Channel Setting Pacing Amplitude: 3.5 V
Lead Channel Setting Pacing Pulse Width: 0.6 ms
Lead Channel Setting Pacing Pulse Width: 0.8 ms
Lead Channel Setting Sensing Sensitivity: 2 mV
Pulse Gen Model: 3222
Pulse Gen Serial Number: 8007164

## 2023-04-30 NOTE — Progress Notes (Signed)
Remote pacemaker transmission.   

## 2023-05-24 NOTE — Progress Notes (Signed)
Remote pacemaker transmission.   

## 2023-06-10 ENCOUNTER — Encounter: Payer: Self-pay | Admitting: Internal Medicine

## 2023-06-10 ENCOUNTER — Ambulatory Visit: Payer: No Typology Code available for payment source | Attending: Internal Medicine | Admitting: Internal Medicine

## 2023-06-10 VITALS — BP 138/78 | HR 71 | Ht 66.0 in | Wt 212.0 lb

## 2023-06-10 DIAGNOSIS — I495 Sick sinus syndrome: Secondary | ICD-10-CM

## 2023-06-10 NOTE — Progress Notes (Signed)
HPI Alexis Clay returns today for followup. She is a pleasant 57 yo woman with a h/o HTN, obesity and chronic systolic heart failure, s/p biv PPM insertion. In the interim she has been stable. She denies chest pain and her CHF is class 2A. She denies edema. Her Biv PPM has been in place almost 6 years. She denies syncope.  No Known Allergies   Current Outpatient Medications  Medication Sig Dispense Refill   aspirin EC 81 MG tablet Take 81 mg by mouth daily.     carvedilol (COREG) 25 MG tablet TAKE 1 TABLET BY MOUTH TWICE A DAY 180 tablet 2   lisinopril-hydrochlorothiazide (ZESTORETIC) 20-25 MG tablet TAKE 1 TABLET BY MOUTH EVERY DAY 90 tablet 3   No current facility-administered medications for this visit.     Past Medical History:  Diagnosis Date   Anxiety disorder    Biventricular cardiac pacemaker in situ    Bradycardia    Cardiomyopathy (HCC)    CHF (congestive heart failure) (HCC)    Dilated cardiomyopathy (HCC)    Headache    Hypertension, benign    LBBB (left bundle branch block)    Panic disorder without agoraphobia    Presence of permanent cardiac pacemaker    Sinoatrial node dysfunction (HCC)    Sinus node dysfunction (HCC)    Supraventricular premature beats    SVT (supraventricular tachycardia)    Systolic heart failure (HCC)    Due to hypertension (LVEF 35-55% after med therapy & resynchronizatoin)    ROS:   All systems reviewed and negative except as noted in the HPI.   Past Surgical History:  Procedure Laterality Date   BIV PACEMAKER GENERATOR CHANGEOUT N/A 07/01/2017   Procedure: BiV Pacemaker Generator Changeout;  Surgeon: Marinus Maw, MD;  Location: [facility] INVASIVE [facility];  Service: Cardiovascular;  Laterality: N/A;   CARDIAC CATHETERIZATION  2011   With widely patent coronary arteries    CESAREAN SECTION  1991   COLONOSCOPY WITH PROPOFOL N/A 02/04/2016   Procedure: COLONOSCOPY WITH PROPOFOL;  Surgeon: Charna Elizabeth, MD;  Location: [facility];  Service: Endoscopy;  Laterality: N/A;   LEEP  10/2010   PACEMAKER INSERTION  12/26/2009   St. Jude BiV PM.  Model # [id number].  Serial # [id number]     TUBAL LIGATION       Family History  Problem Relation Age of Onset   Cancer Mother        cervical   Heart disease Mother        CABGx3 in 2014   CAD Father    Congestive Heart Failure Father    Hypertension Father    Renal Disease Father        CKD, ESRD   Hypertension Brother    Hypertension Brother    Hypertension Sister      Social History   Socioeconomic History   Marital status: Married    Spouse name: Not on file   Number of children: Not on file   Years of education: Not on file   Highest education level: Not on file  Occupational History   Not on file  Tobacco Use   Smoking status: Never   Smokeless tobacco: Never  Vaping Use   Vaping Use: Never used  Substance and Sexual Activity   Alcohol use: Yes    Comment: occasionally   Drug use: No   Sexual activity: Not on file  Other Topics Concern   Not on file  Social History Narrative   Not on file   Social Determinants of Health   Financial Resource Strain: Not on file  Food Insecurity: Not on file  Transportation Needs: Not on file  Physical Activity: Not on file  Stress: Not on file  Social Connections: Not on file  Intimate Partner Violence: Not on file     BP 138/78   Pulse 71   Ht 5\' 6"  (1.676 m)   Wt 212 lb (96.2 kg)   SpO2 98%   BMI 34.22 kg/m   Physical Exam:  Well appearing NAD HEENT: Unremarkable Neck:  No JVD, no thyromegally Lymphatics:  No adenopathy Back:  No CVA tenderness Lungs:  Clear HEART:  Regular rate rhythm, no murmurs, no rubs, no clicks Abd:  soft, positive bowel sounds, no organomegally, no rebound, no guarding Ext:  2 plus pulses, no edema, no cyanosis, no clubbing Skin:  No rashes no nodules Neuro:  CN II through XII intact, motor grossly intact  EKG - nsr with biv pacing  DEVICE  Normal device  function.  See PaceArt for details.   Assess/Plan:  Chronic systolic heart failure -her symptoms are class 2. She will continue her current meds. Biv PPM - her device is working normally but her LV threshold has gone up some. She will undergo watchful waiting. We discussed the treatment options when her device comes up for gen change which would include adding a new lead or extraction and a new lead or continuing the same lead. HTN - her bp is up a bit today. She is encouraged to take her meds and maintain a low sodium diet. Obesity - she will be encouraged to lose weight.    Alexis Gowda Riyaan Heroux,MD

## 2023-06-10 NOTE — Patient Instructions (Signed)
Medication Instructions:  Your physician recommends that you continue on your current medications as directed. Please refer to the Current Medication list given to you today.  *If you need a refill on your cardiac medications before your next appointment, please call your pharmacy*   Lab Work: None ordered   Testing/Procedures: None ordered   Follow-Up: At [facility], you and your health needs are our priority.  As part of our continuing mission to provide you with exceptional heart care, we have created designated Provider Care Teams.  These Care Teams include your primary Cardiologist (physician) and Advanced Practice Providers (APPs -  Physician Assistants and Nurse Practitioners) who all work together to provide you with the care you need, when you need it.  Remote monitoring is used to monitor your Pacemaker or ICD from home. This monitoring reduces the number of office visits required to check your device to one time per year. It allows [place] to keep an eye on the functioning of your device to ensure it is working properly. You are scheduled for a device check from home on 06/22/23. You may send your transmission at any time that day. If you have a wireless device, the transmission will be sent automatically. After your physician reviews your transmission, you will receive a postcard with your next transmission date.  Your next appointment:   March  2025  The format for your next appointment:   In Person  Provider:   Lewayne Bunting, MD{  Thank you for choosing [facility]!!   [phone]

## 2023-06-22 ENCOUNTER — Ambulatory Visit (INDEPENDENT_AMBULATORY_CARE_PROVIDER_SITE_OTHER): Payer: No Typology Code available for payment source

## 2023-06-22 DIAGNOSIS — E785 Hyperlipidemia, unspecified: Secondary | ICD-10-CM | POA: Insufficient documentation

## 2023-06-22 DIAGNOSIS — I495 Sick sinus syndrome: Secondary | ICD-10-CM | POA: Diagnosis not present

## 2023-06-22 DIAGNOSIS — E559 Vitamin D deficiency, unspecified: Secondary | ICD-10-CM | POA: Insufficient documentation

## 2023-06-22 DIAGNOSIS — I509 Heart failure, unspecified: Secondary | ICD-10-CM | POA: Insufficient documentation

## 2023-06-28 LAB — CUP PACEART REMOTE DEVICE CHECK
Battery Remaining Longevity: 5 mo
Battery Remaining Percentage: 7 %
Battery Voltage: 2.8 V
Brady Statistic AP VP Percent: 1.8 %
Brady Statistic AP VS Percent: 1 %
Brady Statistic AS VP Percent: 98 %
Brady Statistic AS VS Percent: 1 %
Brady Statistic RA Percent Paced: 1.6 %
Date Time Interrogation Session: 20240714155056
Implantable Lead Connection Status: 753985
Implantable Lead Connection Status: 753985
Implantable Lead Connection Status: 753985
Implantable Lead Implant Date: 20110113
Implantable Lead Implant Date: 20110113
Implantable Lead Implant Date: 20110113
Implantable Lead Location: 753858
Implantable Lead Location: 753859
Implantable Lead Location: 753860
Implantable Pulse Generator Implant Date: 20180719
Lead Channel Impedance Value: 230 Ohm
Lead Channel Impedance Value: 430 Ohm
Lead Channel Impedance Value: 440 Ohm
Lead Channel Pacing Threshold Amplitude: 0.625 V
Lead Channel Pacing Threshold Amplitude: 1.25 V
Lead Channel Pacing Threshold Amplitude: 2 V
Lead Channel Pacing Threshold Pulse Width: 0.4 ms
Lead Channel Pacing Threshold Pulse Width: 0.6 ms
Lead Channel Pacing Threshold Pulse Width: 0.8 ms
Lead Channel Sensing Intrinsic Amplitude: 12 mV
Lead Channel Sensing Intrinsic Amplitude: 3 mV
Lead Channel Setting Pacing Amplitude: 1.625
Lead Channel Setting Pacing Amplitude: 2 V
Lead Channel Setting Pacing Amplitude: 2.5 V
Lead Channel Setting Pacing Pulse Width: 0.6 ms
Lead Channel Setting Pacing Pulse Width: 0.8 ms
Lead Channel Setting Sensing Sensitivity: 2 mV
Pulse Gen Model: 3222
Pulse Gen Serial Number: 8007164

## 2023-07-01 ENCOUNTER — Encounter: Payer: Self-pay | Admitting: Internal Medicine

## 2023-07-15 NOTE — Progress Notes (Signed)
Remote pacemaker transmission.   

## 2023-07-26 ENCOUNTER — Ambulatory Visit (INDEPENDENT_AMBULATORY_CARE_PROVIDER_SITE_OTHER): Payer: No Typology Code available for payment source

## 2023-07-26 DIAGNOSIS — I495 Sick sinus syndrome: Secondary | ICD-10-CM

## 2023-07-28 LAB — CUP PACEART REMOTE DEVICE CHECK
Battery Remaining Longevity: 5 mo
Battery Remaining Percentage: 7 %
Battery Voltage: 2.81 V
Brady Statistic AP VP Percent: 1.6 %
Brady Statistic AP VS Percent: 1 %
Brady Statistic AS VP Percent: 98 %
Brady Statistic AS VS Percent: 1 %
Brady Statistic RA Percent Paced: 1.4 %
Date Time Interrogation Session: 20240814040343
Implantable Lead Connection Status: 753985
Implantable Lead Connection Status: 753985
Implantable Lead Connection Status: 753985
Implantable Lead Implant Date: 20110113
Implantable Lead Implant Date: 20110113
Implantable Lead Implant Date: 20110113
Implantable Lead Location: 753858
Implantable Lead Location: 753859
Implantable Lead Location: 753860
Implantable Pulse Generator Implant Date: 20180719
Lead Channel Impedance Value: 250 Ohm
Lead Channel Impedance Value: 440 Ohm
Lead Channel Impedance Value: 450 Ohm
Lead Channel Pacing Threshold Amplitude: 0.625 V
Lead Channel Pacing Threshold Amplitude: 1.25 V
Lead Channel Pacing Threshold Amplitude: 2 V
Lead Channel Pacing Threshold Pulse Width: 0.4 ms
Lead Channel Pacing Threshold Pulse Width: 0.6 ms
Lead Channel Pacing Threshold Pulse Width: 0.8 ms
Lead Channel Sensing Intrinsic Amplitude: 12 mV
Lead Channel Sensing Intrinsic Amplitude: 3.9 mV
Lead Channel Setting Pacing Amplitude: 1.625
Lead Channel Setting Pacing Amplitude: 2 V
Lead Channel Setting Pacing Amplitude: 2.5 V
Lead Channel Setting Pacing Pulse Width: 0.6 ms
Lead Channel Setting Pacing Pulse Width: 0.8 ms
Lead Channel Setting Sensing Sensitivity: 2 mV
Pulse Gen Model: 3222
Pulse Gen Serial Number: 8007164

## 2023-08-10 NOTE — Progress Notes (Signed)
Remote pacemaker transmission.   

## 2023-08-10 NOTE — Addendum Note (Signed)
Addended by: Geralyn Flash D on: 08/10/2023 11:13 AM   Modules accepted: Level of Service

## 2023-08-26 ENCOUNTER — Ambulatory Visit (INDEPENDENT_AMBULATORY_CARE_PROVIDER_SITE_OTHER): Payer: No Typology Code available for payment source

## 2023-08-26 DIAGNOSIS — I495 Sick sinus syndrome: Secondary | ICD-10-CM

## 2023-08-30 LAB — CUP PACEART REMOTE DEVICE CHECK
Battery Remaining Longevity: 5 mo
Battery Remaining Percentage: 6 %
Battery Voltage: 2.8 V
Brady Statistic AP VP Percent: 1.4 %
Brady Statistic AP VS Percent: 1 %
Brady Statistic AS VP Percent: 98 %
Brady Statistic AS VS Percent: 1 %
Brady Statistic RA Percent Paced: 1.2 %
Date Time Interrogation Session: 20240914041718
Implantable Lead Connection Status: 753985
Implantable Lead Connection Status: 753985
Implantable Lead Connection Status: 753985
Implantable Lead Implant Date: 20110113
Implantable Lead Implant Date: 20110113
Implantable Lead Implant Date: 20110113
Implantable Lead Location: 753858
Implantable Lead Location: 753859
Implantable Lead Location: 753860
Implantable Pulse Generator Implant Date: 20180719
Lead Channel Impedance Value: 260 Ohm
Lead Channel Impedance Value: 450 Ohm
Lead Channel Impedance Value: 540 Ohm
Lead Channel Pacing Threshold Amplitude: 0.625 V
Lead Channel Pacing Threshold Amplitude: 1.25 V
Lead Channel Pacing Threshold Amplitude: 2 V
Lead Channel Pacing Threshold Pulse Width: 0.4 ms
Lead Channel Pacing Threshold Pulse Width: 0.6 ms
Lead Channel Pacing Threshold Pulse Width: 0.8 ms
Lead Channel Sensing Intrinsic Amplitude: 12 mV
Lead Channel Sensing Intrinsic Amplitude: 3.6 mV
Lead Channel Setting Pacing Amplitude: 1.625
Lead Channel Setting Pacing Amplitude: 2 V
Lead Channel Setting Pacing Amplitude: 2.5 V
Lead Channel Setting Pacing Pulse Width: 0.6 ms
Lead Channel Setting Pacing Pulse Width: 0.8 ms
Lead Channel Setting Sensing Sensitivity: 2 mV
Pulse Gen Model: 3222
Pulse Gen Serial Number: 8007164

## 2023-09-07 NOTE — Addendum Note (Signed)
Addended by: Elease Etienne A on: 09/07/2023 08:20 AM   Modules accepted: Level of Service

## 2023-09-07 NOTE — Progress Notes (Signed)
Remote pacemaker transmission.   

## 2023-09-21 ENCOUNTER — Ambulatory Visit (INDEPENDENT_AMBULATORY_CARE_PROVIDER_SITE_OTHER): Payer: No Typology Code available for payment source

## 2023-09-21 DIAGNOSIS — I495 Sick sinus syndrome: Secondary | ICD-10-CM

## 2023-09-28 LAB — CUP PACEART REMOTE DEVICE CHECK
Battery Remaining Longevity: 3 mo
Battery Remaining Percentage: 5 %
Battery Voltage: 2.77 V
Brady Statistic AP VP Percent: 1.2 %
Brady Statistic AP VS Percent: 1 %
Brady Statistic AS VP Percent: 98 %
Brady Statistic AS VS Percent: 1 %
Brady Statistic RA Percent Paced: 1.1 %
Date Time Interrogation Session: 20241015022923
Implantable Lead Connection Status: 753985
Implantable Lead Connection Status: 753985
Implantable Lead Connection Status: 753985
Implantable Lead Implant Date: 20110113
Implantable Lead Implant Date: 20110113
Implantable Lead Implant Date: 20110113
Implantable Lead Location: 753858
Implantable Lead Location: 753859
Implantable Lead Location: 753860
Implantable Pulse Generator Implant Date: 20180719
Lead Channel Impedance Value: 210 Ohm
Lead Channel Impedance Value: 400 Ohm
Lead Channel Impedance Value: 400 Ohm
Lead Channel Pacing Threshold Amplitude: 0.75 V
Lead Channel Pacing Threshold Amplitude: 1.25 V
Lead Channel Pacing Threshold Amplitude: 2 V
Lead Channel Pacing Threshold Pulse Width: 0.4 ms
Lead Channel Pacing Threshold Pulse Width: 0.6 ms
Lead Channel Pacing Threshold Pulse Width: 0.8 ms
Lead Channel Sensing Intrinsic Amplitude: 12 mV
Lead Channel Sensing Intrinsic Amplitude: 2.2 mV
Lead Channel Setting Pacing Amplitude: 1.75 V
Lead Channel Setting Pacing Amplitude: 2 V
Lead Channel Setting Pacing Amplitude: 2.5 V
Lead Channel Setting Pacing Pulse Width: 0.6 ms
Lead Channel Setting Pacing Pulse Width: 0.8 ms
Lead Channel Setting Sensing Sensitivity: 2 mV
Pulse Gen Model: 3222
Pulse Gen Serial Number: 8007164

## 2023-10-07 NOTE — Progress Notes (Signed)
Remote pacemaker transmission.   

## 2023-10-18 ENCOUNTER — Encounter: Payer: Self-pay | Admitting: Internal Medicine

## 2023-10-25 ENCOUNTER — Ambulatory Visit (INDEPENDENT_AMBULATORY_CARE_PROVIDER_SITE_OTHER): Payer: No Typology Code available for payment source

## 2023-10-25 DIAGNOSIS — I495 Sick sinus syndrome: Secondary | ICD-10-CM

## 2023-10-26 LAB — CUP PACEART REMOTE DEVICE CHECK
Battery Remaining Longevity: 3 mo
Battery Remaining Percentage: 4 %
Battery Voltage: 2.74 V
Brady Statistic AP VP Percent: 1.1 %
Brady Statistic AP VS Percent: 1 %
Brady Statistic AS VP Percent: 98 %
Brady Statistic AS VS Percent: 1 %
Brady Statistic RA Percent Paced: 1 %
Date Time Interrogation Session: 20241112074147
Implantable Lead Connection Status: 753985
Implantable Lead Connection Status: 753985
Implantable Lead Connection Status: 753985
Implantable Lead Implant Date: 20110113
Implantable Lead Implant Date: 20110113
Implantable Lead Implant Date: 20110113
Implantable Lead Location: 753858
Implantable Lead Location: 753859
Implantable Lead Location: 753860
Implantable Pulse Generator Implant Date: 20180719
Lead Channel Impedance Value: 210 Ohm
Lead Channel Impedance Value: 400 Ohm
Lead Channel Impedance Value: 400 Ohm
Lead Channel Pacing Threshold Amplitude: 0.625 V
Lead Channel Pacing Threshold Amplitude: 1.25 V
Lead Channel Pacing Threshold Amplitude: 2 V
Lead Channel Pacing Threshold Pulse Width: 0.4 ms
Lead Channel Pacing Threshold Pulse Width: 0.6 ms
Lead Channel Pacing Threshold Pulse Width: 0.8 ms
Lead Channel Sensing Intrinsic Amplitude: 12 mV
Lead Channel Sensing Intrinsic Amplitude: 2.6 mV
Lead Channel Setting Pacing Amplitude: 1.625
Lead Channel Setting Pacing Amplitude: 2 V
Lead Channel Setting Pacing Amplitude: 2.5 V
Lead Channel Setting Pacing Pulse Width: 0.6 ms
Lead Channel Setting Pacing Pulse Width: 0.8 ms
Lead Channel Setting Sensing Sensitivity: 2 mV
Pulse Gen Model: 3222
Pulse Gen Serial Number: 8007164

## 2023-10-29 ENCOUNTER — Other Ambulatory Visit: Payer: Self-pay | Admitting: Internal Medicine

## 2023-11-19 NOTE — Addendum Note (Signed)
Addended by: Geralyn Flash D on: 11/19/2023 12:14 PM   Modules accepted: Level of Service

## 2023-11-19 NOTE — Progress Notes (Signed)
Remote pacemaker transmission.   

## 2023-11-25 ENCOUNTER — Ambulatory Visit (INDEPENDENT_AMBULATORY_CARE_PROVIDER_SITE_OTHER): Payer: No Typology Code available for payment source

## 2023-11-25 DIAGNOSIS — I495 Sick sinus syndrome: Secondary | ICD-10-CM

## 2023-11-26 LAB — CUP PACEART REMOTE DEVICE CHECK
Battery Remaining Longevity: 1 mo
Battery Remaining Percentage: 3 %
Battery Voltage: 2.71 V
Brady Statistic AP VP Percent: 1 %
Brady Statistic AP VS Percent: 1 %
Brady Statistic AS VP Percent: 98 %
Brady Statistic AS VS Percent: 1 %
Brady Statistic RA Percent Paced: 1 %
Date Time Interrogation Session: 20241212235511
Implantable Lead Connection Status: 753985
Implantable Lead Connection Status: 753985
Implantable Lead Connection Status: 753985
Implantable Lead Implant Date: 20110113
Implantable Lead Implant Date: 20110113
Implantable Lead Implant Date: 20110113
Implantable Lead Location: 753858
Implantable Lead Location: 753859
Implantable Lead Location: 753860
Implantable Pulse Generator Implant Date: 20180719
Lead Channel Impedance Value: 230 Ohm
Lead Channel Impedance Value: 400 Ohm
Lead Channel Impedance Value: 440 Ohm
Lead Channel Pacing Threshold Amplitude: 0.625 V
Lead Channel Pacing Threshold Amplitude: 1.25 V
Lead Channel Pacing Threshold Amplitude: 2 V
Lead Channel Pacing Threshold Pulse Width: 0.4 ms
Lead Channel Pacing Threshold Pulse Width: 0.6 ms
Lead Channel Pacing Threshold Pulse Width: 0.8 ms
Lead Channel Sensing Intrinsic Amplitude: 12 mV
Lead Channel Sensing Intrinsic Amplitude: 4.1 mV
Lead Channel Setting Pacing Amplitude: 1.625
Lead Channel Setting Pacing Amplitude: 2 V
Lead Channel Setting Pacing Amplitude: 2.5 V
Lead Channel Setting Pacing Pulse Width: 0.6 ms
Lead Channel Setting Pacing Pulse Width: 0.8 ms
Lead Channel Setting Sensing Sensitivity: 2 mV
Pulse Gen Model: 3222
Pulse Gen Serial Number: 8007164

## 2023-12-21 ENCOUNTER — Ambulatory Visit (INDEPENDENT_AMBULATORY_CARE_PROVIDER_SITE_OTHER): Payer: No Typology Code available for payment source

## 2023-12-21 DIAGNOSIS — I495 Sick sinus syndrome: Secondary | ICD-10-CM | POA: Diagnosis not present

## 2023-12-21 DIAGNOSIS — I5022 Chronic systolic (congestive) heart failure: Secondary | ICD-10-CM

## 2023-12-22 LAB — CUP PACEART REMOTE DEVICE CHECK
Battery Remaining Longevity: 1 mo
Battery Remaining Percentage: 2 %
Battery Voltage: 2.68 V
Brady Statistic AP VP Percent: 1 %
Brady Statistic AP VS Percent: 1 %
Brady Statistic AS VP Percent: 98 %
Brady Statistic AS VS Percent: 1 %
Brady Statistic RA Percent Paced: 1 %
Date Time Interrogation Session: 20250107020252
Implantable Lead Connection Status: 753985
Implantable Lead Connection Status: 753985
Implantable Lead Connection Status: 753985
Implantable Lead Implant Date: 20110113
Implantable Lead Implant Date: 20110113
Implantable Lead Implant Date: 20110113
Implantable Lead Location: 753858
Implantable Lead Location: 753859
Implantable Lead Location: 753860
Implantable Pulse Generator Implant Date: 20180719
Lead Channel Impedance Value: 260 Ohm
Lead Channel Impedance Value: 430 Ohm
Lead Channel Impedance Value: 440 Ohm
Lead Channel Pacing Threshold Amplitude: 0.75 V
Lead Channel Pacing Threshold Amplitude: 1.25 V
Lead Channel Pacing Threshold Amplitude: 2 V
Lead Channel Pacing Threshold Pulse Width: 0.4 ms
Lead Channel Pacing Threshold Pulse Width: 0.6 ms
Lead Channel Pacing Threshold Pulse Width: 0.8 ms
Lead Channel Sensing Intrinsic Amplitude: 12 mV
Lead Channel Sensing Intrinsic Amplitude: 3.2 mV
Lead Channel Setting Pacing Amplitude: 1.75 V
Lead Channel Setting Pacing Amplitude: 2 V
Lead Channel Setting Pacing Amplitude: 2.5 V
Lead Channel Setting Pacing Pulse Width: 0.6 ms
Lead Channel Setting Pacing Pulse Width: 0.8 ms
Lead Channel Setting Sensing Sensitivity: 2 mV
Pulse Gen Model: 3222
Pulse Gen Serial Number: 8007164

## 2023-12-29 ENCOUNTER — Other Ambulatory Visit: Payer: Self-pay | Admitting: Internal Medicine

## 2023-12-29 MED ORDER — CARVEDILOL 25 MG PO TABS: 25.0000 mg | ORAL_TABLET | Freq: Two times a day (BID) | ORAL | 1 refills | Status: DC

## 2023-12-31 NOTE — Addendum Note (Signed)
Addended by: Elease Etienne A on: 12/31/2023 10:22 AM   Modules accepted: Orders, Level of Service

## 2023-12-31 NOTE — Progress Notes (Signed)
Remote pacemaker transmission.   

## 2024-01-21 ENCOUNTER — Ambulatory Visit (INDEPENDENT_AMBULATORY_CARE_PROVIDER_SITE_OTHER): Payer: No Typology Code available for payment source

## 2024-01-21 DIAGNOSIS — I495 Sick sinus syndrome: Secondary | ICD-10-CM

## 2024-01-22 LAB — CUP PACEART REMOTE DEVICE CHECK
Battery Remaining Longevity: 1 mo
Battery Remaining Percentage: 1 %
Battery Voltage: 2.65 V
Brady Statistic AP VP Percent: 1 %
Brady Statistic AP VS Percent: 1 %
Brady Statistic AS VP Percent: 98 %
Brady Statistic AS VS Percent: 1 %
Brady Statistic RA Percent Paced: 1 %
Date Time Interrogation Session: 20250207041629
Implantable Lead Connection Status: 753985
Implantable Lead Connection Status: 753985
Implantable Lead Connection Status: 753985
Implantable Lead Implant Date: 20110113
Implantable Lead Implant Date: 20110113
Implantable Lead Implant Date: 20110113
Implantable Lead Location: 753858
Implantable Lead Location: 753859
Implantable Lead Location: 753860
Implantable Pulse Generator Implant Date: 20180719
Lead Channel Impedance Value: 230 Ohm
Lead Channel Impedance Value: 400 Ohm
Lead Channel Impedance Value: 410 Ohm
Lead Channel Pacing Threshold Amplitude: 0.625 V
Lead Channel Pacing Threshold Amplitude: 1.25 V
Lead Channel Pacing Threshold Amplitude: 2 V
Lead Channel Pacing Threshold Pulse Width: 0.4 ms
Lead Channel Pacing Threshold Pulse Width: 0.6 ms
Lead Channel Pacing Threshold Pulse Width: 0.8 ms
Lead Channel Sensing Intrinsic Amplitude: 1.7 mV
Lead Channel Sensing Intrinsic Amplitude: 6.1 mV
Lead Channel Setting Pacing Amplitude: 1.625
Lead Channel Setting Pacing Amplitude: 2 V
Lead Channel Setting Pacing Amplitude: 2.5 V
Lead Channel Setting Pacing Pulse Width: 0.6 ms
Lead Channel Setting Pacing Pulse Width: 0.8 ms
Lead Channel Setting Sensing Sensitivity: 2 mV
Pulse Gen Model: 3222
Pulse Gen Serial Number: 8007164

## 2024-02-02 NOTE — Progress Notes (Signed)
 Remote pacemaker transmission.

## 2024-02-02 NOTE — Addendum Note (Signed)
 Addended by: Geralyn Flash D on: 02/02/2024 11:49 AM   Modules accepted: Orders

## 2024-02-21 ENCOUNTER — Ambulatory Visit (INDEPENDENT_AMBULATORY_CARE_PROVIDER_SITE_OTHER): Payer: No Typology Code available for payment source

## 2024-02-21 DIAGNOSIS — I5022 Chronic systolic (congestive) heart failure: Secondary | ICD-10-CM

## 2024-02-21 DIAGNOSIS — I495 Sick sinus syndrome: Secondary | ICD-10-CM

## 2024-02-21 LAB — CUP PACEART REMOTE DEVICE CHECK
Battery Remaining Longevity: 1 mo
Battery Remaining Percentage: 0.5 %
Battery Voltage: 2.62 V
Brady Statistic AP VP Percent: 1 %
Brady Statistic AP VS Percent: 1 %
Brady Statistic AS VP Percent: 99 %
Brady Statistic AS VS Percent: 1 %
Brady Statistic RA Percent Paced: 1 %
Date Time Interrogation Session: 20250310030020
Implantable Lead Connection Status: 753985
Implantable Lead Connection Status: 753985
Implantable Lead Connection Status: 753985
Implantable Lead Implant Date: 20110113
Implantable Lead Implant Date: 20110113
Implantable Lead Implant Date: 20110113
Implantable Lead Location: 753858
Implantable Lead Location: 753859
Implantable Lead Location: 753860
Implantable Pulse Generator Implant Date: 20180719
Lead Channel Impedance Value: 230 Ohm
Lead Channel Impedance Value: 410 Ohm
Lead Channel Impedance Value: 410 Ohm
Lead Channel Pacing Threshold Amplitude: 0.75 V
Lead Channel Pacing Threshold Amplitude: 1.25 V
Lead Channel Pacing Threshold Amplitude: 2 V
Lead Channel Pacing Threshold Pulse Width: 0.4 ms
Lead Channel Pacing Threshold Pulse Width: 0.6 ms
Lead Channel Pacing Threshold Pulse Width: 0.8 ms
Lead Channel Sensing Intrinsic Amplitude: 2.9 mV
Lead Channel Sensing Intrinsic Amplitude: 6.1 mV
Lead Channel Setting Pacing Amplitude: 1.75 V
Lead Channel Setting Pacing Amplitude: 2 V
Lead Channel Setting Pacing Amplitude: 2.5 V
Lead Channel Setting Pacing Pulse Width: 0.6 ms
Lead Channel Setting Pacing Pulse Width: 0.8 ms
Lead Channel Setting Sensing Sensitivity: 2 mV
Pulse Gen Model: 3222
Pulse Gen Serial Number: 8007164

## 2024-02-23 ENCOUNTER — Encounter: Payer: Self-pay | Admitting: Internal Medicine

## 2024-02-23 NOTE — Addendum Note (Signed)
 Addended by: Elease Etienne A on: 02/23/2024 12:34 PM   Modules accepted: Orders, Level of Service

## 2024-02-23 NOTE — Progress Notes (Signed)
 Remote pacemaker transmission.

## 2024-02-28 ENCOUNTER — Telehealth: Payer: Self-pay

## 2024-02-28 NOTE — Telephone Encounter (Signed)
 Alert remote transmission: Deice at [facility]. [facility] reached 02/24/24, routed to clinic for review.  Follow up as scheduled. [facility], [facility]  Appointment is scheduled 03/06/24 w/ Ladona Ridgel, MD. Called and [facility].

## 2024-02-28 NOTE — Telephone Encounter (Signed)
 I let the Alexis Clay know that as of February 24, 2024 she has reached the recommended replacement time. The pacemaker is not all the way dead but we have 3 months to get it replaced. I let her know she has an appointment with Dr. Ladona Ridgel on 03/06/2024 and Dr. Ladona Ridgel will discuss gen change with her and schedule her to get her change out. The Alexis Clay verbalized understanding and thanked me for my help.

## 2024-03-06 ENCOUNTER — Ambulatory Visit: Payer: No Typology Code available for payment source | Attending: Internal Medicine | Admitting: Internal Medicine

## 2024-03-06 ENCOUNTER — Encounter: Payer: Self-pay | Admitting: Internal Medicine

## 2024-03-06 VITALS — BP 110/74 | HR 67 | Ht 65.5 in | Wt 199.0 lb

## 2024-03-06 DIAGNOSIS — Z950 Presence of cardiac pacemaker: Secondary | ICD-10-CM

## 2024-03-06 DIAGNOSIS — Z01812 Encounter for preprocedural laboratory examination: Secondary | ICD-10-CM | POA: Diagnosis not present

## 2024-03-06 DIAGNOSIS — I5022 Chronic systolic (congestive) heart failure: Secondary | ICD-10-CM | POA: Diagnosis not present

## 2024-03-06 LAB — BASIC METABOLIC PANEL
BUN/Creatinine Ratio: 16 (ref 9–23)
BUN: 16 mg/dL (ref 6–24)
CO2: 28 mmol/L (ref 20–29)
Calcium: 9.7 mg/dL (ref 8.7–10.2)
Chloride: 102 mmol/L (ref 96–106)
Creatinine, Ser: 0.97 mg/dL (ref 0.57–1.00)
Glucose: 99 mg/dL (ref 70–99)
Potassium: 4.1 mmol/L (ref 3.5–5.2)
Sodium: 140 mmol/L (ref 134–144)
eGFR: 68 mL/min/{1.73_m2} (ref >59)

## 2024-03-06 NOTE — Patient Instructions (Addendum)
 Medication Instructions:  Your physician recommends that you continue on your current medications as directed. Please refer to the Current Medication list given to you today.  *If you need a refill on your cardiac medications before your next appointment, please call your pharmacy*  Lab Work: Today BMP CBC  You may go to any [facility] Location for your lab work:  [facility] - [address] ([facility]) - [address] [phone] [address]  [place] - [address]   [place]  - [address]   [place] - [address] - [address] [employer]  - [address] [place] - [address] ([facility]   If you have labs (blood work) drawn today and your tests are completely normal, you will receive your results only by: [employer] (if you have MyChart)  If you have any lab test that is abnormal or we need to change your treatment, we will call you or send a MyChart message to review the results.  Testing/Procedures: None ordered.  Follow-Up: At [facility], you and your health needs are our priority.  As part of our continuing mission to provide you with exceptional heart care, we have created designated Provider Care Teams.  These Care Teams include your primary Cardiologist (physician) and Advanced Practice Providers (APPs -  Physician Assistants and Nurse Practitioners) who all work together to provide you with the care you need, when you need it.  Your next appointment:   April 05, 2024  The format for your next appointment:   In Person  Provider:   Lewayne Bunting, MD{or one of the following Advanced Practice Providers on your designated Care Team:   Francis Dowse, PA-C Casimiro Needle "Mardelle Matte" [place], [place] Earnest Rosier, NP  Note: Remote monitoring is used to monitor your Pacemaker/ ICD from home. This monitoring reduces the number of office visits required to check  your device to one time per year. It allows [place] to keep an eye on the functioning of your device to ensure it is working properly.            Valet parking services will be available as well.

## 2024-03-06 NOTE — Progress Notes (Signed)
 HPI Alexis Clay returns today for followup. She is a pleasant 58 yo woman with a h/o HTN, obesity and chronic systolic heart failure, s/p biv PPM insertion. In the interim she has been stable. She denies chest pain and her CHF is class 2A. She denies edema. Her Biv PPM has been in place almost 7 years. She denies syncope. She has not been in the hospital and she has tripped [facility] about 2 weeks ago.  No Known Allergies   Current Outpatient Medications  Medication Sig Dispense Refill   aspirin EC 81 MG tablet Take 81 mg by mouth daily.     carvedilol (COREG) 25 MG tablet Take 1 tablet (25 mg total) by mouth 2 (two) times daily. 180 tablet 1   lisinopril-hydrochlorothiazide (ZESTORETIC) 20-25 MG tablet TAKE 1 TABLET BY MOUTH EVERY DAY 90 tablet 2   No current facility-administered medications for this visit.     Past Medical History:  Diagnosis Date   Anxiety disorder    Biventricular cardiac pacemaker in situ    Bradycardia    Cardiomyopathy (HCC)    CHF (congestive heart failure) (HCC)    Dilated cardiomyopathy (HCC)    Headache    Hypertension, benign    LBBB (left bundle branch block)    Panic disorder without agoraphobia    Presence of permanent cardiac pacemaker    Sinoatrial node dysfunction (HCC)    Sinus node dysfunction (HCC)    Supraventricular premature beats    SVT (supraventricular tachycardia) (HCC)    Systolic heart failure (HCC)    Due to hypertension (LVEF 35-55% after med therapy & resynchronizatoin)    ROS:   All systems reviewed and negative except as noted in the HPI.   Past Surgical History:  Procedure Laterality Date   BIV PACEMAKER GENERATOR CHANGEOUT N/A 07/01/2017   Procedure: BiV Pacemaker Generator Changeout;  Surgeon: Marinus Maw, MD;  Location: [facility] INVASIVE [facility];  Service: Cardiovascular;  Laterality: N/A;   CARDIAC CATHETERIZATION  2011   With widely patent coronary arteries    CESAREAN SECTION  1991   COLONOSCOPY WITH  PROPOFOL N/A 02/04/2016   Procedure: COLONOSCOPY WITH PROPOFOL;  Surgeon: Charna Elizabeth, MD;  Location: [facility];  Service: Endoscopy;  Laterality: N/A;   LEEP  10/2010   PACEMAKER INSERTION  12/26/2009   St. Jude BiV PM.  Model # [id number].  Serial # [id number]     TUBAL LIGATION       Family History  Problem Relation Age of Onset   Cancer Mother        cervical   Heart disease Mother        CABGx3 in 2014   CAD Father    Congestive Heart Failure Father    Hypertension Father    Renal Disease Father        CKD, ESRD   Hypertension Brother    Hypertension Brother    Hypertension Sister      Social History   Socioeconomic History   Marital status: Married    Spouse name: Not on file   Number of children: Not on file   Years of education: Not on file   Highest education level: Not on file  Occupational History   Not on file  Tobacco Use   Smoking status: Never   Smokeless tobacco: Never  Vaping Use   Vaping status: Never Used  Substance and Sexual Activity   Alcohol use: Yes    Comment: occasionally  Drug use: No   Sexual activity: Not on file  Other Topics Concern   Not on file  Social History Narrative   Not on file   Social Drivers of Health   Financial Resource Strain: Low Risk  (06/22/2023)   Received from [facility]   Overall Financial Resource Strain (CARDIA)    Difficulty of Paying Living Expenses: Not hard at all  Food Insecurity: No Food Insecurity (06/22/2023)   Received from [facility]   Hunger Vital Sign    Worried About Running Out of Food in the Last Year: Never true    Ran Out of Food in the Last Year: Never true  Transportation Needs: No Transportation Needs (06/22/2023)   Received from [facility] - Transportation    Lack of Transportation (Medical): No    Lack of Transportation (Non-Medical): No  Physical Activity: Insufficiently Active (06/22/2023)   Received from [facility]   Exercise Vital Sign    Days of Exercise per  Week: 4 days    Minutes of Exercise per Session: 30 min  Stress: Not on file (10/20/2023)  Social Connections: Socially Integrated (06/22/2023)   Received from [facility]   Social Network    How would you rate your social network (family, work, friends)?: Good participation with social networks  Intimate Partner Violence: Not At Risk (06/22/2023)   Received from [facility]   HITS    Over the last 12 months how often did your partner physically hurt you?: Never    Over the last 12 months how often did your partner insult you or talk down to you?: Never    Over the last 12 months how often did your partner threaten you with physical harm?: Never    Over the last 12 months how often did your partner scream or curse at you?: Never     BP 110/74   Pulse 67   Ht 5' 5.5" (1.664 m)   Wt 199 lb (90.3 kg)   SpO2 98%   BMI 32.61 kg/m   Physical Exam:  Well appearing NAD HEENT: Unremarkable Neck:  No JVD, no thyromegally Lymphatics:  No adenopathy Back:  No CVA tenderness Lungs:  Clear with no wheezes HEART:  Regular rate rhythm, no murmurs, no rubs, no clicks Abd:  soft, positive bowel sounds, no organomegally, no rebound, no guarding Ext:  2 plus pulses, no edema, no cyanosis, no clubbing Skin:  No rashes no nodules Neuro:  CN II through XII intact, motor grossly intact  EKG - nSr with biv pacing  DEVICE  Normal device function.  See PaceArt for details.   Assess/Plan:  Chronic systolic heart failure -her symptoms are class 2. She will continue her current meds. Biv PPM - her device is working normally but her LV threshold has gone up some though unchanged from a year ago. She will undergo watchful waiting. We discussed the treatment options. As her threshold has not worsened, I will hold off on adding and/or removing a lead. HTN - her bp is up a bit today. She is encouraged to take her meds and maintain a low sodium diet. Obesity - she will be encouraged to lose weight.     Sharlot Gowda Loan Oguin,MD

## 2024-03-07 LAB — CBC
Hematocrit: 41.6 % (ref 34.0–46.6)
Hemoglobin: 13.6 g/dL (ref 11.1–15.9)
MCH: 29.8 pg (ref 26.6–33.0)
MCHC: 32.7 g/dL (ref 31.5–35.7)
MCV: 91 fL (ref 79–97)
Platelets: 232 10*3/uL (ref 150–450)
RBC: 4.57 x10E6/uL (ref 3.77–5.28)
RDW: 13.5 % (ref 11.7–15.4)
WBC: 4.7 10*3/uL (ref 3.4–10.8)

## 2024-03-15 DIAGNOSIS — Z0279 Encounter for issue of other medical certificate: Secondary | ICD-10-CM

## 2024-03-20 ENCOUNTER — Encounter: Payer: Self-pay | Admitting: Internal Medicine

## 2024-03-21 ENCOUNTER — Other Ambulatory Visit: Payer: Self-pay

## 2024-03-21 DIAGNOSIS — Z01812 Encounter for preprocedural laboratory examination: Secondary | ICD-10-CM

## 2024-03-23 ENCOUNTER — Ambulatory Visit (INDEPENDENT_AMBULATORY_CARE_PROVIDER_SITE_OTHER): Payer: No Typology Code available for payment source

## 2024-03-23 DIAGNOSIS — I495 Sick sinus syndrome: Secondary | ICD-10-CM | POA: Diagnosis not present

## 2024-03-24 LAB — CUP PACEART REMOTE DEVICE CHECK
Battery Remaining Longevity: 0 mo
Battery Voltage: 2.57 V
Brady Statistic AP VP Percent: 1 %
Brady Statistic AP VS Percent: 1 %
Brady Statistic AS VP Percent: 99 %
Brady Statistic AS VS Percent: 1 %
Brady Statistic RA Percent Paced: 1 %
Date Time Interrogation Session: 20250410174826
Implantable Lead Connection Status: 753985
Implantable Lead Connection Status: 753985
Implantable Lead Connection Status: 753985
Implantable Lead Implant Date: 20110113
Implantable Lead Implant Date: 20110113
Implantable Lead Implant Date: 20110113
Implantable Lead Location: 753858
Implantable Lead Location: 753859
Implantable Lead Location: 753860
Implantable Pulse Generator Implant Date: 20180719
Lead Channel Impedance Value: 230 Ohm
Lead Channel Impedance Value: 390 Ohm
Lead Channel Impedance Value: 410 Ohm
Lead Channel Pacing Threshold Amplitude: 0.625 V
Lead Channel Pacing Threshold Amplitude: 1.25 V
Lead Channel Pacing Threshold Amplitude: 2 V
Lead Channel Pacing Threshold Pulse Width: 0.4 ms
Lead Channel Pacing Threshold Pulse Width: 0.6 ms
Lead Channel Pacing Threshold Pulse Width: 0.8 ms
Lead Channel Sensing Intrinsic Amplitude: 12 mV
Lead Channel Sensing Intrinsic Amplitude: 2.8 mV
Lead Channel Setting Pacing Amplitude: 1.625
Lead Channel Setting Pacing Amplitude: 2 V
Lead Channel Setting Pacing Amplitude: 2.5 V
Lead Channel Setting Pacing Pulse Width: 0.6 ms
Lead Channel Setting Pacing Pulse Width: 0.8 ms
Lead Channel Setting Sensing Sensitivity: 2 mV
Pulse Gen Model: 3222
Pulse Gen Serial Number: 8007164

## 2024-03-28 ENCOUNTER — Encounter: Payer: Self-pay | Admitting: Internal Medicine

## 2024-03-31 NOTE — Telephone Encounter (Signed)
 Completed FMLA form is scanned to chart. Patient will be coming in to pick it up. Billing notified.

## 2024-04-03 NOTE — Progress Notes (Signed)
 Remote pacemaker transmission.

## 2024-04-07 NOTE — Telephone Encounter (Signed)
 FMLA paperwork has been placed in Dr. Meridith Stanford box due to moving to [employer] on 04/10/24.

## 2024-04-17 ENCOUNTER — Telehealth (HOSPITAL_COMMUNITY): Payer: Self-pay

## 2024-04-17 DIAGNOSIS — Z01812 Encounter for preprocedural laboratory examination: Secondary | ICD-10-CM

## 2024-04-17 NOTE — Telephone Encounter (Signed)
 Spoke with patient to complete pre-procedure call.     New medical conditions?  No Recent hospitalizations or surgeries? No Started any new medications? No Patient made aware to contact office to inform of any new medications started. Any changes in activities of daily living? No  Pre-procedure testing scheduled: lab work ordered  Confirmed patient is scheduled for PPM generator change on Friday, May 30 with Dr. Manya Sells. Instructed patient to arrive at the Main Entrance A at [facility]: [address] [place], [place] [address] and check in at Admitting at 5:30 AM.  Advised of plan to go home the same day and will only stay overnight if medically necessary. You MUST have a responsible adult to drive you home and MUST be with you the first 24 hours after you arrive home or your procedure could be cancelled.  Patient verbalized understanding to information provided and is agreeable to proceed with procedure.

## 2024-04-24 ENCOUNTER — Ambulatory Visit (INDEPENDENT_AMBULATORY_CARE_PROVIDER_SITE_OTHER): Payer: No Typology Code available for payment source

## 2024-04-24 DIAGNOSIS — I495 Sick sinus syndrome: Secondary | ICD-10-CM

## 2024-04-24 LAB — CUP PACEART REMOTE DEVICE CHECK
Battery Remaining Longevity: 0 mo
Battery Voltage: 2.57 V
Brady Statistic AP VP Percent: 1 %
Brady Statistic AP VS Percent: 1 %
Brady Statistic AS VP Percent: 99 %
Brady Statistic AS VS Percent: 1 %
Brady Statistic RA Percent Paced: 1 %
Date Time Interrogation Session: 20250511220025
Implantable Lead Connection Status: 753985
Implantable Lead Connection Status: 753985
Implantable Lead Connection Status: 753985
Implantable Lead Implant Date: 20110113
Implantable Lead Implant Date: 20110113
Implantable Lead Implant Date: 20110113
Implantable Lead Location: 753858
Implantable Lead Location: 753859
Implantable Lead Location: 753860
Implantable Pulse Generator Implant Date: 20180719
Lead Channel Impedance Value: 260 Ohm
Lead Channel Impedance Value: 410 Ohm
Lead Channel Impedance Value: 430 Ohm
Lead Channel Pacing Threshold Amplitude: 0.75 V
Lead Channel Pacing Threshold Amplitude: 1.25 V
Lead Channel Pacing Threshold Amplitude: 2 V
Lead Channel Pacing Threshold Pulse Width: 0.4 ms
Lead Channel Pacing Threshold Pulse Width: 0.6 ms
Lead Channel Pacing Threshold Pulse Width: 0.8 ms
Lead Channel Sensing Intrinsic Amplitude: 1.2 mV
Lead Channel Sensing Intrinsic Amplitude: 12 mV
Lead Channel Setting Pacing Amplitude: 1.75 V
Lead Channel Setting Pacing Amplitude: 2 V
Lead Channel Setting Pacing Amplitude: 2.5 V
Lead Channel Setting Pacing Pulse Width: 0.6 ms
Lead Channel Setting Pacing Pulse Width: 0.8 ms
Lead Channel Setting Sensing Sensitivity: 2 mV
Pulse Gen Model: 3222
Pulse Gen Serial Number: 8007164

## 2024-04-25 ENCOUNTER — Ambulatory Visit: Payer: Self-pay | Admitting: Internal Medicine

## 2024-04-26 LAB — BASIC METABOLIC PANEL WITH GFR
BUN/Creatinine Ratio: 17 (ref 9–23)
BUN: 16 mg/dL (ref 6–24)
CO2: 26 mmol/L (ref 20–29)
Calcium: 10.2 mg/dL (ref 8.7–10.2)
Chloride: 98 mmol/L (ref 96–106)
Creatinine, Ser: 0.92 mg/dL (ref 0.57–1.00)
Glucose: 104 mg/dL — ABNORMAL HIGH (ref 70–99)
Potassium: 4.4 mmol/L (ref 3.5–5.2)
Sodium: 140 mmol/L (ref 134–144)
eGFR: 72 mL/min/{1.73_m2} (ref >59)

## 2024-04-26 LAB — CBC
Hematocrit: 43.5 % (ref 34.0–46.6)
Hemoglobin: 13.7 g/dL (ref 11.1–15.9)
MCH: 28.9 pg (ref 26.6–33.0)
MCHC: 31.5 g/dL (ref 31.5–35.7)
MCV: 92 fL (ref 79–97)
Platelets: 228 10*3/uL (ref 150–450)
RBC: 4.74 x10E6/uL (ref 3.77–5.28)
RDW: 12.9 % (ref 11.7–15.4)
WBC: 5 10*3/uL (ref 3.4–10.8)

## 2024-05-02 NOTE — Telephone Encounter (Signed)
Addressed in another message pt sent.

## 2024-05-04 DIAGNOSIS — Z9189 Other specified personal risk factors, not elsewhere classified: Secondary | ICD-10-CM | POA: Insufficient documentation

## 2024-05-05 ENCOUNTER — Telehealth (HOSPITAL_COMMUNITY): Payer: Self-pay

## 2024-05-05 ENCOUNTER — Encounter: Payer: Self-pay | Admitting: Family Medicine

## 2024-05-05 ENCOUNTER — Ambulatory Visit (INDEPENDENT_AMBULATORY_CARE_PROVIDER_SITE_OTHER): Admitting: Family Medicine

## 2024-05-05 VITALS — BP 120/72 | HR 65 | Temp 97.9°F | Ht 65.5 in | Wt 204.0 lb

## 2024-05-05 DIAGNOSIS — E7800 Pure hypercholesterolemia, unspecified: Secondary | ICD-10-CM

## 2024-05-05 DIAGNOSIS — Z7185 Encounter for immunization safety counseling: Secondary | ICD-10-CM

## 2024-05-05 DIAGNOSIS — Z0000 Encounter for general adult medical examination without abnormal findings: Secondary | ICD-10-CM | POA: Diagnosis not present

## 2024-05-05 DIAGNOSIS — R7301 Impaired fasting glucose: Secondary | ICD-10-CM

## 2024-05-05 DIAGNOSIS — I10 Essential (primary) hypertension: Secondary | ICD-10-CM

## 2024-05-05 DIAGNOSIS — G4733 Obstructive sleep apnea (adult) (pediatric): Secondary | ICD-10-CM

## 2024-05-05 DIAGNOSIS — I5022 Chronic systolic (congestive) heart failure: Secondary | ICD-10-CM

## 2024-05-05 DIAGNOSIS — Z0001 Encounter for general adult medical examination with abnormal findings: Secondary | ICD-10-CM

## 2024-05-05 LAB — POCT GLYCOSYLATED HEMOGLOBIN (HGB A1C): Hemoglobin A1C: 5.5 % (ref 4.0–5.6)

## 2024-05-05 NOTE — Patient Instructions (Signed)
 Please check with your cardiologist regarding further work up for possible coronary artery disease.  Let them know your last LDL was 187 and your family history.  You may need to start on statin therapy to help lower your risk for heart attack and stroke.   Remember to get your Tdap vaccine here or at your pharmacy.   You are also eligible to get the shingles vaccines.

## 2024-05-05 NOTE — Progress Notes (Signed)
 Complete physical exam  Patient: Alexis Clay   DOB: [birth year]   58 y.o. Female  MRN: [id number]  Subjective:    Chief Complaint  Patient presents with   Annual Exam   She is here for a complete physical exam.  [facility] - Adah Acron, NP She was there yesterday and they plan to restart [facility] after pacemaker replacement surgery next week.   She has past medical history of hypertension, CHF with pacemaker, cardiomyopathy, left bundle branch block, SVT, sinoatrial node dysfunction, panic disorder, headache, vitamin D deficiency, hyperlipidemia, OSA on CPAP.   She has labs from [facility] through her employer  Elevated FBS 105 LDL 187 with normal HDL and trigs   Works at [employer] healthy diet     Health Maintenance  Topic Date Due   HIV Screening  Never done   Hepatitis C Screening  Never done   DTaP/Tdap/Td vaccine (1 - Tdap) Never done   Pneumococcal Vaccination (1 of 2 - PCV) Never done   Zoster (Shingles) Vaccine (1 of 2) Never done   COVID-19 Vaccine (3 - 2024-25 season) 05/21/2024*   Flu Shot  07/14/2024   Mammogram  11/16/2024   Pap with HPV screening  11/18/2025   Colon Cancer Screening  02/03/2026   HPV Vaccine  Aged Out   Meningitis B Vaccine  Aged Out  *Topic was postponed. The date shown is not the original due date.    Wears seatbelt always, uses sunscreen, smoke detectors in home and functioning, does not text while driving, feels safe in home environment.  Depression screening:    05/05/2024   11:07 AM 11/18/2022    3:16 PM  Depression screen PHQ 2/9  Decreased Interest 0 0  Down, Depressed, Hopeless 0 0  PHQ - 2 Score 0 0   Anxiety Screening:     No data to display          Vision:Within last year and Dental: No current dental problems and No regular dental care   Patient Active Problem List   Diagnosis Date Noted   Elevated fasting glucose 05/05/2024   At increased risk for  cardiovascular disease 05/04/2024   Vitamin D deficiency 06/22/2023   Hyperlipidemia 06/22/2023   Dermatitis 11/18/2022   Sinoatrial node dysfunction (HCC)    OSA on CPAP 09/03/2020   Sinus node dysfunction (HCC) 04/02/2020   Heart disease 08/09/2019   Snoring 11/13/2015   Biventricular cardiac pacemaker in situ 08/16/2014   Chronic systolic heart failure (HCC) 08/16/2014   Essential hypertension 08/16/2014   Past Medical History:  Diagnosis Date   Anxiety disorder    Biventricular cardiac pacemaker in situ    Bradycardia    Cardiomyopathy (HCC)    CHF (congestive heart failure) (HCC)    Dilated cardiomyopathy (HCC)    Headache    Hypertension, benign    LBBB (left bundle branch block)    Panic disorder without agoraphobia    Presence of permanent cardiac pacemaker    Sinoatrial node dysfunction (HCC)    Sinus node dysfunction (HCC)    Supraventricular premature beats    SVT (supraventricular tachycardia) (HCC)    Systolic heart failure (HCC)    Due to hypertension (LVEF 35-55% after med therapy & resynchronizatoin)   Past Surgical History:  Procedure Laterality Date   BIV PACEMAKER GENERATOR CHANGEOUT N/A 07/01/2017   Procedure: BiV Pacemaker Generator Changeout;  Surgeon: Tammie Fall, MD;  Location: [facility] INVASIVE  CV LAB;  Service: Cardiovascular;  Laterality: N/A;   CARDIAC CATHETERIZATION  2011   With widely patent coronary arteries    CESAREAN SECTION  1991   COLONOSCOPY WITH PROPOFOL  N/A 02/04/2016   Procedure: COLONOSCOPY WITH PROPOFOL ;  Surgeon: Tami Falcon, MD;  Location: [facility] ENDOSCOPY;  Service: Endoscopy;  Laterality: N/A;   LEEP  10/2010   PACEMAKER INSERTION  12/26/2009   St. Jude BiV PM.  Model # [id number].  Serial # [id number]     TUBAL LIGATION     Social History   Tobacco Use   Smoking status: Never   Smokeless tobacco: Never  Vaping Use   Vaping status: Never Used  Substance Use Topics   Alcohol use: Yes    Comment: occasionally   Drug use: No       Patient Care Team: Abram Abraham, NP-C as PCP - General (Family Medicine) Arty Binning, MD (Inactive) as PCP - Cardiology (Cardiology)   Outpatient Medications Prior to Visit  Medication Sig   aspirin EC 81 MG tablet Take 81 mg by mouth daily.   carvedilol  (COREG ) 25 MG tablet Take 1 tablet (25 mg total) by mouth 2 (two) times daily.   lisinopril -hydrochlorothiazide  (ZESTORETIC ) 20-25 MG tablet TAKE 1 TABLET BY MOUTH EVERY DAY   No facility-administered medications prior to visit.    Review of Systems  Constitutional:  Negative for chills, fever, malaise/fatigue and weight loss.  HENT:  Negative for congestion, ear pain, sinus pain and sore throat.   Eyes:  Negative for blurred vision, double vision and pain.  Respiratory:  Negative for cough, shortness of breath and wheezing.   Cardiovascular:  Negative for chest pain, palpitations and leg swelling.  Gastrointestinal:  Negative for abdominal pain, constipation, diarrhea, nausea and vomiting.  Genitourinary:  Negative for dysuria, frequency and urgency.  Musculoskeletal:  Negative for back pain, joint pain and myalgias.  Skin:  Negative for rash.  Neurological:  Negative for dizziness, focal weakness and headaches.  Endo/Heme/Allergies:  Negative for polydipsia.  Psychiatric/Behavioral:  Negative for depression. The patient is not nervous/anxious.        Objective:     BP 120/72 (BP Location: Left Arm, Patient Position: Sitting)   Pulse 65   Temp 97.9 F (36.6 C) (Temporal)   Ht 5' 5.5" (1.664 m)   Wt 204 lb (92.5 kg)   SpO2 98%   BMI 33.43 kg/m  BP Readings from Last 3 Encounters:  05/05/24 120/72  03/06/24 110/74  06/10/23 138/78   Wt Readings from Last 3 Encounters:  05/05/24 204 lb (92.5 kg)  03/06/24 199 lb (90.3 kg)  06/10/23 212 lb (96.2 kg)    Physical Exam Constitutional:      General: She is not in acute distress.    Appearance: She is not ill-appearing.  HENT:     Right Ear: Tympanic  membrane, ear canal and external ear normal.     Left Ear: Tympanic membrane, ear canal and external ear normal.     Nose: Nose normal.     Mouth/Throat:     Mouth: Mucous membranes are moist.     Pharynx: Oropharynx is clear.  Eyes:     Extraocular Movements: Extraocular movements intact.     Conjunctiva/sclera: Conjunctivae normal.     Pupils: Pupils are equal, round, and reactive to light.  Neck:     Thyroid : No thyroid  mass, thyromegaly or thyroid  tenderness.  Cardiovascular:     Rate and Rhythm: Normal rate and regular  rhythm.     Pulses: Normal pulses.     Heart sounds: Normal heart sounds.  Pulmonary:     Effort: Pulmonary effort is normal.     Breath sounds: Normal breath sounds.  Abdominal:     General: Bowel sounds are normal.     Palpations: Abdomen is soft.     Tenderness: There is no abdominal tenderness. There is no right CVA tenderness, left CVA tenderness, guarding or rebound.  Musculoskeletal:        General: Normal range of motion.     Cervical back: Normal range of motion and neck supple. No tenderness.     Right lower leg: No edema.     Left lower leg: No edema.  Lymphadenopathy:     Cervical: No cervical adenopathy.  Skin:    General: Skin is warm and dry.     Findings: No lesion or rash.  Neurological:     General: No focal deficit present.     Mental Status: She is alert and oriented to person, place, and time.     Cranial Nerves: No cranial nerve deficit.     Sensory: No sensory deficit.     Motor: No weakness.     Gait: Gait normal.  Psychiatric:        Mood and Affect: Mood normal.        Behavior: Behavior normal.        Thought Content: Thought content normal.      Results for orders placed or performed in visit on 05/05/24  POCT HgB A1C  Result Value Ref Range   Hemoglobin A1C 5.5 4.0 - 5.6 %   HbA1c POC (<> result, manual entry)     HbA1c, POC (prediabetic range)     HbA1c, POC (controlled diabetic range)        Assessment & Plan:     Routine Health Maintenance and Physical Exam  Problem List Items Addressed This Visit     Chronic systolic heart failure (HCC)   Elevated fasting glucose   Relevant Orders   POCT HgB A1C (Completed)   Essential hypertension   Hyperlipidemia   OSA on CPAP   Other Visit Diagnoses       Encounter for general adult medical examination with abnormal findings    -  Primary     Immunization counseling          PCOT Hgb A1c 5.5% LDL 187  ASCVD is borderline at 5.1%  Preventive health care reviewed.  Counseling on healthy lifestyle including diet and exercise.  Recommend regular dental and eye exams.  Immunizations reviewed.  Discussed safety. Sees OB/GYN.  Colonoscopy UTD Wears CPAP nightly.  Good compliance with medications.  Is not on statin therapy and will discuss with cardiologist.  Follow up in 6 months.    Return in about 6 months (around 11/05/2024).     Alyson Back, NP-C

## 2024-05-05 NOTE — Telephone Encounter (Signed)
 Spoke with patient to discuss upcoming procedure.   Confirmed patient is scheduled for a PPM generator change on Friday, May 30 with Dr. Manya Sells. Instructed patient to arrive at the Main Entrance A at [facility]: [address] [place], [place] [address] and check in at Admitting at 5:30 AM.   Labs completed  Any recent signs of acute illness or been started on antibiotics? No Any new medications started? No Any medications to hold? Hold ASA for 2 days- last 5/27. Medication instructions:  On the morning of your procedure DO NOT take any medication. No eating or drinking after midnight prior to procedure.   The night before your procedure and the morning of your procedure, wash thoroughly with the CHG surgical soap from the neck down, paying special attention to the area where your procedure will be performed.  Advised of plan to go home the same day and will only stay overnight if medically necessary. You MUST have a responsible adult to drive you home and MUST be with you the first 24 hours after you arrive home.  Patient verbalized understanding to all instructions provided and agreed to proceed with procedure.

## 2024-05-05 NOTE — Progress Notes (Signed)
 Remote pacemaker transmission.

## 2024-05-05 NOTE — Addendum Note (Signed)
 Addended by: Lott Rouleau A on: 05/05/2024 09:53 AM   Modules accepted: Orders

## 2024-05-11 NOTE — Pre-Procedure Instructions (Signed)
 Instructed patient on the following items: Arrival time 0515 Nothing to eat or drink after midnight No meds AM of procedure Responsible person to drive you home and stay with you for 24 hrs  Have you missed any doses of anti-coagulant ASA- held last 2 days.

## 2024-05-12 ENCOUNTER — Other Ambulatory Visit: Payer: Self-pay

## 2024-05-12 ENCOUNTER — Ambulatory Visit (HOSPITAL_COMMUNITY)
Admission: RE | Admit: 2024-05-12 | Discharge: 2024-05-12 | Disposition: A | Discharge status: Home / Self Care | Attending: Internal Medicine | Admitting: Internal Medicine

## 2024-05-12 ENCOUNTER — Ambulatory Visit (HOSPITAL_COMMUNITY)
Admission: RE | Disposition: A | Discharge status: Home / Self Care | Payer: Self-pay | Source: Home / Self Care | Attending: Internal Medicine

## 2024-05-12 DIAGNOSIS — I447 Left bundle-branch block, unspecified: Secondary | ICD-10-CM | POA: Diagnosis not present

## 2024-05-12 DIAGNOSIS — E66811 Obesity, class 1: Secondary | ICD-10-CM | POA: Insufficient documentation

## 2024-05-12 DIAGNOSIS — Z6832 Body mass index (BMI) 32.0-32.9, adult: Secondary | ICD-10-CM | POA: Diagnosis not present

## 2024-05-12 DIAGNOSIS — I110 Hypertensive heart disease with heart failure: Secondary | ICD-10-CM | POA: Insufficient documentation

## 2024-05-12 DIAGNOSIS — I5022 Chronic systolic (congestive) heart failure: Secondary | ICD-10-CM | POA: Diagnosis not present

## 2024-05-12 DIAGNOSIS — Z45010 Encounter for checking and testing of cardiac pacemaker pulse generator [battery]: Secondary | ICD-10-CM | POA: Diagnosis present

## 2024-05-12 HISTORY — PX: BIV PACEMAKER GENERATOR CHANGEOUT: EP1198

## 2024-05-12 SURGERY — BIV PACEMAKER GENERATOR CHANGEOUT

## 2024-05-12 MED ORDER — FENTANYL CITRATE (PF) 100 MCG/2ML IJ SOLN
INTRAMUSCULAR | Status: AC
  Filled 2024-05-12: qty 2

## 2024-05-12 MED ORDER — MIDAZOLAM HCL 5 MG/5ML IJ SOLN
INTRAMUSCULAR | Status: DC | PRN
  Administered 2024-05-12 (×2): 1 mg via INTRAVENOUS

## 2024-05-12 MED ORDER — MIDAZOLAM HCL 2 MG/2ML IJ SOLN
INTRAMUSCULAR | Status: AC
Start: 2024-05-12 — End: ?
  Filled 2024-05-12: qty 2

## 2024-05-12 MED ORDER — LIDOCAINE HCL (PF) 1 % IJ SOLN
INTRAMUSCULAR | Status: AC
  Filled 2024-05-12: qty 30

## 2024-05-12 MED ORDER — ONDANSETRON HCL 4 MG/2ML IJ SOLN: 4.0000 mg | Freq: Four times a day (QID) | INTRAMUSCULAR | Status: DC | PRN

## 2024-05-12 MED ORDER — SODIUM CHLORIDE 0.9 % IV SOLN
80.0000 mg | INTRAVENOUS | Status: AC
  Administered 2024-05-12: 80 mg

## 2024-05-12 MED ORDER — CHLORHEXIDINE GLUCONATE 4 % EX SOLN
4.0000 | Freq: Once | CUTANEOUS | Status: DC
  Filled 2024-05-12: qty 60

## 2024-05-12 MED ORDER — CEFAZOLIN SODIUM-DEXTROSE 2-4 GM/100ML-% IV SOLN
2.0000 g | INTRAVENOUS | Status: AC
  Administered 2024-05-12: 2 g via INTRAVENOUS

## 2024-05-12 MED ORDER — LIDOCAINE HCL (PF) 1 % IJ SOLN
INTRAMUSCULAR | Status: DC | PRN
  Administered 2024-05-12: 60 mL

## 2024-05-12 MED ORDER — GENTAMICIN SULFATE 40 MG/ML IJ SOLN
INTRAMUSCULAR | Status: AC
  Filled 2024-05-12: qty 2

## 2024-05-12 MED ORDER — FENTANYL CITRATE (PF) 100 MCG/2ML IJ SOLN
INTRAMUSCULAR | Status: DC | PRN
  Administered 2024-05-12 (×2): 12.5 ug via INTRAVENOUS

## 2024-05-12 MED ORDER — ACETAMINOPHEN 325 MG PO TABS: 325.0000 mg | ORAL_TABLET | ORAL | Status: DC | PRN

## 2024-05-12 MED ORDER — POVIDONE-IODINE 10 % EX SWAB: 2.0000 | Freq: Once | CUTANEOUS | Status: DC

## 2024-05-12 MED ORDER — SODIUM CHLORIDE 0.9 % IV SOLN: INTRAVENOUS | Status: DC

## 2024-05-12 MED ORDER — CEFAZOLIN SODIUM-DEXTROSE 2-4 GM/100ML-% IV SOLN
INTRAVENOUS | Status: DC
Start: 2024-05-12 — End: 2024-05-12
  Filled 2024-05-12: qty 100

## 2024-05-12 SURGICAL SUPPLY — 7 items
CABLE SURGICAL S-101-97-12 (CABLE) ×1 IMPLANT
PACEMAKER ALLR CRT-P RF PM3222 (Pacemaker) IMPLANT
PAD DEFIB RADIO PHYSIO CONN (PAD) ×1 IMPLANT
POUCH AIGIS-R ANTIBACT PPM (Mesh General) ×1 IMPLANT
POUCH AIGIS-R ANTIBACT PPM MED (Mesh General) IMPLANT
SYR CONTROL 10ML ANGIOGRAPHIC (SYRINGE) IMPLANT
TRAY PACEMAKER INSERTION (PACKS) ×1 IMPLANT

## 2024-05-12 NOTE — Discharge Instructions (Signed)

## 2024-05-12 NOTE — H&P (Signed)
 HPI Alexis Clay returns today for followup. She is a pleasant 58 yo woman with a h/o HTN, obesity and chronic systolic heart failure, s/p biv PPM insertion. In the interim she has been stable. She denies chest pain and her CHF is class 2A. She denies edema. Her Biv PPM has been in place almost 7 years. She denies syncope. She has not been in the hospital and she has tripped ERI about 2 weeks ago.  Allergies  No Known Allergies             Current Outpatient Medications  Medication Sig Dispense Refill   aspirin EC 81 MG tablet Take 81 mg by mouth daily.       carvedilol  (COREG ) 25 MG tablet Take 1 tablet (25 mg total) by mouth 2 (two) times daily. 180 tablet 1   lisinopril -hydrochlorothiazide  (ZESTORETIC ) 20-25 MG tablet TAKE 1 TABLET BY MOUTH EVERY DAY 90 tablet 2      No current facility-administered medications for this visit.              Past Medical History:  Diagnosis Date   Anxiety disorder     Biventricular cardiac pacemaker in situ     Bradycardia     Cardiomyopathy (HCC)     CHF (congestive heart failure) (HCC)     Dilated cardiomyopathy (HCC)     Headache     Hypertension, benign     LBBB (left bundle branch block)     Panic disorder without agoraphobia     Presence of permanent cardiac pacemaker     Sinoatrial node dysfunction (HCC)     Sinus node dysfunction (HCC)     Supraventricular premature beats     SVT (supraventricular tachycardia) (HCC)     Systolic heart failure (HCC)      Due to hypertension (LVEF 35-55% after med therapy & resynchronizatoin)          ROS:    All systems reviewed and negative except as noted in the HPI.          Past Surgical History:  Procedure Laterality Date   BIV PACEMAKER GENERATOR CHANGEOUT N/A 07/01/2017    Procedure: BiV Pacemaker Generator Changeout;  Surgeon: Tammie Fall, MD;  Location: [facility] INVASIVE CV LAB;  Service: Cardiovascular;  Laterality: N/A;   CARDIAC CATHETERIZATION   2011    With  widely patent coronary arteries    CESAREAN SECTION   1991   COLONOSCOPY WITH PROPOFOL  N/A 02/04/2016    Procedure: COLONOSCOPY WITH PROPOFOL ;  Surgeon: Tami Falcon, MD;  Location: [facility];  Service: Endoscopy;  Laterality: N/A;   LEEP   10/2010   PACEMAKER INSERTION   12/26/2009    St. Jude BiV PM.  Model # [id number].  Serial # [id number]     TUBAL LIGATION                     Family History  Problem Relation Age of Onset   Cancer Mother          cervical   Heart disease Mother          CABGx3 in 2014   CAD Father     Congestive Heart Failure Father     Hypertension Father     Renal Disease Father          CKD, ESRD   Hypertension Brother     Hypertension Brother     Hypertension  Sister              Social History         Socioeconomic History   Marital status: Married      Spouse name: Not on file   Number of children: Not on file   Years of education: Not on file   Highest education level: Not on file  Occupational History   Not on file  Tobacco Use   Smoking status: Never   Smokeless tobacco: Never  Vaping Use   Vaping status: Never Used  Substance and Sexual Activity   Alcohol use: Yes      Comment: occasionally   Drug use: No   Sexual activity: Not on file  Other Topics Concern   Not on file  Social History Narrative   Not on file    Social Drivers of Health        Financial Resource Strain: Low Risk  (06/22/2023)    Received from [facility]    Overall Financial Resource Strain (CARDIA)     Difficulty of Paying Living Expenses: Not hard at all  Food Insecurity: No Food Insecurity (06/22/2023)    Received from [facility]    Hunger Vital Sign     Worried About Running Out of Food in the Last Year: Never true     Ran Out of Food in the Last Year: Never true  Transportation Needs: No Transportation Needs (06/22/2023)    Received from [facility] - [facility]     Lack of Transportation (Medical): No     Lack of Transportation  (Non-Medical): No  Physical Activity: Insufficiently Active (06/22/2023)    Received from [facility]    Exercise Vital Sign     Days of Exercise per Week: 4 days     Minutes of Exercise per Session: 30 min  Stress: Not on file (10/20/2023)  Social Connections: Socially Integrated (06/22/2023)    Received from [facility]    Social Network     How would you rate your social network (family, work, friends)?: Good participation with social networks  Intimate Partner Violence: Not At Risk (06/22/2023)    Received from [facility]    HITS     Over the last 12 months how often did your partner physically hurt you?: Never     Over the last 12 months how often did your partner insult you or talk down to you?: Never     Over the last 12 months how often did your partner threaten you with physical harm?: Never     Over the last 12 months how often did your partner scream or curse at you?: Never        BP 110/74   Pulse 67   Ht 5' 5.5" (1.664 m)   Wt 199 lb (90.3 kg)   SpO2 98%   BMI 32.61 kg/m    Physical Exam:   Well appearing NAD HEENT: Unremarkable Neck:  No JVD, no thyromegally Lymphatics:  No adenopathy Back:  No CVA tenderness Lungs:  Clear with no wheezes HEART:  Regular rate rhythm, no murmurs, no rubs, no clicks Abd:  soft, positive bowel sounds, no organomegally, no rebound, no guarding Ext:  2 plus pulses, no edema, no cyanosis, no clubbing Skin:  No rashes no nodules Neuro:  CN II through XII intact, motor grossly intact   EKG - nSr with biv pacing   DEVICE  Normal device function.  See PaceArt  for details.    Assess/Plan:   Chronic systolic heart failure -her symptoms are class 2. She will continue her current meds. Biv PPM - her device is working normally but her LV threshold has gone up some though unchanged from a year ago. She will undergo watchful waiting. We discussed the treatment options. As her threshold has not worsened, I will hold off on adding and/or  removing a lead. HTN - her bp is up a bit today. She is encouraged to take her meds and maintain a low sodium diet. Obesity - she will be encouraged to lose weight.    Pete Brand Arshad Oberholzer,MD

## 2024-05-12 NOTE — Progress Notes (Signed)
 Patient and husband was given discharge instructions. Both verbalized understanding.

## 2024-05-13 ENCOUNTER — Encounter (HOSPITAL_COMMUNITY): Payer: Self-pay | Admitting: Internal Medicine

## 2024-05-15 ENCOUNTER — Telehealth: Payer: Self-pay | Admitting: Internal Medicine

## 2024-05-15 MED FILL — Midazolam HCl Inj PF 2 MG/2ML (Base Equivalent): INTRAMUSCULAR | Qty: 1 | Status: AC

## 2024-05-15 NOTE — Telephone Encounter (Signed)
 Follow-up after same day discharge: Implant date: 05/12/24 MD: Manya Sells, MD Device: Abbott CRT-P  Location: Left chest   Wound check visit: Thursday 05/25/24 at 9:20 am 90 day MD follow-up: Tuesday 08/15/24 at 1:30 pm  Remote Transmission received: Post gen change transmission not received at this time. I have asked the patient to send a manuel transmission when she is back home. The patient advised she should be able to send a transmission after 3:30 pm today.   Dressing/sling removed: Confirmed with the patient she is currently not wearing a sling. She has not taken her pressure dressing off but will do so this afternoon.   Confirm OAC restart on: Patient is on ASA. She has not restarted this post procedure per her own discretion, but will resume.   Please continue to monitor your cardiac device site for redness, swelling, and drainage. Call the device clinic at [phone] if you experience these symptoms, fever/chills, or have questions about your device.   Remote monitoring is used to monitor your cardiac device from home. This monitoring is scheduled every 91 days by our office. It allows us  to keep an eye on the functioning of your device to ensure it is working properly.

## 2024-05-16 NOTE — Telephone Encounter (Signed)
 Reviewed PaceArt- verified that the patient's remote transmission for activation was received on 05/15/24.

## 2024-05-17 ENCOUNTER — Encounter: Payer: Self-pay | Admitting: Emergency Medicine

## 2024-05-25 ENCOUNTER — Ambulatory Visit: Attending: Cardiology

## 2024-05-25 DIAGNOSIS — I5022 Chronic systolic (congestive) heart failure: Secondary | ICD-10-CM

## 2024-05-25 NOTE — Patient Instructions (Signed)

## 2024-05-25 NOTE — Progress Notes (Signed)
 Normal CRT-P chamber pacemaker wound check. Presenting rhythm: AS/VS 81 . Wound well healed. Edges approximated no sign of infection. There is noted minimum swelling at device site. Pt notes swelling is improving since implant. Pt on baby ASA only, no OAC. Small amount of superficial redness d/t irritation from glue and bandage with minimal itching. Instructed patient to apply cool compresses and take benadryl as needed. She will monitor area for signs of increased swelling and irritation and let us  know if area does not heal. Direct device clinic number given.   Routine testing performed. Thresholds, sensing, and impedances consistent with implant chronic lead measurements and at 3.5V safety margin/auto capture until 3 month visit. No episodes.  Pt enrolled in remote follow-up.

## 2024-06-12 NOTE — Progress Notes (Signed)
 Remote pacemaker transmission.

## 2024-06-24 ENCOUNTER — Other Ambulatory Visit: Payer: Self-pay | Admitting: Internal Medicine

## 2024-06-26 ENCOUNTER — Encounter

## 2024-06-26 ENCOUNTER — Ambulatory Visit: Payer: Self-pay | Admitting: Internal Medicine

## 2024-06-26 ENCOUNTER — Ambulatory Visit

## 2024-06-26 DIAGNOSIS — I495 Sick sinus syndrome: Secondary | ICD-10-CM

## 2024-06-26 LAB — CUP PACEART REMOTE DEVICE CHECK
Battery Remaining Longevity: 48 mo
Battery Remaining Percentage: 94 %
Battery Voltage: 2.99 V
Brady Statistic AP VP Percent: 23 %
Brady Statistic AP VS Percent: 1.6 %
Brady Statistic AS VP Percent: 45 %
Brady Statistic AS VS Percent: 30 %
Brady Statistic RA Percent Paced: 25 %
Date Time Interrogation Session: 20250714021833
Implantable Lead Connection Status: 753985
Implantable Lead Connection Status: 753985
Implantable Lead Connection Status: 753985
Implantable Lead Implant Date: 20110113
Implantable Lead Implant Date: 20110113
Implantable Lead Implant Date: 20110113
Implantable Lead Location: 753858
Implantable Lead Location: 753859
Implantable Lead Location: 753860
Implantable Pulse Generator Implant Date: 20250530
Lead Channel Impedance Value: 240 Ohm
Lead Channel Impedance Value: 400 Ohm
Lead Channel Impedance Value: 440 Ohm
Lead Channel Pacing Threshold Amplitude: 0.75 V
Lead Channel Pacing Threshold Amplitude: 1.125 V
Lead Channel Pacing Threshold Amplitude: 1.375 V
Lead Channel Pacing Threshold Pulse Width: 0.4 ms
Lead Channel Pacing Threshold Pulse Width: 0.5 ms
Lead Channel Pacing Threshold Pulse Width: 1 ms
Lead Channel Sensing Intrinsic Amplitude: 12 mV
Lead Channel Sensing Intrinsic Amplitude: 3 mV
Lead Channel Setting Pacing Amplitude: 2.125
Lead Channel Setting Pacing Amplitude: 2.375
Lead Channel Setting Pacing Amplitude: 2.5 V
Lead Channel Setting Pacing Pulse Width: 0.5 ms
Lead Channel Setting Pacing Pulse Width: 1 ms
Lead Channel Setting Sensing Sensitivity: 2 mV
Pulse Gen Model: 3222
Pulse Gen Serial Number: 8185260

## 2024-07-27 ENCOUNTER — Encounter

## 2024-08-08 ENCOUNTER — Other Ambulatory Visit: Payer: Self-pay | Admitting: Internal Medicine

## 2024-08-09 MED ORDER — LISINOPRIL-HYDROCHLOROTHIAZIDE 20-25 MG PO TABS: 1.0000 | ORAL_TABLET | Freq: Every day | ORAL | 2 refills | Status: AC

## 2024-08-14 NOTE — Progress Notes (Unsigned)
  Cardiology Office Note:  .   Date:  08/14/2024  ID:  Emalea Mix, DOB 1966/11/03, MRN [id number] PCP: Lendia Boby CROME, NP-C  [facility] Providers Cardiologist:  Victory LELON Claudene DOUGLAS, MD (Inactive) {  History of Present Illness: .   Iyari Hagner is a 58 y.o. female w/PMHx of  HTN LBBB, hypertensive heart disease, CM SVT, SN dysfunction  Saw dr. Pryor 03/06/24, described class II symptoms, device had reached ERI Mentioned LV Threshold had risen over time, but stable from the year prior >> planned to monitor this  Gen change 05/12/24  Today's visit is scheduled as her 90 day post gen change visit ROS:   No gen cards, very remtely dr. Claudene *** chronic outputs *** volume *** symptoms   Device information SJM CRT-P, implanted 12/26/09, gen change 07/02/2027, and 05/12/24   Studies Reviewed: SABRA    EKG not done today  DEVICE interrogation done today and reviewed by myself *** Battery and lead measurements are good ***   06/29/2019: TTE 1. The left ventricle has hyperdynamic systolic function, with an  ejection fraction of >65%. The cavity size was normal. There is moderately  increased left ventricular wall thickness. Left ventricular diastolic  parameters were normal.   2. The right ventricle has normal systolic function.   3. Trivial pericardial effusion is present.    Risk Assessment/Calculations:    Physical Exam:   VS:  There were no vitals taken for this visit.   Wt Readings from Last 3 Encounters:  05/12/24 204 lb (92.5 kg)  05/05/24 204 lb (92.5 kg)  03/06/24 199 lb (90.3 kg)    GEN: Well nourished, well developed in no acute distress NECK: No JVD; No carotid bruits CARDIAC: ***RRR, no murmurs, rubs, gallops RESPIRATORY:  *** CTA b/l without rales, wheezing or rhonchi  ABDOMEN: Soft, non-tender, non-distended EXTREMITIES: *** No edema; No deformity   ICD site: *** is stable, no thinning, fluctuation, tethering  ASSESSMENT AND PLAN: .     CRT-D *** intact function *** no programming changes made  HTN Hypertensive heart disease CM LVEF recovered by last echo in 2020 ***      {Are you ordering a CV Procedure (e.g. stress test, cath, DCCV, TEE, etc)?   Press F2        :[id number]}     Dispo: ***  Signed, Charlies Macario Arthur, PA-C

## 2024-08-15 ENCOUNTER — Ambulatory Visit: Attending: Physician Assistant | Admitting: Physician Assistant

## 2024-08-15 ENCOUNTER — Encounter: Payer: Self-pay | Admitting: Physician Assistant

## 2024-08-15 VITALS — BP 100/62 | HR 95 | Ht 66.0 in | Wt 209.0 lb

## 2024-08-15 DIAGNOSIS — I10 Essential (primary) hypertension: Secondary | ICD-10-CM

## 2024-08-15 DIAGNOSIS — Z950 Presence of cardiac pacemaker: Secondary | ICD-10-CM

## 2024-08-15 DIAGNOSIS — I429 Cardiomyopathy, unspecified: Secondary | ICD-10-CM | POA: Diagnosis not present

## 2024-08-15 LAB — CUP PACEART INCLINIC DEVICE CHECK
Battery Remaining Longevity: 51 mo
Battery Voltage: 2.98 V
Brady Statistic RA Percent Paced: 25 %
Brady Statistic RV Percent Paced: 63 %
Date Time Interrogation Session: 20250902172558
Implantable Lead Connection Status: 753985
Implantable Lead Connection Status: 753985
Implantable Lead Connection Status: 753985
Implantable Lead Implant Date: 20110113
Implantable Lead Implant Date: 20110113
Implantable Lead Implant Date: 20110113
Implantable Lead Location: 753858
Implantable Lead Location: 753859
Implantable Lead Location: 753860
Implantable Pulse Generator Implant Date: 20250530
Lead Channel Impedance Value: 250 Ohm
Lead Channel Impedance Value: 437.5 Ohm
Lead Channel Impedance Value: 462.5 Ohm
Lead Channel Pacing Threshold Amplitude: 0.75 V
Lead Channel Pacing Threshold Amplitude: 0.75 V
Lead Channel Pacing Threshold Amplitude: 1.25 V
Lead Channel Pacing Threshold Amplitude: 1.625 V
Lead Channel Pacing Threshold Pulse Width: 0.4 ms
Lead Channel Pacing Threshold Pulse Width: 0.4 ms
Lead Channel Pacing Threshold Pulse Width: 0.5 ms
Lead Channel Pacing Threshold Pulse Width: 1 ms
Lead Channel Sensing Intrinsic Amplitude: 12 mV
Lead Channel Sensing Intrinsic Amplitude: 2.8 mV
Lead Channel Setting Pacing Amplitude: 2.25 V
Lead Channel Setting Pacing Amplitude: 2.5 V
Lead Channel Setting Pacing Amplitude: 2.625
Lead Channel Setting Pacing Pulse Width: 0.5 ms
Lead Channel Setting Pacing Pulse Width: 1 ms
Lead Channel Setting Sensing Sensitivity: 2 mV
Pulse Gen Model: 3222
Pulse Gen Serial Number: 8185260

## 2024-08-15 NOTE — Patient Instructions (Addendum)
 Medication Instructions:    Your physician recommends that you continue on your current medications as directed. Please refer to the Current Medication list given to you today.   *If you need a refill on your cardiac medications before your next appointment, please call your pharmacy*   Lab Work: NONE ORDERED  TODAY    If you have labs (blood work) drawn today and your tests are completely normal, you will receive your results only by: MyChart Message (if you have MyChart) OR A paper copy in the mail If you have any lab test that is abnormal or we need to change your treatment, we will call you to review the results.    Testing/Procedures: NONE ORDERED  TODAY    Follow-Up: At [facility], you and your health needs are our priority.  As part of our continuing mission to provide you with exceptional heart care, our providers are all part of one team.  This team includes your primary Cardiologist (physician) and Advanced Practice Providers or APPs (Physician Assistants and Nurse Practitioners) who all work together to provide you with the care you need, when you need it.   Your next appointment:  NEXT AVAILABLE DR TURNER  FOR PRIMARY CARDIOLOGY  AND SLEEP     Provider:   RENEE  IN 2 MONTHS ( CONTACT  CASSIE HALL/ ANGELINE HAMMER FOR EP SCHEDULING ISSUES )    We recommend signing up for the patient portal called MyChart.  Sign up information is provided on this After Visit Summary.  MyChart is used to connect with patients for Virtual Visits (Telemedicine).  Patients are able to view lab/test results, encounter notes, upcoming appointments, etc.  Non-urgent messages can be sent to your provider as well.   To learn more about what you can do with MyChart, go to [email or web address].   Other Instructions

## 2024-08-28 ENCOUNTER — Encounter

## 2024-09-21 NOTE — Progress Notes (Signed)
 Remote PPM Transmission

## 2024-09-25 ENCOUNTER — Ambulatory Visit (INDEPENDENT_AMBULATORY_CARE_PROVIDER_SITE_OTHER)

## 2024-09-25 ENCOUNTER — Ambulatory Visit

## 2024-09-25 ENCOUNTER — Ambulatory Visit (HOSPITAL_BASED_OUTPATIENT_CLINIC_OR_DEPARTMENT_OTHER): Admitting: Student

## 2024-09-25 DIAGNOSIS — S86001A Unspecified injury of right Achilles tendon, initial encounter: Secondary | ICD-10-CM | POA: Diagnosis not present

## 2024-09-25 DIAGNOSIS — M25571 Pain in right ankle and joints of right foot: Secondary | ICD-10-CM | POA: Diagnosis not present

## 2024-09-25 DIAGNOSIS — I495 Sick sinus syndrome: Secondary | ICD-10-CM | POA: Diagnosis not present

## 2024-09-25 LAB — CUP PACEART REMOTE DEVICE CHECK
Battery Remaining Longevity: 47 mo
Battery Remaining Percentage: 89 %
Battery Voltage: 2.95 V
Brady Statistic AP VP Percent: 2.2 %
Brady Statistic AP VS Percent: 1 %
Brady Statistic AS VP Percent: 95 %
Brady Statistic AS VS Percent: 1.2 %
Brady Statistic RA Percent Paced: 1.3 %
Date Time Interrogation Session: 20251013030335
Implantable Lead Connection Status: 753985
Implantable Lead Connection Status: 753985
Implantable Lead Connection Status: 753985
Implantable Lead Implant Date: 20110113
Implantable Lead Implant Date: 20110113
Implantable Lead Implant Date: 20110113
Implantable Lead Location: 753858
Implantable Lead Location: 753859
Implantable Lead Location: 753860
Implantable Pulse Generator Implant Date: 20250530
Lead Channel Impedance Value: 250 Ohm
Lead Channel Impedance Value: 430 Ohm
Lead Channel Impedance Value: 440 Ohm
Lead Channel Pacing Threshold Amplitude: 0.75 V
Lead Channel Pacing Threshold Amplitude: 1.5 V
Lead Channel Pacing Threshold Amplitude: 2.25 V
Lead Channel Pacing Threshold Pulse Width: 0.4 ms
Lead Channel Pacing Threshold Pulse Width: 0.5 ms
Lead Channel Pacing Threshold Pulse Width: 1 ms
Lead Channel Sensing Intrinsic Amplitude: 12 mV
Lead Channel Sensing Intrinsic Amplitude: 2.6 mV
Lead Channel Setting Pacing Amplitude: 2.5 V
Lead Channel Setting Pacing Amplitude: 2.5 V
Lead Channel Setting Pacing Amplitude: 3.25 V
Lead Channel Setting Pacing Pulse Width: 0.5 ms
Lead Channel Setting Pacing Pulse Width: 1 ms
Lead Channel Setting Sensing Sensitivity: 2 mV
Pulse Gen Model: 3222
Pulse Gen Serial Number: 8185260

## 2024-09-25 NOTE — Progress Notes (Signed)
 Chief Complaint: Right ankle injury    Discussed the use of AI scribe software for clinical note transcription with the patient, who gave verbal consent to proceed.  History of Present Illness Alexis Clay is a 58 year old female who presents with ankle pain following an injury sustained while dancing. She experienced ankle pain after kicking her leg up and landing during a dance performance. Numbness was initially felt in the back of her leg. X-rays at an urgent care facility were taken but she does not have access to these. The pain is localized to the back of the ankle, particularly the Achilles region, and is exacerbated by plantar flexion. Mild bruising appeared on the second day post-injury. There is no current numbness or radiating pain in the foot. She takes meloxicam  15 mg daily for pain management.  She is traveling this weekend on a weeklong trip to [facility].   Surgical History:   None  PMH/PSH/Family History/Social History/Meds/Allergies:    Past Medical History:  Diagnosis Date   Anxiety disorder    Biventricular cardiac pacemaker in situ    Bradycardia    Cardiomyopathy (HCC)    CHF (congestive heart failure) (HCC)    Dilated cardiomyopathy (HCC)    Headache    Hypertension, benign    LBBB (left bundle branch block)    Panic disorder without agoraphobia    Presence of permanent cardiac pacemaker    Sinoatrial node dysfunction (HCC)    Sinus node dysfunction (HCC)    Supraventricular premature beats    SVT (supraventricular tachycardia)    Systolic heart failure (HCC)    Due to hypertension (LVEF 35-55% after med therapy & resynchronizatoin)   Past Surgical History:  Procedure Laterality Date   BIV PACEMAKER GENERATOR CHANGEOUT N/A 07/01/2017   Procedure: BiV Pacemaker Generator Changeout;  Surgeon: Waddell Danelle ORN, MD;  Location: [facility] INVASIVE CV LAB;  Service: Cardiovascular;  Laterality: N/A;   BIV PACEMAKER  GENERATOR CHANGEOUT N/A 05/12/2024   Procedure: BIV PACEMAKER GENERATOR CHANGEOUT;  Surgeon: Waddell Danelle ORN, MD;  Location: [facility] INVASIVE CV LAB;  Service: Cardiovascular;  Laterality: N/A;   CARDIAC CATHETERIZATION  2011   With widely patent coronary arteries    CESAREAN SECTION  1991   COLONOSCOPY WITH PROPOFOL  N/A 02/04/2016   Procedure: COLONOSCOPY WITH PROPOFOL ;  Surgeon: Renaye Sous, MD;  Location: [facility] ENDOSCOPY;  Service: Endoscopy;  Laterality: N/A;   LEEP  10/2010   PACEMAKER INSERTION  12/26/2009   St. Jude BiV PM.  Model # [id number].  Serial # [id number]     TUBAL LIGATION     Social History   Socioeconomic History   Marital status: Married    Spouse name: Not on file   Number of children: Not on file   Years of education: Not on file   Highest education level: Not on file  Occupational History   Not on file  Tobacco Use   Smoking status: Never   Smokeless tobacco: Never  Vaping Use   Vaping status: Never Used  Substance and Sexual Activity   Alcohol use: Yes    Comment: occasionally   Drug use: No   Sexual activity: Not on file  Other Topics Concern   Not on file  Social History Narrative   Not on file   Social Drivers  of Health   Financial Resource Strain: Patient Declined (05/04/2024)   Received from [facility]   Overall Financial Resource Strain (CARDIA)    Difficulty of Paying Living Expenses: Patient declined  Food Insecurity: Patient Declined (05/04/2024)   Received from [facility]   Hunger Vital Sign    Within the past 12 months, you worried that your food would run out before you got the money to buy more.: Patient declined    Within the past 12 months, the food you bought just didn't last and you didn't have money to get more.: Patient declined  Transportation Needs: Patient Declined (05/04/2024)   Received from [facility] - [facility]    Lack of Transportation (Medical): Patient declined    Lack of Transportation (Non-Medical):  Patient declined  Physical Activity: Insufficiently Active (06/22/2023)   Received from [facility]   Exercise Vital Sign    On average, how many days per week do you engage in moderate to strenuous exercise (like a brisk walk)?: 4 days    On average, how many minutes do you engage in exercise at this level?: 30 min  Stress: Not on file (10/20/2023)  Social Connections: Socially Integrated (06/22/2023)   Received from [facility]   Social Network    How would you rate your social network (family, work, friends)?: Good participation with social networks   Family History  Problem Relation Age of Onset   Cancer Mother        cervical   Heart disease Mother        CABGx3 in 2014   CAD Father    Congestive Heart Failure Father    Hypertension Father    Renal Disease Father        CKD, ESRD   Hypertension Brother    Hypertension Brother    Hypertension Sister    No Known Allergies Current Outpatient Medications  Medication Sig Dispense Refill   aspirin EC 81 MG tablet Take 81 mg by mouth daily.     carvedilol  (COREG ) 25 MG tablet TAKE 1 TABLET BY MOUTH TWICE A DAY 180 tablet 2   lisinopril -hydrochlorothiazide  (ZESTORETIC ) 20-25 MG tablet Take 1 tablet by mouth daily. 90 tablet 2   No current facility-administered medications for this visit.   No results found.  Review of Systems:   A ROS was performed including pertinent positives and negatives as documented in the HPI.  Physical Exam :   Constitutional: NAD and appears stated age Neurological: Alert and oriented Psych: Appropriate affect and cooperative There were no vitals taken for this visit.   Comprehensive Musculoskeletal Exam:    Exam of the right ankle demonstrates mild to moderate soft tissue edema throughout.  No medial or lateral malleoli tenderness.  There is tenderness over the distal Achilles tendon with pain and slight weakness noted in dorsiflexion.  Positive Thompson test.  DP pulse 2+.  Imaging:   Xray  (right ankle 3 views): Negative for acute fracture or dislocation.  There is a posterior calcification in the soft tissues above the level of the ankle mortise.   I personally reviewed and interpreted the radiographs.      Assessment & Plan Right Achilles tendon injury with associated right ankle pain   There is a suspected tear of the right Achilles tendon with associated ankle pain. X-rays showed no significant bony injury. An MRI is needed to confirm the extent of the injury. Nonoperative management is preferred unless the MRI indicates otherwise. Order an  MRI of the right ankle to assess the extent of the injury. Continue wearing the boot to protect the tendon and use a heel lift in the boot to reduce tension. Limit weight bearing on the right foot and use crutches for support. Continue meloxicam  once daily for pain management and avoid taking Aleve or ibuprofen while on meloxicam . Elevate the leg during travel and move the ankle gently to maintain circulation.       I personally saw and evaluated the patient, and participated in the management and treatment plan.  Leonce Reveal, PA-C Orthopedics

## 2024-09-26 ENCOUNTER — Encounter (HOSPITAL_BASED_OUTPATIENT_CLINIC_OR_DEPARTMENT_OTHER): Payer: Self-pay

## 2024-09-26 ENCOUNTER — Telehealth (HOSPITAL_BASED_OUTPATIENT_CLINIC_OR_DEPARTMENT_OTHER): Payer: Self-pay | Admitting: Student

## 2024-09-26 NOTE — Telephone Encounter (Signed)
 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

## 2024-09-26 NOTE — Telephone Encounter (Signed)
 SABRA

## 2024-09-27 NOTE — Progress Notes (Signed)
 Remote PPM Transmission

## 2024-09-27 NOTE — Telephone Encounter (Signed)
 Changed the location to the hospital

## 2024-09-28 ENCOUNTER — Encounter

## 2024-09-28 ENCOUNTER — Ambulatory Visit: Payer: Self-pay | Admitting: Internal Medicine

## 2024-10-11 ENCOUNTER — Ambulatory Visit (INDEPENDENT_AMBULATORY_CARE_PROVIDER_SITE_OTHER): Admitting: Student

## 2024-10-11 DIAGNOSIS — S86001A Unspecified injury of right Achilles tendon, initial encounter: Secondary | ICD-10-CM | POA: Diagnosis not present

## 2024-10-11 NOTE — Progress Notes (Signed)
 Chief Complaint: Right ankle injury    History of Present Illness  10/11/24: Patient presents today for follow-up evaluation of her right ankle injury.  Patient reports that she does continue to have some swelling and discomfort in the back of the right ankle and lower leg.  She has been utilizing the cam boot with a heel lift but has just recently began trying to get back into normal shoes.  An MRI had been ordered to the right ankle for further assessment although was not able to to be completed due to one of her pacemaker leads not being compatible.   09/25/24: Alexis Clay is a 58 year old female who presents with ankle pain following an injury sustained while dancing. She experienced ankle pain after kicking her leg up and landing during a dance performance. Numbness was initially felt in the back of her leg. X-rays at an urgent care facility were taken but she does not have access to these. The pain is localized to the back of the ankle, particularly the Achilles region, and is exacerbated by plantar flexion. Mild bruising appeared on the second day post-injury. There is no current numbness or radiating pain in the foot. She takes meloxicam  15 mg daily for pain management.  She is traveling this weekend on a weeklong trip to [facility]   Surgical History:   None  PMH/PSH/Family History/Social History/Meds/Allergies:    Past Medical History:  Diagnosis Date   Anxiety disorder    Biventricular cardiac pacemaker in situ    Bradycardia    Cardiomyopathy (HCC)    CHF (congestive heart failure) (HCC)    Dilated cardiomyopathy (HCC)    Headache    Hypertension, benign    LBBB (left bundle branch block)    Panic disorder without agoraphobia    Presence of permanent cardiac pacemaker    Sinoatrial node dysfunction (HCC)    Sinus node dysfunction (HCC)    Supraventricular premature beats    SVT (supraventricular tachycardia)    Systolic  heart failure (HCC)    Due to hypertension (LVEF 35-55% after med therapy & resynchronizatoin)   Past Surgical History:  Procedure Laterality Date   BIV PACEMAKER GENERATOR CHANGEOUT N/A 07/01/2017   Procedure: BiV Pacemaker Generator Changeout;  Surgeon: Waddell Danelle ORN, MD;  Location: [facility] INVASIVE CV LAB;  Service: Cardiovascular;  Laterality: N/A;   BIV PACEMAKER GENERATOR CHANGEOUT N/A 05/12/2024   Procedure: BIV PACEMAKER GENERATOR CHANGEOUT;  Surgeon: Waddell Danelle ORN, MD;  Location: [facility] INVASIVE CV LAB;  Service: Cardiovascular;  Laterality: N/A;   CARDIAC CATHETERIZATION  2011   With widely patent coronary arteries    CESAREAN SECTION  1991   COLONOSCOPY WITH PROPOFOL  N/A 02/04/2016   Procedure: COLONOSCOPY WITH PROPOFOL ;  Surgeon: Renaye Sous, MD;  Location: [facility] ENDOSCOPY;  Service: Endoscopy;  Laterality: N/A;   LEEP  10/2010   PACEMAKER INSERTION  12/26/2009   [employer]. Jude BiV PM.  Model # [id number].  Serial # [id number]     TUBAL LIGATION     Social History   Socioeconomic History   Marital status: Married    Spouse name: Not on file   Number of children: Not on file   Years of education: Not on file   Highest education level: Not on file  Occupational History   Not  on file  Tobacco Use   Smoking status: Never   Smokeless tobacco: Never  Vaping Use   Vaping status: Never Used  Substance and Sexual Activity   Alcohol use: Yes    Comment: occasionally   Drug use: No   Sexual activity: Not on file  Other Topics Concern   Not on file  Social History Narrative   Not on file   Social Drivers of Health   Financial Resource Strain: Patient Declined (05/04/2024)   Received from [facility]   Overall Financial Resource Strain (CARDIA)    Difficulty of Paying Living Expenses: Patient declined  Food Insecurity: Patient Declined (05/04/2024)   Received [facility]   Hunger Vital Sign    Within the past 12 months, you worried that your food would run out before you got the  money to buy more.: Patient declined    Within the past 12 months, the food you bought just didn't last and you didn't have money to get more.: Patient declined  Transportation Needs: Patient Declined (05/04/2024)   Received from [facility] - Transportation    Lack of Transportation (Medical): Patient declined    Lack of Transportation (Non-Medical): Patient declined  Physical Activity: Insufficiently Active (06/22/2023)   Received from [facility]   Exercise Vital Sign    On average, how many days per week do you engage in moderate to strenuous exercise (like a brisk walk)?: 4 days    On average, how many minutes do you engage in exercise at this level?: 30 min  Stress: Not on file (10/20/2023)  Social Connections: Socially Integrated (06/22/2023)   Received from [facility]   Social Network    How would you rate your social network (family, work, friends)?: Good participation with social networks   Family History  Problem Relation Age of Onset   Cancer Mother        cervical   Heart disease Mother        CABGx3 in 2014   CAD Father    Congestive Heart Failure Father    Hypertension Father    Renal Disease Father        CKD, ESRD   Hypertension Brother    Hypertension Brother    Hypertension Sister    No Known Allergies Current Outpatient Medications  Medication Sig Dispense Refill   aspirin EC 81 MG tablet Take 81 mg by mouth daily.     carvedilol  (COREG ) 25 MG tablet TAKE 1 TABLET BY MOUTH TWICE A DAY 180 tablet 2   lisinopril -hydrochlorothiazide  (ZESTORETIC ) 20-25 MG tablet Take 1 tablet by mouth daily. 90 tablet 2   No current facility-administered medications for this visit.   No results found.  Review of Systems:   A ROS was performed including pertinent positives and negatives as documented in the HPI.  Physical Exam :   Constitutional: NAD and appears stated age Neurological: Alert and oriented Psych: Appropriate affect and cooperative There were  no vitals taken for this visit.   Comprehensive Musculoskeletal Exam:    Mild swelling in the posterior ankle and Achilles tendon region.  Positive Thompson test.  Slight decrease in strength with resisted plantarflexion due to contralateral side.  Patient is ambulating with antalgic gait and normal shoes without assistive device.  Bedside ultrasound examination demonstrates discontinuity of Achilles fibers.  Imaging:        Assessment & Plan Right Achilles tendon injury Patient sustained a right ankle injury approximately 2 weeks ago  and symptoms appear consistent with an Achilles tear.  MRI was not able to be completed due to an incompatible pacemaker lead.  Discussed with patient that based on ultrasound findings there is a likely tear however we would recommend nonoperative management moving forward.  Discussed with patient to resume use of the cam boot and heel lift with weightbearing as tolerated.  Will plan to see her back in 3 weeks for reassessment and may consider addition of physical therapy at that time.       I personally saw and evaluated the patient, and participated in the management and treatment plan.  Leonce Reveal, PA-C Orthopedics

## 2024-10-16 ENCOUNTER — Encounter: Payer: Self-pay | Admitting: Radiology

## 2024-10-30 ENCOUNTER — Encounter

## 2024-11-01 ENCOUNTER — Ambulatory Visit (HOSPITAL_BASED_OUTPATIENT_CLINIC_OR_DEPARTMENT_OTHER): Admitting: Student

## 2024-11-07 ENCOUNTER — Ambulatory Visit: Payer: Self-pay | Admitting: Family Medicine

## 2024-11-07 ENCOUNTER — Ambulatory Visit: Admitting: Family Medicine

## 2024-11-07 ENCOUNTER — Other Ambulatory Visit: Payer: Self-pay | Admitting: Family Medicine

## 2024-11-07 ENCOUNTER — Encounter: Payer: Self-pay | Admitting: Family Medicine

## 2024-11-07 VITALS — BP 118/70 | HR 70 | Temp 97.5°F | Ht 66.0 in | Wt 206.0 lb

## 2024-11-07 DIAGNOSIS — Z8249 Family history of ischemic heart disease and other diseases of the circulatory system: Secondary | ICD-10-CM

## 2024-11-07 DIAGNOSIS — I5022 Chronic systolic (congestive) heart failure: Secondary | ICD-10-CM

## 2024-11-07 DIAGNOSIS — Z23 Encounter for immunization: Secondary | ICD-10-CM | POA: Diagnosis not present

## 2024-11-07 DIAGNOSIS — Z136 Encounter for screening for cardiovascular disorders: Secondary | ICD-10-CM

## 2024-11-07 DIAGNOSIS — R141 Gas pain: Secondary | ICD-10-CM | POA: Diagnosis not present

## 2024-11-07 DIAGNOSIS — E7800 Pure hypercholesterolemia, unspecified: Secondary | ICD-10-CM

## 2024-11-07 DIAGNOSIS — I10 Essential (primary) hypertension: Secondary | ICD-10-CM

## 2024-11-07 LAB — CBC WITH DIFFERENTIAL/PLATELET
Basophils Absolute: 0 K/uL (ref 0.0–0.1)
Basophils Relative: 0.3 % (ref 0.0–3.0)
Eosinophils Absolute: 0 K/uL (ref 0.0–0.7)
Eosinophils Relative: 0.9 % (ref 0.0–5.0)
HCT: 38.9 % (ref 36.0–46.0)
Hemoglobin: 13.1 g/dL (ref 12.0–15.0)
Lymphocytes Relative: 36.5 % (ref 12.0–46.0)
Lymphs Abs: 1.7 K/uL (ref 0.7–4.0)
MCHC: 33.6 g/dL (ref 30.0–36.0)
MCV: 87.3 fl (ref 78.0–100.0)
Monocytes Absolute: 0.2 K/uL (ref 0.1–1.0)
Monocytes Relative: 5.4 % (ref 3.0–12.0)
Neutro Abs: 2.6 K/uL (ref 1.4–7.7)
Neutrophils Relative %: 56.9 % (ref 43.0–77.0)
Platelets: 214 K/uL (ref 150.0–400.0)
RBC: 4.46 Mil/uL (ref 3.87–5.11)
RDW: 13.6 % (ref 11.5–15.5)
WBC: 4.6 K/uL (ref 4.0–10.5)

## 2024-11-07 LAB — LIPID PANEL
Cholesterol: 225 mg/dL — ABNORMAL HIGH (ref 0–200)
HDL: 63.5 mg/dL (ref >39.00)
LDL Cholesterol: 142 mg/dL — ABNORMAL HIGH (ref 0–99)
NonHDL: 161.4
Total CHOL/HDL Ratio: 4
Triglycerides: 95 mg/dL (ref 0.0–149.0)
VLDL: 19 mg/dL (ref 0.0–40.0)

## 2024-11-07 LAB — COMPREHENSIVE METABOLIC PANEL WITH GFR
ALT: 9 U/L (ref 0–35)
AST: 15 U/L (ref 0–37)
Albumin: 4.6 g/dL (ref 3.5–5.2)
Alkaline Phosphatase: 53 U/L (ref 39–117)
BUN: 14 mg/dL (ref 6–23)
CO2: 34 meq/L — ABNORMAL HIGH (ref 19–32)
Calcium: 9.6 mg/dL (ref 8.4–10.5)
Chloride: 100 meq/L (ref 96–112)
Creatinine, Ser: 0.95 mg/dL (ref 0.40–1.20)
GFR: 65.76 mL/min (ref >60.00)
Glucose, Bld: 108 mg/dL — ABNORMAL HIGH (ref 70–99)
Potassium: 3.7 meq/L (ref 3.5–5.1)
Sodium: 140 meq/L (ref 135–145)
Total Bilirubin: 0.6 mg/dL (ref 0.2–1.2)
Total Protein: 8 g/dL (ref 6.0–8.3)

## 2024-11-07 LAB — TSH: TSH: 3.15 u[IU]/mL (ref 0.35–5.50)

## 2024-11-07 MED ORDER — ATORVASTATIN CALCIUM 10 MG PO TABS: 10.0000 mg | ORAL_TABLET | Freq: Every day | ORAL | 0 refills | Status: AC

## 2024-11-07 NOTE — Patient Instructions (Addendum)
 Please go downstairs for labs before you leave.  Once I have your results, I will send in a cholesterol medication.  You should have your cholesterol rechecked, fasting, in 6 to 8 weeks.  I am ordering the CT coronary calcium  test to look for blockages.  They will call you to schedule this.  Today you received the Tdap (tetanus, diphtheria and pertussis) and a flu shot.  My records indicate that you are also due for pneumonia and shingles vaccines.  You can get these at your pharmacy or schedule a nurse visit to get these here.  It also looks like you may be due for your mammogram soon.

## 2024-11-07 NOTE — Progress Notes (Signed)
 Subjective:     Patient ID: Alexis Clay, female    DOB: [birth year], 58 y.o.   MRN: [id number]  Chief Complaint  Patient presents with   Follow-up    6 month    HPI  Discussed the use of AI scribe software for clinical note transcription with the patient, who gave verbal consent to proceed.  History of Present Illness Alexis Clay is a 58 year old female with chronic heart failure who presents for a follow-up on her chronic health conditions.  Cardiac symptoms and history - Chronic heart failure with pacemaker (two leads) replaced in May - No chest pain, palpitations, dizziness, or shortness of breath - Good energy levels and mood - Strong family history of heart disease and myocardial infarction - Upcoming cardiology appointment scheduled for December 31st  Peripheral edema - Swelling present, attributed to recent injury  Hyperlipidemia - LDL cholesterol measured at 187 mg/dL earlier this year - Not currently taking cholesterol-lowering medication  Gastrointestinal symptoms - Occasional stomach aches associated with gas and heartburn, especially after late dinners - Regular bowel movements without diarrhea  Urinary symptoms - Frequent urination attributed to high water intake     Health Maintenance Due  Topic Date Due   HIV Screening  Never done   Hepatitis C Screening  Never done   Pneumococcal Vaccine: 50+ Years (1 of 2 - PCV) Never done   Hepatitis B Vaccines 19-59 Average Risk (1 of 3 - 19+ 3-dose series) Never done   Zoster Vaccines- Shingrix (1 of 2) Never done   COVID-19 Vaccine (4 - 2025-26 season) 08/14/2024   Mammogram  11/16/2024    Past Medical History:  Diagnosis Date   Anxiety disorder    Biventricular cardiac pacemaker in situ    Bradycardia    Cardiomyopathy (HCC)    CHF (congestive heart failure) (HCC)    Dilated cardiomyopathy (HCC)    Headache    Hypertension, benign    LBBB (left bundle branch block)    Panic disorder  without agoraphobia    Presence of permanent cardiac pacemaker    Sinoatrial node dysfunction (HCC)    Sinus node dysfunction (HCC)    Supraventricular premature beats    SVT (supraventricular tachycardia)    Systolic heart failure (HCC)    Due to hypertension (LVEF 35-55% after med therapy & resynchronizatoin)    Past Surgical History:  Procedure Laterality Date   BIV PACEMAKER GENERATOR CHANGEOUT N/A 07/01/2017   Procedure: BiV Pacemaker Generator Changeout;  Surgeon: Waddell Danelle ORN, MD;  Location: [facility] INVASIVE CV LAB;  Service: Cardiovascular;  Laterality: N/A;   BIV PACEMAKER GENERATOR CHANGEOUT N/A 05/12/2024   Procedure: BIV PACEMAKER GENERATOR CHANGEOUT;  Surgeon: Waddell Danelle ORN, MD;  Location: [facility] INVASIVE CV LAB;  Service: Cardiovascular;  Laterality: N/A;   CARDIAC CATHETERIZATION  2011   With widely patent coronary arteries    CESAREAN SECTION  1991   COLONOSCOPY WITH PROPOFOL  N/A 02/04/2016   Procedure: COLONOSCOPY WITH PROPOFOL ;  Surgeon: Renaye Sous, MD;  Location: [facility] ENDOS[facility];  Service: Endoscopy;  Laterality: N/A;   LEEP  10/2010   PACEMAKER INSERTION  12/26/2009   St. Jude BiV PM.  Model # [id number].  Serial # [id number]     TUBAL LIGATION      Family History  Problem Relation Age of Onset   Cancer Mother        cervical   Heart disease Mother        CABGx3 in 2014  CAD Father    Congestive Heart Failure Father    Hypertension Father    Renal Disease Father        CKD, ESRD   Hypertension Brother    Hypertension Brother    Hypertension Sister     Social History   Socioeconomic History   Marital status: Married    Spouse name: Not on file   Number of children: Not on file   Years of education: Not on file   Highest education level: Not on file  Occupational History   Not on file  Tobacco Use   Smoking status: Never   Smokeless tobacco: Never  Vaping Use   Vaping status: Never Used  Substance and Sexual Activity   Alcohol use: Yes    Comment:  occasionally   Drug use: No   Sexual activity: Not on file  Other Topics Concern   Not on file  Social History Narrative   Not on file   Social Drivers of Health   Financial Resource Strain: Patient Declined (05/04/2024)   Received from [facility]   Overall Financial Resource Strain (CARDIA)    Difficulty of Paying Living Expenses: Patient declined  Food Insecurity: Patient Declined (05/04/2024)   Received from [facility]   Hunger Vital Sign    Within the past 12 months, you worried that your food would run out before you got the money to buy more.: Patient declined    Within the past 12 months, the food you bought just didn't last and you didn't have money to get more.: Patient declined  Transportation Needs: Patient Declined (05/04/2024)   Received from [facility] - Transportation    Lack of Transportation (Medical): Patient declined    Lack of Transportation (Non-Medical): Patient declined  Physical Activity: Insufficiently Active (06/22/2023)   Received from [facility]   Exercise Vital Sign    On average, how many days per week do you engage in moderate to strenuous exercise (like a brisk walk)?: 4 days    On average, how many minutes do you engage in exercise at this level?: 30 min  Stress: Not on file (10/20/2023)  Social Connections: Socially Integrated (06/22/2023)   Received from [facility]   Social Network    How would you rate your social network (family, work, friends)?: Good participation with social networks  Intimate Partner Violence: Not At Risk (06/22/2023)   Received from [facility]   HITS    Over the last 12 months how often did your partner physically hurt you?: Never    Over the last 12 months how often did your partner insult you or talk down to you?: Never    Over the last 12 months how often did your partner threaten you with physical harm?: Never    Over the last 12 months how often did your partner scream or curse at you?: Never     Outpatient Medications Prior to Visit  Medication Sig Dispense Refill   aspirin EC 81 MG tablet Take 81 mg by mouth daily.     carvedilol  (COREG ) 25 MG tablet TAKE 1 TABLET BY MOUTH TWICE A DAY 180 tablet 2   lisinopril -hydrochlorothiazide  (ZESTORETIC ) 20-25 MG tablet Take 1 tablet by mouth daily. 90 tablet 2   WEGOVY 0.5 MG/0.5ML SOAJ SQ injection Inject 0.5 mg into the skin once a week.     No facility-administered medications prior to visit.    No Known Allergies  Review of Systems  Constitutional:  Negative for chills, fever and malaise/fatigue.  Respiratory:  Negative for shortness of breath.   Cardiovascular:  Negative for chest pain, palpitations and leg swelling.  Gastrointestinal:  Negative for abdominal pain, constipation, diarrhea, nausea and vomiting.       Intermittent heartburn and gas  Genitourinary:  Negative for dysuria, frequency and urgency.  Neurological:  Negative for dizziness, focal weakness and headaches.  Psychiatric/Behavioral:  Negative for depression. The patient is not nervous/anxious.        Objective:    Physical Exam Constitutional:      General: She is not in acute distress.    Appearance: She is not ill-appearing.  Eyes:     Extraocular Movements: Extraocular movements intact.     Conjunctiva/sclera: Conjunctivae normal.  Cardiovascular:     Rate and Rhythm: Normal rate and regular rhythm.  Pulmonary:     Effort: Pulmonary effort is normal.     Breath sounds: Normal breath sounds.  Musculoskeletal:     Cervical back: Normal range of motion and neck supple.     Right lower leg: No edema.     Left lower leg: No edema.  Skin:    General: Skin is warm and dry.  Neurological:     General: No focal deficit present.     Mental Status: She is alert and oriented to person, place, and time.     Cranial Nerves: No cranial nerve deficit.     Motor: No weakness.     Coordination: Coordination normal.     Gait: Gait normal.  Psychiatric:         Mood and Affect: Mood normal.        Behavior: Behavior normal.        Thought Content: Thought content normal.      BP 118/70   Pulse 70   Temp (!) 97.5 F (36.4 C) (Oral)   Ht 5' 6 (1.676 m)   Wt 206 lb (93.4 kg)   SpO2 96%   BMI 33.25 kg/m  Wt Readings from Last 3 Encounters:  11/07/24 206 lb (93.4 kg)  08/15/24 209 lb (94.8 kg)  05/12/24 204 lb (92.5 kg)        Assessment & Plan:   Problem List Items Addressed This Visit     Chronic systolic heart failure (HCC)   Essential hypertension   Relevant Orders   CBC with Differential/Platelet (Completed)   Comprehensive metabolic panel with GFR (Completed)   TSH (Completed)   Hyperlipidemia - Primary   Relevant Orders   CT CARDIAC SCORING (SELF PAY ONLY)   Lipid panel (Completed)   Other Visit Diagnoses       Need for influenza vaccination       Relevant Orders   Flu vaccine trivalent PF, 6mos and older(Flulaval,Afluria,Fluarix,Fluzone) (Completed)     Gas pain         Family history of heart disease       Relevant Orders   CT CARDIAC SCORING (SELF PAY ONLY)     Screening for ischemic heart disease       Relevant Orders   CT CARDIAC SCORING (SELF PAY ONLY)     Need for diphtheria-tetanus-pertussis (Tdap) vaccine       Relevant Orders   Tdap vaccine greater than or equal to 7yo IM (Completed)       Assessment and Plan Assessment & Plan Chronic systolic heart failure Well-managed with no swelling or fluid retention. Recent pacemaker change in May. - Continue current management  and follow-up with cardiology. - reviewed cardiology notes and results.   Hyperlipidemia with increased cardiovascular risk LDL cholesterol was 187 mg/dL, indicating high cardiovascular risk. Strong family history of heart disease. Open to starting cholesterol medication. - Ordered fasting lipid panel to assess current cholesterol levels. - Prescribed cholesterol medication pending lipid panel results. - Ordered CT coronary  calcium  test for cardiovascular risk assessment. - Will recheck cholesterol levels 6-8 weeks after starting medication.  - She sees cardiology in approximately 6 weeks   Essential hypertension Blood pressure is well-controlled with current medication regimen. - Continue current antihypertensive medications. - Continue low sodium diet   Gastrointestinal symptoms (gas, stomach ache, heartburn) Experiences gas, stomach ache, and heartburn, particularly after late meals. No diarrhea or abnormal stool patterns. Up to date on colonoscopy, next due next year. - Advised against eating late and lying down within 2-3 hours of meals to reduce symptoms.  Reviewed health maintenance and she is overdue for Tdap.  Counseling done on this vaccine Tdap updated Flu shot given   I am having Alexis Clay maintain her aspirin EC, carvedilol , lisinopril -hydrochlorothiazide , and Wegovy.  No orders of the defined types were placed in this encounter.

## 2024-11-08 ENCOUNTER — Ambulatory Visit (HOSPITAL_BASED_OUTPATIENT_CLINIC_OR_DEPARTMENT_OTHER): Admitting: Student

## 2024-11-15 ENCOUNTER — Ambulatory Visit (INDEPENDENT_AMBULATORY_CARE_PROVIDER_SITE_OTHER): Admitting: Student

## 2024-11-15 DIAGNOSIS — S86001A Unspecified injury of right Achilles tendon, initial encounter: Secondary | ICD-10-CM | POA: Diagnosis not present

## 2024-11-15 NOTE — Progress Notes (Signed)
 Chief Complaint: Right ankle injury    History of Present Illness  11/15/24: Patient presents today for follow-up of a right Achilles injury that occurred 2 months ago.  Overall she reports doing about 60% better.  She has been utilizing her boot some but does walk and perform some  low-level activities without it.   10/11/24: Patient presents today for follow-up evaluation of her right ankle injury.  Patient reports that she does continue to have some swelling and discomfort in the back of the right ankle and lower leg.  She has been utilizing the cam boot with a heel lift but has just recently began trying to get back into normal shoes.  An MRI had been ordered to the right ankle for further assessment although was not able to to be completed due to one of her pacemaker leads not being compatible.   Surgical History:   None  PMH/PSH/Family History/Social History/Meds/Allergies:    Past Medical History:  Diagnosis Date   Anxiety disorder    Biventricular cardiac pacemaker in situ    Bradycardia    Cardiomyopathy (HCC)    CHF (congestive heart failure) (HCC)    Dilated cardiomyopathy (HCC)    Headache    Hypertension, benign    LBBB (left bundle branch block)    Panic disorder without agoraphobia    Presence of permanent cardiac pacemaker    Sinoatrial node dysfunction (HCC)    Sinus node dysfunction (HCC)    Supraventricular premature beats    SVT (supraventricular tachycardia)    Systolic heart failure (HCC)    Due to hypertension (LVEF 35-55% after med therapy & resynchronizatoin)   Past Surgical History:  Procedure Laterality Date   BIV PACEMAKER GENERATOR CHANGEOUT N/A 07/01/2017   Procedure: BiV Pacemaker Generator Changeout;  Surgeon: Waddell Danelle ORN, MD;  Location[facility] INVASIVE CV LAB;  Service: Cardiovascular;  Laterality: N/A;   BIV PACEMAKER GENERATOR CHANGEOUT N/A 05/12/2024   Procedure: BIV PACEMAKER GENERATOR CHANGEOUT;  Surgeon:  Waddell Danelle ORN, MD;  Location: [facility] INVASIVE CV LAB;  Service: Cardiovascular;  Laterality: N/A;   CARDIAC CATHETERIZATION  2011   With widely patent coronary arteries    CESAREAN SECTION  1991   COLONOSCOPY WITH PROPOFOL  N/A 02/04/2016   Procedure: COLONOSCOPY WITH PROPOFOL ;  Surgeon: Renaye Sous, MD;  Location: [facility] ENDOSCOPY;  Service: Endoscopy;  Laterality: N/A;   LEEP  10/2010   PACEMAKER INSERTION  12/26/2009   St. Jude BiV PM.  Model # [id number].  Serial # [id number]     TUBAL LIGATION     Social History   Socioeconomic History   Marital status: Married    Spouse name: Not on file   Number of children: Not on file   Years of education: Not on file   Highest education level: Not on file  Occupational History   Not on file  Tobacco Use   Smoking status: Never   Smokeless tobacco: Never  Vaping Use   Vaping status: Never Used  Substance and Sexual Activity   Alcohol use: Yes    Comment: occasionally   Drug use: No   Sexual activity: Not on file  Other Topics Concern   Not on file  Social History Narrative   Not on file   Social Drivers of Health   Financial Resource Strain:  Patient Declined (05/04/2024)   Received [facility]   Overall Financial Resource Strain (CARDIA)    Difficulty of Paying Living Expenses: Patient declined  Food Insecurity: Patient Declined (05/04/2024)   [facility]   Hunger Vital Sign    Within the past 12 months, you worried that your food would run out before you got the money to buy more.: Patient declined    Within the past 12 months, the food you bought just didn't last and you didn't have money to get more.: Patient declined  Transportation Needs: Patient Declined (05/04/2024)   Received from [facility] - [facility]    Lack of Transportation (Medical): Patient declined    Lack of Transportation (Non-Medical): Patient declined  Physical Activity: Insufficiently Active (06/22/2023)   Received from [facility]   Exercise Vital Sign    On average, how many days per week do you engage in moderate to strenuous exercise (like a brisk walk)?: 4 days    On average, how many minutes do you engage in exercise at this level?: 30 min  Stress: Not on file (10/20/2023)  Social Connections: Socially Integrated (06/22/2023)   Received from [facility]   Social Network    How would you rate your social network (family, work, friends)?: Good participation with social networks   Family History  Problem Relation Age of Onset   Cancer Mother        cervical   Heart disease Mother        CABGx3 in 2014   CAD Father    Congestive Heart Failure Father    Hypertension Father    Renal Disease Father        CKD, ESRD   Hypertension Brother    Hypertension Brother    Hypertension Sister    No Known Allergies Current Outpatient Medications  Medication Sig Dispense Refill   aspirin EC 81 MG tablet Take 81 mg by mouth daily.     atorvastatin  (LIPITOR) 10 MG tablet Take 1 tablet (10 mg total) by mouth daily. 90 tablet 0   carvedilol  (COREG ) 25 MG tablet TAKE 1 TABLET BY MOUTH TWICE A DAY 180 tablet 2   lisinopril -hydrochlorothiazide  (ZESTORETIC ) 20-25 MG tablet Take 1 tablet by mouth daily. 90 tablet 2   WEGOVY 0.5 MG/0.5ML SOAJ SQ injection Inject 0.5 mg into the skin once a week.     No current facility-administered medications for this visit.   No results found.  Review of Systems:   A ROS was performed including pertinent positives and negatives as documented in the HPI.  Physical Exam :   Constitutional: NAD and appears stated age Neurological: Alert and oriented Psych: Appropriate affect and cooperative There were no vitals taken for this visit.   Comprehensive Musculoskeletal Exam:    No significant tenderness over the right Achilles or ankle.  There is motion with Thompson test although decreased compared to contralateral side.  Resisted plantarflexion strength 4/5.  Calf is supple and  nontender.  Imaging:        Assessment & Plan Right Achilles tendon injury Patient is now 8 weeks status post injury to the right Achilles tendon consistent with a tear.  She was unable to undergo MRI evaluation due to her pacemaker.  This has been treated conservatively with use of a cam boot and today she appears to be progressing well.  Reports about 60% improvement at this time.  Discussed that as she is now 2 months out  we can begin working into strengthening, therefore we will provide a referral to physical therapy today.  PT will help facilitate discontinuation of the walking boot.  Will plan to see her back in 6 weeks for reassessment.    I personally saw and evaluated the patient, and participated in the management and treatment plan.  Leonce Reveal, PA-C Orthopedics

## 2024-11-15 NOTE — Therapy (Unsigned)
 OUTPATIENT PHYSICAL THERAPY LOWER EXTREMITY EVALUATION   Patient Name: Alexis Clay MRN: [id number] DOB:[birth year], 58 y.o., female Today's Date: 11/16/2024  END OF SESSION:  PT End of Session - 11/16/24 1016     Visit Number 1    Number of Visits 16    Date for Recertification  01/11/25    Authorization Type [employer]    PT Start Time 1017    PT Stop Time 1057    PT Time Calculation (min) 40 min    Activity Tolerance Patient tolerated treatment well    Behavior During Therapy WFL for tasks assessed/performed          Past Medical History:  Diagnosis Date   Anxiety disorder    Biventricular cardiac pacemaker in situ    Bradycardia    Cardiomyopathy (HCC)    CHF (congestive heart failure) (HCC)    Dilated cardiomyopathy (HCC)    Headache    Hypertension, benign    LBBB (left bundle branch block)    Panic disorder without agoraphobia    Presence of permanent cardiac pacemaker    Sinoatrial node dysfunction (HCC)    Sinus node dysfunction (HCC)    Supraventricular premature beats    SVT (supraventricular tachycardia)    Systolic heart failure (HCC)    Due to hypertension (LVEF 35-55% after med therapy & resynchronizatoin)   Past Surgical History:  Procedure Laterality Date   BIV PACEMAKER GENERATOR CHANGEOUT N/A 07/01/2017   Procedure: BiV Pacemaker Generator Changeout;  Surgeon: Waddell Danelle ORN, MD;  Location: [facility] INVASIVE CV LAB;  Service: Cardiovascular;  Laterality: N/A;   BIV PACEMAKER GENERATOR CHANGEOUT N/A 05/12/2024   Procedure: BIV PACEMAKER GENERATOR CHANGEOUT;  Surgeon: Waddell Danelle ORN, MD;  Location: [facility] INVASIVE CV LAB;  Service: Cardiovascular;  Laterality: N/A;   CARDIAC CATHETERIZATION  2011   With widely patent coronary arteries    CESAREAN SECTION  1991   COLONOSCOPY WITH PROPOFOL  N/A 02/04/2016   Procedure: COLONOSCOPY WITH PROPOFOL ;  Surgeon: Renaye Sous, MD;  Location: [facility];  Service: Endoscopy;  Laterality: N/A;   LEEP  10/2010    PACEMAKER INSERTION  12/26/2009   St. Jude BiV PM.  Model # [id number].  Serial # [id number]     TUBAL LIGATION     Patient Active Problem List   Diagnosis Date Noted   Elevated fasting glucose 05/05/2024   At increased risk for cardiovascular disease 05/04/2024   Vitamin D deficiency 06/22/2023   Hyperlipidemia 06/22/2023   Congestive heart failure (HCC) 06/22/2023   Dermatitis 11/18/2022   Sinoatrial node dysfunction (HCC)    OSA on CPAP 09/03/2020   Sinus node dysfunction (HCC) 04/02/2020   Heart disease 08/09/2019   Snoring 11/13/2015   Biventricular cardiac pacemaker in situ 08/16/2014   Chronic systolic heart failure (HCC) 08/16/2014   Essential hypertension 08/16/2014    PCP: Lendia Boby CROME, NP-C  REFERRING PROVIDER: Emiliano Leonce CROME, PA-C  REFERRING DIAG: [phone] (ICD-10-CM) - Achilles tendon injury, right, initial encounter  THERAPY DIAG:  Pain in right ankle and joints of right foot  Stiffness of right ankle, not elsewhere classified  Other abnormalities of gait and mobility  Muscle weakness (generalized)  Rationale for Evaluation and Treatment: Rehabilitation  ONSET DATE: Roughly 8 weeks ago  SUBJECTIVE:   SUBJECTIVE STATEMENT: Reports she had an injury back in October and she has been in a boot for several weeks now. Reports she has a partial achilles tear. Reports she used to do a lpot of walking and  she doesn't have her walk strike back. Still limited by pain.   PERTINENT HISTORY: Anxiety, cardiac pacemaker, bradycardia, cardiomyopathy, CHF, hypertension, LBBB, panic disorder, SA Node dysfunction, supraventricular premature beats, SVT, systolic heart failure  PAIN:  Are you having pain? No  PRECAUTIONS: None  WEIGHT BEARING RESTRICTIONS: Yes WB in CAM boot   FALLS:  Has patient fallen in last 6 months? No  OCCUPATION: [employer]   PLOF: Independent  PATIENT GOALS: Walking  NEXT MD VISIT:   OBJECTIVE: (objective measures from initial  evaluation unless otherwise dated)  PATIENT SURVEYS:  LEFS  Extreme difficulty/unable (0), Quite a bit of difficulty (1), Moderate difficulty (2), Little difficulty (3), No difficulty (4) Survey date:  Eval   Any of your usual work, housework or school activities 4  2. Usual hobbies, recreational or sporting activities 2  3. Getting into/out of the bath 4  4. Walking between rooms 3  5. Putting on socks/shoes 3  6. Squatting  4  7. Lifting an object, like a bag of groceries from the floor 4  8. Performing light activities around your home 4  9. Performing heavy activities around your home 4  10. Getting into/out of a car 4  11. Walking 2 blocks 1  12. Walking 1 mile 1  13. Going up/down 10 stairs (1 flight) 3  14. Standing for 1 hour 4  15.  sitting for 1 hour 4  16. Running on even ground 1  17. Running on uneven ground 1  18. Making sharp turns while running fast 1  19. Hopping  1  20. Rolling over in bed 4  Score total:  56/80     COGNITION: Overall cognitive status: Within functional limits for tasks assessed     SENSATION: WFL  POSTURE: No Significant postural limitations  PALPATION: TTP to right achilles  LOWER EXTREMITY MMT:  MMT Right eval Left eval  Hip flexion 5 5  Hip extension    Hip abduction (seated) 5 5  Hip adduction (seated)  5 5  Hip internal rotation    Hip external rotation    Knee flexion 5 5  Knee extension 5 5  Ankle dorsiflexion 5 5  Ankle plantarflexion Calf raise difficult    Ankle inversion    Ankle eversion     (Blank rows = not tested) *= pain/symptoms  LOWER EXTREMITY ROM:  Active ROM Right eval Left eval  Hip flexion    Hip extension    Hip abduction    Hip adduction    Hip internal rotation    Hip external rotation    Knee flexion    Knee extension    Ankle dorsiflexion 4 9  Ankle plantarflexion 30 35  Ankle inversion 35 42  Ankle eversion 20 18   (Blank rows = not tested) *= pain/symptoms  FUNCTIONAL TESTS:   5 times sit to stand: 8.58 seconds  GAIT: Distance walked: 50 ft from lobby to treatment room, 50 ft in hallway  Assistive device utilized: None Level of assistance: Complete Independence Comments: ambulates with early heel off on right ankle, antalgic gait  Stairs: difficulty and pain in ankle when descending stairs, early heel off, one step at a time but  able to reciprocally navigate with increased time and compensation    TODAY'S TREATMENT:  DATE:  11/16/24  Evaluation  4 way ankle x10 Calf raises x10 Gait assessment and stair navigation   PATIENT EDUCATION:  Education details: Patient educated on exam findings, POC, scope of PT, HEP, relevant anatomy and biomechanics, and activity tolerance. Person educated: Patient Education method: Explanation, Demonstration, and Handouts Education comprehension: verbalized understanding, returned demonstration, verbal cues required, and tactile cues required  HOME EXERCISE PROGRAM: Access Code: [email or web address] URL: [email or web address] Date: 11/16/2024 Prepared by: Lili Finder  Exercises - Long Sitting Ankle Eversion with Resistance  - 1 x daily - 7 x weekly - 3 sets - 10 reps - Long Sitting Ankle Plantar Flexion with Resistance  - 1 x daily - 7 x weekly - 3 sets - 10 reps - Long Sitting Ankle Dorsiflexion with Anchored Resistance  - 1 x daily - 7 x weekly - 3 sets - 10 reps - Long Sitting Ankle Inversion with Resistance  - 1 x daily - 7 x weekly - 3 sets - 10 reps - Heel Raises with Counter Support  - 1 x daily - 7 x weekly - 3 sets - 10 reps  ASSESSMENT:  CLINICAL IMPRESSION: Patient a 58 y.o. y.o. female who was seen today for physical therapy evaluation and treatment for right ankle pain. Patient presents with pain limited deficits in lower extremity strength, ROM, endurance, activity tolerance,  and functional mobility with ADL. Patient is having to modify and restrict ADL as indicated by outcome measure score as well as subjective information and objective measures which is affecting overall participation. Patient will benefit from skilled physical therapy in order to improve function and reduce impairment.  OBJECTIVE IMPAIRMENTS: Abnormal gait, decreased activity tolerance, decreased balance, decreased endurance, decreased mobility, difficulty walking, decreased ROM, decreased strength, increased muscle spasms, impaired flexibility, improper body mechanics, and pain  ACTIVITY LIMITATIONS: lifting, bending, standing, squatting, stairs, transfers, locomotion level, and caring for others  PARTICIPATION LIMITATIONS: meal prep, cleaning, laundry, shopping, community activity, occupation, and yard work  PERSONAL FACTORS: 3+ comorbidities: Anxiety, cardiac pacemaker, bradycardia, cardiomyopathy, CHF, hypertension, LBBB, panic disorder, SA Node dysfunction, supraventricular premature beats, SVT, systolic heart failure are also affecting patient's functional outcome.   REHAB POTENTIAL: Good  CLINICAL DECISION MAKING: Evolving/moderate complexity  EVALUATION COMPLEXITY: Moderate   GOALS: Goals reviewed with patient? Yes  SHORT TERM GOALS: Target date: 11/29/2024  Patient will be independent with HEP in order to improve functional outcomes. Baseline: Goal status: INITIAL  2.  Patient will report at least 25% improvement in symptoms for improved quality of life. Baseline: Goal status: INITIAL   LONG TERM GOALS: Target date: 01/11/2025  Patient will report at least 75% improvement in symptoms for improved quality of life. Baseline:  Goal status: INITIAL  2.  Patient will improve LEFS score by at least 9 points in order to indicate improved tolerance to activity. Baseline:  Goal status: INITIAL  3.  Patient will be able to navigate stairs with reciprocal pattern without  compensation in order to demonstrate improved LE strength. Baseline:  Goal status: INITIAL  4.  Patient will ambulate 50 ft in hallway with reciprocal gait pattern and no compensation to indicate improvement in activity tolerance and functional mobility.  Baseline:  Goal status: INITIAL  PLAN:  PT FREQUENCY: 1x/week  PT DURATION: 8 weeks  PLANNED INTERVENTIONS: 97164- PT Re-evaluation, 97110-Therapeutic exercises, 97530- Therapeutic activity, [id number]- Neuromuscular re-education, 97535- Self Care, 02859- Manual therapy, [id number]- Gait training, [phone]- Orthotic Fit/training, [id number]- Canalith repositioning, [id number]- Aquatic Therapy, [id number]- Splinting, [id number]- Wound care (first  20 sq cm), 97598- Wound care (each additional 20 sq cm)Patient/Family education, Balance training, Stair training, Taping, Dry Needling, Joint mobilization, Joint manipulation, Spinal manipulation, Spinal mobilization, Scar mobilization, and DME instructions.  PLAN FOR NEXT SESSION: plan to progress ankle exercises as tolerated, consider HHD testing, continue gait training   Lili Finder, Student-PT 11/16/2024, 10:57 AM   This entire session was performed under direct supervision and direction of a licensed therapist/therapist assistant . I have personally read, edited and approve of the note as written. 11:12 AM, 11/16/24 Prentice CANDIE Stains PT, DPT Physical Therapist at [facility]

## 2024-11-16 ENCOUNTER — Ambulatory Visit (HOSPITAL_BASED_OUTPATIENT_CLINIC_OR_DEPARTMENT_OTHER): Admitting: Physical Therapy

## 2024-11-16 ENCOUNTER — Encounter (HOSPITAL_BASED_OUTPATIENT_CLINIC_OR_DEPARTMENT_OTHER): Payer: Self-pay | Admitting: Physical Therapy

## 2024-11-16 ENCOUNTER — Other Ambulatory Visit: Payer: Self-pay

## 2024-11-16 DIAGNOSIS — M25571 Pain in right ankle and joints of right foot: Secondary | ICD-10-CM | POA: Diagnosis present

## 2024-11-16 DIAGNOSIS — R2689 Other abnormalities of gait and mobility: Secondary | ICD-10-CM | POA: Insufficient documentation

## 2024-11-16 DIAGNOSIS — M6281 Muscle weakness (generalized): Secondary | ICD-10-CM | POA: Insufficient documentation

## 2024-11-16 DIAGNOSIS — S86001A Unspecified injury of right Achilles tendon, initial encounter: Secondary | ICD-10-CM | POA: Diagnosis not present

## 2024-11-16 DIAGNOSIS — M25671 Stiffness of right ankle, not elsewhere classified: Secondary | ICD-10-CM | POA: Insufficient documentation

## 2024-11-17 ENCOUNTER — Ambulatory Visit (HOSPITAL_COMMUNITY)
Admission: RE | Admit: 2024-11-17 | Discharge: 2024-11-17 | Disposition: A | Discharge status: Home / Self Care | Payer: Self-pay | Source: Ambulatory Visit | Attending: Family Medicine | Admitting: Family Medicine

## 2024-11-17 DIAGNOSIS — Z136 Encounter for screening for cardiovascular disorders: Secondary | ICD-10-CM | POA: Insufficient documentation

## 2024-11-17 DIAGNOSIS — Z8249 Family history of ischemic heart disease and other diseases of the circulatory system: Secondary | ICD-10-CM | POA: Insufficient documentation

## 2024-11-17 DIAGNOSIS — E7800 Pure hypercholesterolemia, unspecified: Secondary | ICD-10-CM | POA: Insufficient documentation

## 2024-11-21 ENCOUNTER — Encounter (HOSPITAL_BASED_OUTPATIENT_CLINIC_OR_DEPARTMENT_OTHER): Payer: Self-pay | Admitting: Physical Therapy

## 2024-11-21 ENCOUNTER — Ambulatory Visit (HOSPITAL_BASED_OUTPATIENT_CLINIC_OR_DEPARTMENT_OTHER): Admitting: Physical Therapy

## 2024-11-21 DIAGNOSIS — M25571 Pain in right ankle and joints of right foot: Secondary | ICD-10-CM | POA: Diagnosis not present

## 2024-11-21 DIAGNOSIS — M25671 Stiffness of right ankle, not elsewhere classified: Secondary | ICD-10-CM

## 2024-11-21 DIAGNOSIS — R2689 Other abnormalities of gait and mobility: Secondary | ICD-10-CM

## 2024-11-21 DIAGNOSIS — M6281 Muscle weakness (generalized): Secondary | ICD-10-CM

## 2024-11-21 NOTE — Therapy (Signed)
 OUTPATIENT PHYSICAL THERAPY LOWER EXTREMITY TREATMENT   Patient Name: Alexis Clay MRN: [id number] DOB:September 22, 1966, 58 y.o., female Today's Date: 11/21/2024  END OF SESSION:    Past Medical History:  Diagnosis Date   Anxiety disorder    Biventricular cardiac pacemaker in situ    Bradycardia    Cardiomyopathy (HCC)    CHF (congestive heart failure) (HCC)    Dilated cardiomyopathy (HCC)    Headache    Hypertension, benign    LBBB (left bundle branch block)    Panic disorder without agoraphobia    Presence of permanent cardiac pacemaker    Sinoatrial node dysfunction (HCC)    Sinus node dysfunction (HCC)    Supraventricular premature beats    SVT (supraventricular tachycardia)    Systolic heart failure (HCC)    Due to hypertension (LVEF 35-55% after med therapy & resynchronizatoin)   Past Surgical History:  Procedure Laterality Date   BIV PACEMAKER GENERATOR CHANGEOUT N/A 07/01/2017   Procedure: BiV Pacemaker Generator Changeout;  Surgeon: Waddell Danelle ORN, MD;  Location[facility] INVASIVE CV LAB;  Service: Cardiovascular;  Laterality: N/A;   BIV PACEMAKER GENERATOR CHANGEOUT N/A 05/12/2024   Procedure: BIV PACEMAKER GENERATOR CHANGEOUT;  Surgeon: Waddell Danelle ORN, MD;  Location: [facility] INVASIVE CV LAB;  Service: Cardiovascular;  Laterality: N/A;   CARDIAC CATHETERIZATION  2011   With widely patent coronary arteries    CESAREAN SECTION  1991   COLONOSCOPY WITH PROPOFOL  N/A 02/04/2016   Procedure: COLONOSCOPY WITH PROPOFOL ;  Surgeon: Renaye Sous, MD;  Location: [facility];  Service: Endoscopy;  Laterality: N/A;   LEEP  10/2010   PACEMAKER INSERTION  12/26/2009   St. Jude BiV PM.  Model # [id number].  Serial # [id number]     TUBAL LIGATION     Patient Active Problem List   Diagnosis Date Noted   Elevated fasting glucose 05/05/2024   At increased risk for cardiovascular disease 05/04/2024   Vitamin D deficiency 06/22/2023   Hyperlipidemia 06/22/2023   Congestive heart failure (HCC)  06/22/2023   Dermatitis 11/18/2022   Sinoatrial node dysfunction (HCC)    OSA on CPAP 09/03/2020   Sinus node dysfunction (HCC) 04/02/2020   Heart disease 08/09/2019   Snoring 11/13/2015   Biventricular cardiac pacemaker in situ 08/16/2014   Chronic systolic heart failure (HCC) 08/16/2014   Essential hypertension 08/16/2014    PCP: Lendia Boby CROME, NP-C  REFERRING PROVIDER: Emiliano Leonce CROME, PA-C  REFERRING DIAG: [phone] (ICD-10-CM) - Achilles tendon injury, right, initial encounter  THERAPY DIAG:  No diagnosis found.  Rationale for Evaluation and Treatment: Rehabilitation  ONSET DATE: Roughly 8 weeks ago  SUBJECTIVE:   SUBJECTIVE STATEMENT: Pt is 8 weeks and 4 days s/p injury (DOS 09/22/24).  Pt wore the boot for 6 weeks and also used heel lifts.  Pt is not using the heel lifts currently.  Pt does get a little pain when trying to walk normally.  Pt denies pain currently sitting at rest.  Pt denies any adverse effects after prior treatment.  Pt reports compliance with HEP and is not having issues with HEP.  Pt reports a little pain with HEP.  She has been doing doing  Reports she had an injury back in October and she has been in a boot for several weeks now. Reports she has a partial achilles tear. Reports she used to do a lpot of walking and she doesn't have her walk strike back. Still limited by pain.   PERTINENT HISTORY: Anxiety, cardiac pacemaker, bradycardia,  cardiomyopathy, CHF, hypertension, LBBB, panic disorder, SA Node dysfunction, supraventricular premature beats, SVT, systolic heart failure  PAIN:  Are you having pain? No  PRECAUTIONS: None  WEIGHT BEARING RESTRICTIONS: Yes MD note indicated PT will help facilitate discontinuation of the walking boot.   FALLS:  Has patient fallen in last 6 months? No  OCCUPATION: [employer]   PLOF: Independent  PATIENT GOALS: Walking  NEXT MD VISIT:   OBJECTIVE: (objective measures from initial evaluation unless  otherwise dated)  PATIENT SURVEYS:  LEFS  Extreme difficulty/unable (0), Quite a bit of difficulty (1), Moderate difficulty (2), Little difficulty (3), No difficulty (4) Survey date:  Eval   Any of your usual work, housework or school activities 4  2. Usual hobbies, recreational or sporting activities 2  3. Getting into/out of the bath 4  4. Walking between rooms 3  5. Putting on socks/shoes 3  6. Squatting  4  7. Lifting an object, like a bag of groceries from the floor 4  8. Performing light activities around your home 4  9. Performing heavy activities around your home 4  10. Getting into/out of a car 4  11. Walking 2 blocks 1  12. Walking 1 mile 1  13. Going up/down 10 stairs (1 flight) 3  14. Standing for 1 hour 4  15.  sitting for 1 hour 4  16. Running on even ground 1  17. Running on uneven ground 1  18. Making sharp turns while running fast 1  19. Hopping  1  20. Rolling over in bed 4  Score total:  56/80     COGNITION: Overall cognitive status: Within functional limits for tasks assessed     SENSATION: WFL  POSTURE: No Significant postural limitations  PALPATION: TTP to right achilles  LOWER EXTREMITY MMT:  MMT Right eval Left eval  Hip flexion 5 5  Hip extension    Hip abduction (seated) 5 5  Hip adduction (seated)  5 5  Hip internal rotation    Hip external rotation    Knee flexion 5 5  Knee extension 5 5  Ankle dorsiflexion 5 5  Ankle plantarflexion Calf raise difficult    Ankle inversion    Ankle eversion     (Blank rows = not tested) *= pain/symptoms  LOWER EXTREMITY ROM:  Active ROM Right eval Left eval  Hip flexion    Hip extension    Hip abduction    Hip adduction    Hip internal rotation    Hip external rotation    Knee flexion    Knee extension    Ankle dorsiflexion 4 9  Ankle plantarflexion 30 35  Ankle inversion 35 42  Ankle eversion 20 18   (Blank rows = not tested) *= pain/symptoms  FUNCTIONAL TESTS:  5 times sit to  stand: 8.58 seconds  GAIT: Distance walked: 50 ft from lobby to treatment room, 50 ft in hallway  Assistive device utilized: None Level of assistance: Complete Independence Comments: ambulates with early heel off on right ankle, antalgic gait  Stairs: difficulty and pain in ankle when descending stairs, early heel off, one step at a time but  able to reciprocally navigate with increased time and compensation    TODAY'S TREATMENT:  DATE:  11/21/24: Reviewed current function, pain level, HEP compliance, and response to prior treatment.  DLS: Symmetrical Wb'ing, No pain Gait:  Pt ambulated without boot.  Pt favors R LE though had no significant limp.  Pt has limited toe off.  Pt reports 3-4/10 with ambulation though pain is less when she walks slower.   Scifit recumbent bik x Ankle Eve and Inv with RTB 3x10 each Ankle PF with YTB 3x10 Ankle DF not past neutral with YTB x 10, RTB 2x10 Seated heel raises 2x10 Supine SLR with ankle relaxed 2x10  {PT educated pt with healing and not feeling a pull at he achilles area.  Healing timeframes.  HEP    11/16/24  Evaluation  4 way ankle x10 Calf raises x10 Gait assessment and stair navigation   PATIENT EDUCATION:  Education details: Patient educated on exam findings, POC, scope of PT, HEP, relevant anatomy and biomechanics, and activity tolerance. Person educated: Patient Education method: Explanation, Demonstration, and Handouts Education comprehension: verbalized understanding, returned demonstration, verbal cues required, and tactile cues required  HOME EXERCISE PROGRAM: Access Code: [email or web address] URL: [email or web address] Date: 11/16/2024 Prepared by: Lili Finder  Exercises - Long Sitting Ankle Eversion with Resistance  - 1 x daily - 7 x weekly - 3 sets - 10 reps - Long Sitting Ankle  Plantar Flexion with Resistance  - 1 x daily - 7 x weekly - 3 sets - 10 reps - Long Sitting Ankle Dorsiflexion with Anchored Resistance  - 1 x daily - 7 x weekly - 3 sets - 10 reps - Long Sitting Ankle Inversion with Resistance  - 1 x daily - 7 x weekly - 3 sets - 10 reps - Heel Raises with Counter Support  - 1 x daily - 7 x weekly - 3 sets - 10 reps  ASSESSMENT:  CLINICAL IMPRESSION: Patient a 58 y.o. y.o. female who was seen today for physical therapy evaluation and treatment for right ankle pain. Patient presents with pain limited deficits in lower extremity strength, ROM, endurance, activity tolerance, and functional mobility with ADL. Patient is having to modify and restrict ADL as indicated by outcome measure score as well as subjective information and objective measures which is affecting overall participation. Patient will benefit from skilled physical therapy in order to improve function and reduce impairment.  No increased pain  OBJECTIVE IMPAIRMENTS: Abnormal gait, decreased activity tolerance, decreased balance, decreased endurance, decreased mobility, difficulty walking, decreased ROM, decreased strength, increased muscle spasms, impaired flexibility, improper body mechanics, and pain  ACTIVITY LIMITATIONS: lifting, bending, standing, squatting, stairs, transfers, locomotion level, and caring for others  PARTICIPATION LIMITATIONS: meal prep, cleaning, laundry, shopping, community activity, occupation, and yard work  PERSONAL FACTORS: 3+ comorbidities: Anxiety, cardiac pacemaker, bradycardia, cardiomyopathy, CHF, hypertension, LBBB, panic disorder, SA Node dysfunction, supraventricular premature beats, SVT, systolic heart failure are also affecting patient's functional outcome.   REHAB POTENTIAL: Good  CLINICAL DECISION MAKING: Evolving/moderate complexity  EVALUATION COMPLEXITY: Moderate   GOALS: Goals reviewed with patient? Yes  SHORT TERM GOALS: Target date:  11/29/2024  Patient will be independent with HEP in order to improve functional outcomes. Baseline: Goal status: INITIAL  2.  Patient will report at least 25% improvement in symptoms for improved quality of life. Baseline: Goal status: INITIAL   LONG TERM GOALS: Target date: 01/11/2025  Patient will report at least 75% improvement in symptoms for improved quality of life. Baseline:  Goal status: INITIAL  2.  Patient will improve LEFS score by at least  9 points in order to indicate improved tolerance to activity. Baseline:  Goal status: INITIAL  3.  Patient will be able to navigate stairs with reciprocal pattern without compensation in order to demonstrate improved LE strength. Baseline:  Goal status: INITIAL  4.  Patient will ambulate 50 ft in hallway with reciprocal gait pattern and no compensation to indicate improvement in activity tolerance and functional mobility.  Baseline:  Goal status: INITIAL  PLAN:  PT FREQUENCY: 1x/week  PT DURATION: 8 weeks  PLANNED INTERVENTIONS: 97164- PT Re-evaluation, 97110-Therapeutic exercises, 97530- Therapeutic activity, [id number]- Neuromuscular re-education, 97535- Self Care, 02859- Manual therapy, [id number]- Gait training, [phone]- Orthotic Fit/training, [phone]- Canalith repositioning, [id number]- Aquatic Therapy, [phone]- Splinting, [phone]- Wound care (first 20 sq cm), 97598- Wound care (each additional 20 sq cm)Patient/Family education, Balance training, Stair training, Taping, Dry Needling, Joint mobilization, Joint manipulation, Spinal manipulation, Spinal mobilization, Scar mobilization, and DME instructions.  PLAN FOR NEXT SESSION: plan to progress ankle exercises as tolerated, consider HHD testing, continue gait training   Mose Minerva, PT 11/21/2024, 6:20 AM   This entire session was performed under direct supervision and direction of a licensed therapist/therapist assistant . I have personally read, edited and approve of the note as  written. 6:20 AM, 11/21/24 Prentice CANDIE Stains PT, DPT Physical Therapist at [facility]

## 2024-11-30 ENCOUNTER — Encounter (HOSPITAL_BASED_OUTPATIENT_CLINIC_OR_DEPARTMENT_OTHER): Admitting: Physical Therapy

## 2024-12-08 ENCOUNTER — Telehealth: Payer: Self-pay

## 2024-12-08 NOTE — Telephone Encounter (Signed)
 Alexis Clay

## 2024-12-12 ENCOUNTER — Ambulatory Visit (HOSPITAL_BASED_OUTPATIENT_CLINIC_OR_DEPARTMENT_OTHER): Admitting: Physical Therapy

## 2024-12-12 ENCOUNTER — Telehealth (HOSPITAL_BASED_OUTPATIENT_CLINIC_OR_DEPARTMENT_OTHER): Payer: Self-pay | Admitting: Physical Therapy

## 2024-12-12 NOTE — Telephone Encounter (Signed)
 Called pt to confirm text no for appt- no answer, LMOM about next scheduled visit and asked her to call us  for cancelling/rescheduling needs.  Josette Rough, PT, DPT 12/12/2024 8:22 AM

## 2024-12-13 ENCOUNTER — Encounter: Payer: Self-pay | Admitting: Cardiology

## 2024-12-13 ENCOUNTER — Ambulatory Visit: Attending: Internal Medicine | Admitting: Cardiology

## 2024-12-13 VITALS — BP 120/70 | HR 78 | Ht 66.0 in | Wt 207.6 lb

## 2024-12-13 DIAGNOSIS — I495 Sick sinus syndrome: Secondary | ICD-10-CM | POA: Diagnosis not present

## 2024-12-13 DIAGNOSIS — G4733 Obstructive sleep apnea (adult) (pediatric): Secondary | ICD-10-CM | POA: Diagnosis not present

## 2024-12-13 DIAGNOSIS — I10 Essential (primary) hypertension: Secondary | ICD-10-CM | POA: Diagnosis not present

## 2024-12-13 DIAGNOSIS — I110 Hypertensive heart disease with heart failure: Secondary | ICD-10-CM

## 2024-12-13 DIAGNOSIS — I447 Left bundle-branch block, unspecified: Secondary | ICD-10-CM | POA: Diagnosis not present

## 2024-12-13 DIAGNOSIS — I5032 Chronic diastolic (congestive) heart failure: Secondary | ICD-10-CM

## 2024-12-13 NOTE — Patient Instructions (Addendum)
 Medication Instructions:  Your physician recommends that you continue on your current medications as directed. Please refer to the Current Medication list given to you today.  *If you need a refill on your cardiac medications before your next appointment, please call your pharmacy*  Lab Work: None.  If you have labs (blood work) drawn today and your tests are completely normal, you will receive your results only by: MyChart Message (if you have MyChart) OR A paper copy in the mail If you have any lab test that is abnormal or we need to change your treatment, we will call you to review the results.  Testing/Procedures: Your physician has requested that you have an echocardiogram. Echocardiography is a painless test that uses sound waves to create images of your heart. It provides your doctor with information about the size and shape of your heart and how well your hearts chambers and valves are working. This procedure takes approximately one hour. There are no restrictions for this procedure. Please do NOT wear cologne, perfume, aftershave, or lotions (deodorant is allowed). Please arrive 15 minutes prior to your appointment time.  Please note: We ask at that you not bring children with you during ultrasound (echo/ vascular) testing. Due to room size and safety concerns, children are not allowed in the ultrasound rooms during exams. Our front office staff cannot provide observation of children in our lobby area while testing is being conducted. An adult accompanying a patient to their appointment will only be allowed in the ultrasound room at the discretion of the ultrasound technician under special circumstances. We apologize for any inconvenience.    Follow-Up: At [facility], you and your health needs are our priority.  As part of our continuing mission to provide you with exceptional heart care, our providers are all part of one team.  This team includes your primary Cardiologist  (physician) and Advanced Practice Providers or APPs (Physician Assistants and Nurse Practitioners) who all work together to provide you with the care you need, when you need it.  Your next appointment:   1 year(s)  Provider:   Dr. Wilbert Bihari, MD   We recommend signing up for the patient portal called MyChart.  Sign up information is provided on this After Visit Summary.  MyChart is used to connect with patients for Virtual Visits (Telemedicine).  Patients are able to view lab/test results, encounter notes, upcoming appointments, etc.  Non-urgent messages can be sent to your provider as well.   To learn more about what you can do with MyChart, go to [email or web address].

## 2024-12-13 NOTE — Progress Notes (Signed)
 "    Date:  12/13/2024   ID:  Alexis Clay, DOB August 25, 1966, MRN [id number] PCP:  Lendia Boby CROME, NP-C  Cardiologist:  Victory LELON Claudene DOUGLAS, MD (Inactive)    Referring MD: Lendia Boby CROME, NP-C   Chief Complaint  Patient presents with   Congestive Heart Failure   Cardiomyopathy   Hypertension   Sleep Apnea    History of Present Illness:    Alexis Clay is a 58 y.o. 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  She underwent PSG showing moderate OSA with an AHI of 17/hr and underwent CPAP titration to 12cm H2O. she has a [employer] CRT-P implanted in 2011 with a generator change out 05/12/2024.  Followed in device clinic.  She is here today for follow-up and is doing well.  She denies any chest pain or pressure, SOB, DOE, PND, orthopnea,  dizziness, palpitations or syncope. She has had some RLE edema recently due to an Achilles tendon injury.  She has been struggling with her FFM and says that she does not like it.  She has not tired any other masks..  She feels the pressure is adequate.  Since going on PAP she feels rested in the am and has no significant daytime sleepiness.  She denies any significant mouth or nasal dryness or nasal congestion.    Past Medical History:  Diagnosis Date   Anxiety disorder    Biventricular cardiac pacemaker in situ    Bradycardia    Cardiomyopathy (HCC)    CHF (congestive heart failure) (HCC)    Dilated cardiomyopathy (HCC)    Headache    Hypertension, benign    LBBB (left bundle branch block)    Panic disorder without agoraphobia    Presence of permanent cardiac pacemaker    Sinoatrial node dysfunction (HCC)    Sinus node dysfunction (HCC)    Supraventricular premature beats     SVT (supraventricular tachycardia)    Systolic heart failure (HCC)    Due to hypertension (LVEF 35-55% after med therapy & resynchronizatoin)    Past Surgical History:  Procedure Laterality Date   BIV PACEMAKER GENERATOR CHANGEOUT N/A 07/01/2017   Procedure: BiV Pacemaker Generator Changeout;  Surgeon: Waddell Danelle LELON, MD;  Location: [facility] INVASIVE CV LAB;  Service: Cardiovascular;  Laterality: N/A;   BIV PACEMAKER GENERATOR CHANGEOUT N/A 05/12/2024   Procedure: BIV PACEMAKER GENERATOR CHANGEOUT;  Surgeon: Waddell Danelle LELON, MD;  Location: [facility] INVASIVE CV LAB;  Service: Cardiovascular;  Laterality: N/A;   CARDIAC CATHETERIZATION  2011   With widely patent coronary arteries    CESAREAN SECTION  1991   COLONOSCOPY WITH PROPOFOL  N/A 02/04/2016   Procedure: COLONOSCOPY WITH PROPOFOL ;  Surgeon: Renaye Sous, MD;  Location: [facility] ENDOSCOPY;  Service: Endoscopy;  Laterality: N/A;   LEEP  10/2010   PACEMAKER INSERTION  12/26/2009   St. Jude BiV PM.  Model # [id number].  Serial # [id number]     TUBAL LIGATION      Current Medications: Active Medications[1]   Allergies:   Patient has no known allergies.   Social History   Socioeconomic History   Marital status: Married    Spouse name: Not on file   Number of children: Not on file   Years of education: Not on  file   Highest education level: Not on file  Occupational History   Not on file  Tobacco Use   Smoking status: Never   Smokeless tobacco: Never  Vaping Use   Vaping status: Never Used  Substance and Sexual Activity   Alcohol use: Yes    Comment: occasionally   Drug use: No   Sexual activity: Not on file  Other Topics Concern   Not on file  Social History Narrative   Not on file   Social Drivers of Health   Tobacco Use: Low Risk (12/13/2024)   Patient History    Smoking Tobacco Use: Never    Smokeless Tobacco Use: Never    Passive Exposure: Not on file  Financial Resource Strain: Patient Declined (05/04/2024)   Received from [facility]   Overall Financial Resource Strain (CARDIA)    Difficulty of Paying Living Expenses: Patient declined  Food Insecurity: Patient Declined (05/04/2024)   Received from [facility]   Epic    Within the past 12 months, you worried that your food would run out before you got the money to buy more.: Patient declined    Within the past 12 months, the food you bought just didn't last and you didn't have money to get more.: Patient declined  Transportation Needs: Patient Declined (05/04/2024)   Received from [facility] - Transportation    Lack of Transportation (Medical): Patient declined    Lack of Transportation (Non-Medical): Patient declined  Physical Activity: Insufficiently Active (06/22/2023)   Received from [facility]   Exercise Vital Sign    On average, how many days per week do you engage in moderate to strenuous exercise (like a brisk walk)?: 4 days    On average, how many minutes do you engage in exercise at this level?: 30 min  Stress: Not on file (10/20/2023)  Social Connections: Socially Integrated (06/22/2023)   Received from [facility]   Social Network    How would you rate your social network (family, work, friends)?: Good participation with social networks  Depression (PHQ2-9): Low Risk (05/05/2024)   Depression (PHQ2-9)    PHQ-2 Score: 0  Alcohol Screen: Not on file  Housing: Patient Declined (05/04/2024)   Received from [facility]    In the last 12 months, was there a time when you were not able to pay the mortgage or rent on time?: Patient declined    In the past 12 months, how many times have you moved where you were living?: 1    At any time in the past 12 months, were you homeless or living in a shelter (including now)?: Patient declined  Utilities: Patient Declined (05/04/2024)   Received from [facility] Utilities    Threatened with loss of utilities: Patient declined  Health Literacy: Not on file     Family History: The  patient's family history includes CAD in her father; Cancer in her mother; Congestive Heart Failure in her father; Heart disease in her mother; Hypertension in her brother, brother, father, and sister; Renal Disease in her father.  ROS:   Please see the history of present illness.    ROS  All other systems reviewed and negative.   EKGs/Labs/Other Studies Reviewed:    The following studies were reviewed today: PAP compliance download     Physical Exam:    VS:  BP 120/70   Pulse 78   Ht 5' 6 (1.676 m)   Wt  207 lb 9.6 oz (94.2 kg)   SpO2 97%   BMI 33.51 kg/m     Wt Readings from Last 3 Encounters:  12/13/24 207 lb 9.6 oz (94.2 kg)  11/07/24 206 lb (93.4 kg)  08/15/24 209 lb (94.8 kg)      ASSESSMENT:    1. OSA (obstructive sleep apnea)   2. Left bundle branch block   3. Hypertensive heart disease with chronic diastolic congestive heart failure (HCC)   4. Essential hypertension   5. Sinus node dysfunction (HCC)    PLAN:    In order of problems listed above:  OSA - The patient is tolerating PAP therapy well without any problems. The PAP download performed by his DME was personally reviewed and interpreted by me today and showed an AHI of 1.6 /hr on 12 cm H2O with 10% compliance in using more than 4 hours nightly.  The patient has been using and benefiting from PAP use and will continue to benefit from therapy.  - I encouraged her to be more compliant with her device - I am going to order her an under the nose FFM to see if she tolerates that better  Hypertensive heart disease Left bundle branch block Hypertensive cardiomyopathy with recovered EF Chronic diastolic CHF - 2D echo 07/03/2019 showed EF greater than 65% with moderate LVH, normal RV - Coronary calcium  score 0 on 11/17/2024 - Repeat 2D echo since has been over 5 years - BP controlled on exam today - She appears euvolemic on exam today - Continue Zestoretic  20-25 mg daily, carvedilol  25 mg twice daily with as  needed refills -I have personally reviewed and interpreted outside labs performed by patient's PCP which showed serum creatinine 0.95 and potassium 3.7 11/07/2024  Sinus node dysfunction -S/P SJ CRT-P followed in device clinic with upgrade 05/02/2024  Time Spent: 20 minutes total time of encounter, including 15 minutes spent in face-to-face patient care on the date of this encounter. This time includes coordination of care and counseling regarding above mentioned problem list. Remainder of non-face-to-face time involved reviewing chart documents/testing relevant to the patient encounter and documentation in the medical record. I have independently reviewed documentation from referring provider  Medication Adjustments/Labs and Tests Ordered: Current medicines are reviewed at length with the patient today.  Concerns regarding medicines are outlined above.  Orders Placed This Encounter  Procedures   Magnesium   Basic Metabolic Panel (BMET)   No orders of the defined types were placed in this encounter.  Follow-up with me in 1 year  Signed, Wilbert Bihari, MD  12/13/2024 9:14 AM    [facility]     [1]  Current Meds  Medication Sig   aspirin EC 81 MG tablet Take 81 mg by mouth daily.   atorvastatin  (LIPITOR) 10 MG tablet Take 1 tablet (10 mg total) by mouth daily.   carvedilol  (COREG ) 25 MG tablet TAKE 1 TABLET BY MOUTH TWICE A DAY   lisinopril -hydrochlorothiazide  (ZESTORETIC ) 20-25 MG tablet Take 1 tablet by mouth daily.   WEGOVY 0.5 MG/0.5ML SOAJ SQ injection Inject 0.5 mg into the skin once a week.   "

## 2024-12-13 NOTE — Addendum Note (Signed)
 Addended by: JANIT GENI CROME on: 12/13/2024 09:21 AM   Modules accepted: Orders

## 2024-12-15 ENCOUNTER — Telehealth: Payer: Self-pay | Admitting: *Deleted

## 2024-12-15 DIAGNOSIS — I110 Hypertensive heart disease with heart failure: Secondary | ICD-10-CM

## 2024-12-15 DIAGNOSIS — I10 Essential (primary) hypertension: Secondary | ICD-10-CM

## 2024-12-15 DIAGNOSIS — G4733 Obstructive sleep apnea (adult) (pediatric): Secondary | ICD-10-CM

## 2024-12-15 DIAGNOSIS — I5022 Chronic systolic (congestive) heart failure: Secondary | ICD-10-CM

## 2024-12-15 NOTE — Telephone Encounter (Signed)
-----   Message from Wilbert Bihari, MD sent at 12/13/2024  9:16 AM EST ----- order her an under the nose FFM

## 2024-12-15 NOTE — Telephone Encounter (Signed)
 Order her an under the nose FFM. Patient is in need of a new dme as [facility] is no longer servicing Cpaps. I will send her order to [facility].  Upon patient request DME selection is [facility] Patient understands he will be contacted by [facility] to set up his cpap. Patient understands to call if [facility] does not contact him with new setup in a timely manner. Please add to airview Patient was grateful for the call and thanked me

## 2024-12-22 ENCOUNTER — Ambulatory Visit (HOSPITAL_BASED_OUTPATIENT_CLINIC_OR_DEPARTMENT_OTHER)

## 2024-12-25 ENCOUNTER — Ambulatory Visit (INDEPENDENT_AMBULATORY_CARE_PROVIDER_SITE_OTHER)

## 2024-12-25 DIAGNOSIS — I495 Sick sinus syndrome: Secondary | ICD-10-CM | POA: Diagnosis not present

## 2024-12-26 LAB — CUP PACEART REMOTE DEVICE CHECK
Battery Remaining Longevity: 35 mo
Battery Remaining Percentage: 82 %
Battery Voltage: 2.95 V
Brady Statistic AP VP Percent: 1 %
Brady Statistic AP VS Percent: 1 %
Brady Statistic AS VP Percent: 98 %
Brady Statistic AS VS Percent: 1 %
Brady Statistic RA Percent Paced: 1 %
Date Time Interrogation Session: 20260112020014
Implantable Lead Connection Status: 753985
Implantable Lead Connection Status: 753985
Implantable Lead Connection Status: 753985
Implantable Lead Implant Date: 20110113
Implantable Lead Implant Date: 20110113
Implantable Lead Implant Date: 20110113
Implantable Lead Location: 753858
Implantable Lead Location: 753859
Implantable Lead Location: 753860
Implantable Pulse Generator Implant Date: 20250530
Lead Channel Impedance Value: 260 Ohm
Lead Channel Impedance Value: 440 Ohm
Lead Channel Impedance Value: 450 Ohm
Lead Channel Pacing Threshold Amplitude: 0.75 V
Lead Channel Pacing Threshold Amplitude: 1.125 V
Lead Channel Pacing Threshold Amplitude: 1.625 V
Lead Channel Pacing Threshold Pulse Width: 0.4 ms
Lead Channel Pacing Threshold Pulse Width: 0.5 ms
Lead Channel Pacing Threshold Pulse Width: 1 ms
Lead Channel Sensing Intrinsic Amplitude: 12 mV
Lead Channel Sensing Intrinsic Amplitude: 3.6 mV
Lead Channel Setting Pacing Amplitude: 2.125
Lead Channel Setting Pacing Amplitude: 2.5 V
Lead Channel Setting Pacing Amplitude: 2.625
Lead Channel Setting Pacing Pulse Width: 0.5 ms
Lead Channel Setting Pacing Pulse Width: 1 ms
Lead Channel Setting Sensing Sensitivity: 2 mV
Pulse Gen Model: 3222
Pulse Gen Serial Number: 8185260

## 2024-12-29 ENCOUNTER — Ambulatory Visit (HOSPITAL_BASED_OUTPATIENT_CLINIC_OR_DEPARTMENT_OTHER): Attending: Student

## 2024-12-29 ENCOUNTER — Encounter (HOSPITAL_BASED_OUTPATIENT_CLINIC_OR_DEPARTMENT_OTHER): Payer: Self-pay

## 2024-12-29 DIAGNOSIS — M25571 Pain in right ankle and joints of right foot: Secondary | ICD-10-CM | POA: Insufficient documentation

## 2024-12-29 DIAGNOSIS — R2689 Other abnormalities of gait and mobility: Secondary | ICD-10-CM | POA: Diagnosis present

## 2024-12-29 DIAGNOSIS — M25671 Stiffness of right ankle, not elsewhere classified: Secondary | ICD-10-CM | POA: Diagnosis present

## 2024-12-29 DIAGNOSIS — M6281 Muscle weakness (generalized): Secondary | ICD-10-CM | POA: Insufficient documentation

## 2024-12-29 NOTE — Therapy (Signed)
 " OUTPATIENT PHYSICAL THERAPY LOWER EXTREMITY TREATMENT   Patient Name: Peni Rupard MRN: [id number] DOB:May 19, 1966, 59 y.o., female Today's Date: 12/29/2024  END OF SESSION:  PT End of Session - 12/29/24 0805     Visit Number 3    Number of Visits 16    Date for Recertification  01/11/25    Authorization Type [employer]    Authorization - Visit Number 1    Authorization - Number of Visits 60    Progress Note Due on Visit 10    PT Start Time 0802    PT Stop Time 0842    PT Time Calculation (min) 40 min    Activity Tolerance Patient tolerated treatment well    Behavior During Therapy WFL for tasks assessed/performed            Past Medical History:  Diagnosis Date   Anxiety disorder    Biventricular cardiac pacemaker in situ    Bradycardia    Cardiomyopathy (HCC)    CHF (congestive heart failure) (HCC)    Dilated cardiomyopathy (HCC)    Headache    Hypertension, benign    LBBB (left bundle branch block)    Panic disorder without agoraphobia    Presence of permanent cardiac pacemaker    Sinoatrial node dysfunction (HCC)    Sinus node dysfunction (HCC)    Supraventricular premature beats    SVT (supraventricular tachycardia)    Systolic heart failure (HCC)    Due to hypertension (LVEF 35-55% after med therapy & resynchronizatoin)   Past Surgical History:  Procedure Laterality Date   BIV PACEMAKER GENERATOR CHANGEOUT N/A 07/01/2017   Procedure: BiV Pacemaker Generator Changeout;  Surgeon: Waddell Danelle ORN, MD;  Location: [facility] INVASIVE CV LAB;  Service: Cardiovascular;  Laterality: N/A;   BIV PACEMAKER GENERATOR CHANGEOUT N/A 05/12/2024   Procedure: BIV PACEMAKER GENERATOR CHANGEOUT;  Surgeon: Waddell Danelle ORN, MD;  Location: [facility] INVASIVE CV LAB;  Service: Cardiovascular;  Laterality: N/A;   CARDIAC CATHETERIZATION  2011   With widely patent coronary arteries    CESAREAN SECTION  1991   COLONOSCOPY WITH PROPOFOL  N/A 02/04/2016   Procedure: COLONOSCOPY WITH PROPOFOL ;   Surgeon: Renaye Sous, MD;  Location: [facility];  Service: Endoscopy;  Laterality: N/A;   LEEP  10/2010   PACEMAKER INSERTION  12/26/2009   [employer] Jude BiV PM.  Model # [id number].  Serial # [id number]     TUBAL LIGATION     Patient Active Problem List   Diagnosis Date Noted   Elevated fasting glucose 05/05/2024   At increased risk for cardiovascular disease 05/04/2024   Vitamin D deficiency 06/22/2023   Hyperlipidemia 06/22/2023   Congestive heart failure (HCC) 06/22/2023   Dermatitis 11/18/2022   Sinoatrial node dysfunction (HCC)    OSA on CPAP 09/03/2020   Sinus node dysfunction (HCC) 04/02/2020   Heart disease 08/09/2019   Snoring 11/13/2015   Biventricular cardiac pacemaker in situ 08/16/2014   Chronic systolic heart failure (HCC) 08/16/2014   Essential hypertension 08/16/2014    PCP: Lendia Boby CROME, NP-C  REFERRING PROVIDER: Emiliano Leonce CROME, PA-C  REFERRING DIAG: [phone] (ICD-10-CM) - Achilles tendon injury, right, initial encounter  THERAPY DIAG:  Pain in right ankle and joints of right foot  Stiffness of right ankle, not elsewhere classified  Other abnormalities of gait and mobility  Muscle weakness (generalized)  Rationale for Evaluation and Treatment: Rehabilitation  ONSET DATE: Roughly 8 weeks ago  SUBJECTIVE:   SUBJECTIVE STATEMENT:  Pt denies pain with ambulation, but  is limited by tightness. She feels like it loosens up after walking for a bit.    Previous: Pt is 8 weeks and 4 days s/p injury (DOS 09/22/24).  Pt wore the boot for 6 weeks and also used heel lifts.  Pt is not using the heel lifts currently.  Pt does get a little pain when trying to walk normally.  Pt denies pain currently sitting at rest.  Pt denies any adverse effects after prior treatment.  Pt reports compliance with HEP and is not having issues with HEP.  Pt reports a little pain with HEP.  She has been doing doing  Reports she had an injury back in October and she has been in a boot  for several weeks now. Reports she has a partial achilles tear. Reports she used to do a lpot of walking and she doesn't have her walk strike back. Still limited by pain.   PERTINENT HISTORY: Anxiety, cardiac pacemaker, bradycardia, cardiomyopathy, CHF, hypertension, LBBB, panic disorder, SA Node dysfunction, supraventricular premature beats, SVT, systolic heart failure  PAIN:  Are you having pain? No  PRECAUTIONS: None  WEIGHT BEARING RESTRICTIONS: Yes MD note indicated PT will help facilitate discontinuation of the walking boot.   FALLS:  Has patient fallen in last 6 months? No  OCCUPATION: [employer]   PLOF: Independent  PATIENT GOALS: Walking  NEXT MD VISIT:   OBJECTIVE: (objective measures from initial evaluation unless otherwise dated)  PATIENT SURVEYS:  LEFS  Extreme difficulty/unable (0), Quite a bit of difficulty (1), Moderate difficulty (2), Little difficulty (3), No difficulty (4) Survey date:  Eval   Any of your usual work, housework or school activities 4  2. Usual hobbies, recreational or sporting activities 2  3. Getting into/out of the bath 4  4. Walking between rooms 3  5. Putting on socks/shoes 3  6. Squatting  4  7. Lifting an object, like a bag of groceries from the floor 4  8. Performing light activities around your home 4  9. Performing heavy activities around your home 4  10. Getting into/out of a car 4  11. Walking 2 blocks 1  12. Walking 1 mile 1  13. Going up/down 10 stairs (1 flight) 3  14. Standing for 1 hour 4  15.  sitting for 1 hour 4  16. Running on even ground 1  17. Running on uneven ground 1  18. Making sharp turns while running fast 1  19. Hopping  1  20. Rolling over in bed 4  Score total:  56/80     COGNITION: Overall cognitive status: Within functional limits for tasks assessed     SENSATION: WFL  POSTURE: No Significant postural limitations  PALPATION: TTP to right achilles  LOWER EXTREMITY MMT:  MMT Right eval  Left eval  Hip flexion 5 5  Hip extension    Hip abduction (seated) 5 5  Hip adduction (seated)  5 5  Hip internal rotation    Hip external rotation    Knee flexion 5 5  Knee extension 5 5  Ankle dorsiflexion 5 5  Ankle plantarflexion Calf raise difficult    Ankle inversion    Ankle eversion     (Blank rows = not tested) *= pain/symptoms  LOWER EXTREMITY ROM:  Active ROM Right eval Left eval  Hip flexion    Hip extension    Hip abduction    Hip adduction    Hip internal rotation    Hip external rotation  Knee flexion    Knee extension    Ankle dorsiflexion 4 9  Ankle plantarflexion 30 35  Ankle inversion 35 42  Ankle eversion 20 18   (Blank rows = not tested) *= pain/symptoms  FUNCTIONAL TESTS:  5 times sit to stand: 8.58 seconds  GAIT: Distance walked: 50 ft from lobby to treatment room, 50 ft in hallway  Assistive device utilized: None Level of assistance: Complete Independence Comments: ambulates with early heel off on right ankle, antalgic gait  Stairs: difficulty and pain in ankle when descending stairs, early heel off, one step at a time but  able to reciprocally navigate with increased time and compensation    TODAY'S TREATMENT:                                                                                                                              DATE:    12/29/24: IASTM and STM to R triceps surae (roller and hawk grips) prone position Standing gastroc stretch 30sec x4 Standing soleus stretch 30sec x4  Standing HR 2x10 Standing TR 2x10 Standing HR with hip ER 2x10 Standing HR with knees flexed 2x10 Gait in hall x172ft HEP update/review   11/21/24: Reviewed current function, pain level, HEP compliance, and response to prior treatment.  DLS: Symmetrical Wb'ing, No pain Gait:  Pt ambulated without boot.  Pt favors R LE though had no significant limp.  Pt has limited toe off.  Pt reports 3-4/10 with ambulation though pain is less when she  walks slower.   Scifit recumbent bike x Ankle Eve and Inv with RTB 3x10 each Ankle PF with YTB 3x10 Ankle DF not past neutral with YTB x 10, RTB 2x10 Seated heel raises 2x10 Supine SLR with ankle relaxed 2x10 PT reviewed HEP  PT educated pt concerning healing and instructed pt to not perform any exercise into a range that causes her to feel a pulling or pain at the achilles.    11/16/24  Evaluation  4 way ankle x10 Calf raises x10 Gait assessment and stair navigation   PATIENT EDUCATION:  Education details: Patient educated on exam findings, POC, scope of PT, HEP, relevant anatomy and biomechanics, and activity tolerance. Person educated: Patient Education method: Explanation, Demonstration, and Handouts Education comprehension: verbalized understanding, returned demonstration, verbal cues required, and tactile cues required  HOME EXERCISE PROGRAM: Access Code: [email or web address] URL: [email or web address] Date: 11/16/2024 Prepared by: Lili Finder  Exercises - Long Sitting Ankle Eversion with Resistance  - 1 x daily - 7 x weekly - 3 sets - 10 reps - Long Sitting Ankle Plantar Flexion with Resistance  - 1 x daily - 7 x weekly - 3 sets - 10 reps - Long Sitting Ankle Dorsiflexion with Anchored Resistance  - 1 x daily - 7 x weekly - 3 sets - 10 reps - Long Sitting Ankle Inversion with Resistance  - 1 x daily - 7 x weekly - 3 sets - 10 reps -  Heel Raises with Counter Support  - 1 x daily - 7 x weekly - 3 sets - 10 reps  ASSESSMENT:  CLINICAL IMPRESSION: Distal gastroc/soleus palpably tight. Spent time on manual techniques including IASTM and STM to decrease tightness here. Educated on self performance of this with HEP. She felt improved mobility following this when walking in hallway. No onset of discomfort with any stretching or exercise. Updated HEP to reflct progressions. Will continue to progress as tolerated.    OBJECTIVE IMPAIRMENTS: Abnormal gait, decreased  activity tolerance, decreased balance, decreased endurance, decreased mobility, difficulty walking, decreased ROM, decreased strength, increased muscle spasms, impaired flexibility, improper body mechanics, and pain  ACTIVITY LIMITATIONS: lifting, bending, standing, squatting, stairs, transfers, locomotion level, and caring for others  PARTICIPATION LIMITATIONS: meal prep, cleaning, laundry, shopping, community activity, occupation, and yard work  PERSONAL FACTORS: 3+ comorbidities: Anxiety, cardiac pacemaker, bradycardia, cardiomyopathy, CHF, hypertension, LBBB, panic disorder, SA Node dysfunction, supraventricular premature beats, SVT, systolic heart failure are also affecting patient's functional outcome.   REHAB POTENTIAL: Good  CLINICAL DECISION MAKING: Evolving/moderate complexity  EVALUATION COMPLEXITY: Moderate   GOALS: Goals reviewed with patient? Yes  SHORT TERM GOALS: Target date: 11/29/2024  Patient will be independent with HEP in order to improve functional outcomes. Baseline: Goal status: INITIAL  2.  Patient will report at least 25% improvement in symptoms for improved quality of life. Baseline: Goal status: INITIAL   LONG TERM GOALS: Target date: 01/11/2025  Patient will report at least 75% improvement in symptoms for improved quality of life. Baseline:  Goal status: INITIAL  2.  Patient will improve LEFS score by at least 9 points in order to indicate improved tolerance to activity. Baseline:  Goal status: INITIAL  3.  Patient will be able to navigate stairs with reciprocal pattern without compensation in order to demonstrate improved LE strength. Baseline:  Goal status: INITIAL  4.  Patient will ambulate 50 ft in hallway with reciprocal gait pattern and no compensation to indicate improvement in activity tolerance and functional mobility.  Baseline:  Goal status: INITIAL  PLAN:  PT FREQUENCY: 1x/week  PT DURATION: 8 weeks  PLANNED INTERVENTIONS:  [id number]- PT Re-evaluation, 97110-Therapeutic exercises, 97530- Therapeutic activity, [id number]- Neuromuscular re-education, 97535- Self Care, [id number]- Manual therapy, [id number]- Gait training, [phone]- Orthotic Fit/training, [phone]- Canalith repositioning, [id number]- Aquatic Therapy, [phone]- Splinting, [phone]- Wound care (first 20 sq cm), 97598- Wound care (each additional 20 sq cm)Patient/Family education, Balance training, Stair training, Taping, Dry Needling, Joint mobilization, Joint manipulation, Spinal manipulation, Spinal mobilization, Scar mobilization, and DME instructions.  PLAN FOR NEXT SESSION: plan to progress ankle exercises as tolerated per dx, continue gait training   Asberry Rodes, PTA  12/29/24 9:12 AM   "

## 2024-12-29 NOTE — Progress Notes (Signed)
 Remote PPM Transmission

## 2025-01-05 ENCOUNTER — Ambulatory Visit (HOSPITAL_BASED_OUTPATIENT_CLINIC_OR_DEPARTMENT_OTHER)

## 2025-01-05 ENCOUNTER — Encounter (HOSPITAL_BASED_OUTPATIENT_CLINIC_OR_DEPARTMENT_OTHER): Payer: Self-pay

## 2025-01-05 DIAGNOSIS — R2689 Other abnormalities of gait and mobility: Secondary | ICD-10-CM

## 2025-01-05 DIAGNOSIS — M25571 Pain in right ankle and joints of right foot: Secondary | ICD-10-CM

## 2025-01-05 DIAGNOSIS — M6281 Muscle weakness (generalized): Secondary | ICD-10-CM

## 2025-01-05 DIAGNOSIS — M25671 Stiffness of right ankle, not elsewhere classified: Secondary | ICD-10-CM

## 2025-01-05 NOTE — Therapy (Signed)
 " OUTPATIENT PHYSICAL THERAPY LOWER EXTREMITY TREATMENT   Patient Name: Alexis Clay MRN: [id number] DOB:Nov 24, 1966, 59 y.o., female Today's Date: 01/05/2025  END OF SESSION:  PT End of Session - 01/05/25 0806     Visit Number 4    Number of Visits 16    Date for Recertification  01/11/25    Authorization Type [employer]    Authorization - Visit Number 2    Authorization - Number of Visits 60    Progress Note Due on Visit 10    PT Start Time 0805    PT Stop Time 0843    PT Time Calculation (min) 38 min    Activity Tolerance Patient tolerated treatment well    Behavior During Therapy WFL for tasks assessed/performed             Past Medical History:  Diagnosis Date   Anxiety disorder    Biventricular cardiac pacemaker in situ    Bradycardia    Cardiomyopathy (HCC)    CHF (congestive heart failure) (HCC)    Dilated cardiomyopathy (HCC)    Headache    Hypertension, benign    LBBB (left bundle branch block)    Panic disorder without agoraphobia    Presence of permanent cardiac pacemaker    Sinoatrial node dysfunction (HCC)    Sinus node dysfunction (HCC)    Supraventricular premature beats    SVT (supraventricular tachycardia)    Systolic heart failure (HCC)    Due to hypertension (LVEF 35-55% after med therapy & resynchronizatoin)   Past Surgical History:  Procedure Laterality Date   BIV PACEMAKER GENERATOR CHANGEOUT N/A 07/01/2017   Procedure: BiV Pacemaker Generator Changeout;  Surgeon: Waddell Danelle ORN, MD;  Location: [facility] INVASIVE CV LAB;  Service: Cardiovascular;  Laterality: N/A;   BIV PACEMAKER GENERATOR CHANGEOUT N/A 05/12/2024   Procedure: BIV PACEMAKER GENERATOR CHANGEOUT;  Surgeon: Waddell Danelle ORN, MD;  Location: [facility] INVASIVE CV LAB;  Service: Cardiovascular;  Laterality: N/A;   CARDIAC CATHETERIZATION  2011   With widely patent coronary arteries    CESAREAN SECTION  1991   COLONOSCOPY WITH PROPOFOL  N/A 02/04/2016   Procedure: COLONOSCOPY WITH PROPOFOL ;   Surgeon: Renaye Sous, MD;  Location: [facility];  Service: Endoscopy;  Laterality: N/A;   LEEP  10/2010   PACEMAKER INSERTION  12/26/2009   [employer] Jude BiV PM.  Model # [id number].  Serial # [id number]     TUBAL LIGATION     Patient Active Problem List   Diagnosis Date Noted   Elevated fasting glucose 05/05/2024   At increased risk for cardiovascular disease 05/04/2024   Vitamin D deficiency 06/22/2023   Hyperlipidemia 06/22/2023   Congestive heart failure (HCC) 06/22/2023   Dermatitis 11/18/2022   Sinoatrial node dysfunction (HCC)    OSA on CPAP 09/03/2020   Sinus node dysfunction (HCC) 04/02/2020   Heart disease 08/09/2019   Snoring 11/13/2015   Biventricular cardiac pacemaker in situ 08/16/2014   Chronic systolic heart failure (HCC) 08/16/2014   Essential hypertension 08/16/2014    PCP: Lendia Boby CROME, NP-C  REFERRING PROVIDER: Emiliano Leonce CROME, PA-C  REFERRING DIAG: [phone] (ICD-10-CM) - Achilles tendon injury, right, initial encounter  THERAPY DIAG:  Pain in right ankle and joints of right foot  Stiffness of right ankle, not elsewhere classified  Other abnormalities of gait and mobility  Muscle weakness (generalized)  Rationale for Evaluation and Treatment: Rehabilitation  ONSET DATE: Roughly 8 weeks ago  SUBJECTIVE:   SUBJECTIVE STATEMENT:  Pt denies pain with ambulation,  but is limited by tightness. She feels like it loosens up after walking for a bit.    Previous: Pt is 8 weeks and 4 days s/p injury (DOS 09/22/24).  Pt wore the boot for 6 weeks and also used heel lifts.  Pt is not using the heel lifts currently.  Pt does get a little pain when trying to walk normally.  Pt denies pain currently sitting at rest.  Pt denies any adverse effects after prior treatment.  Pt reports compliance with HEP and is not having issues with HEP.  Pt reports a little pain with HEP.  She has been doing doing  Reports she had an injury back in October and she has been in a boot  for several weeks now. Reports she has a partial achilles tear. Reports she used to do a lpot of walking and she doesn't have her walk strike back. Still limited by pain.   PERTINENT HISTORY: Anxiety, cardiac pacemaker, bradycardia, cardiomyopathy, CHF, hypertension, LBBB, panic disorder, SA Node dysfunction, supraventricular premature beats, SVT, systolic heart failure  PAIN:  Are you having pain? No  PRECAUTIONS: None  WEIGHT BEARING RESTRICTIONS: Yes MD note indicated PT will help facilitate discontinuation of the walking boot.   FALLS:  Has patient fallen in last 6 months? No  OCCUPATION: [employer]   PLOF: Independent  PATIENT GOALS: Walking  NEXT MD VISIT:   OBJECTIVE: (objective measures from initial evaluation unless otherwise dated)  PATIENT SURVEYS:  LEFS  Extreme difficulty/unable (0), Quite a bit of difficulty (1), Moderate difficulty (2), Little difficulty (3), No difficulty (4) Survey date:  Eval   Any of your usual work, housework or school activities 4  2. Usual hobbies, recreational or sporting activities 2  3. Getting into/out of the bath 4  4. Walking between rooms 3  5. Putting on socks/shoes 3  6. Squatting  4  7. Lifting an object, like a bag of groceries from the floor 4  8. Performing light activities around your home 4  9. Performing heavy activities around your home 4  10. Getting into/out of a car 4  11. Walking 2 blocks 1  12. Walking 1 mile 1  13. Going up/down 10 stairs (1 flight) 3  14. Standing for 1 hour 4  15.  sitting for 1 hour 4  16. Running on even ground 1  17. Running on uneven ground 1  18. Making sharp turns while running fast 1  19. Hopping  1  20. Rolling over in bed 4  Score total:  56/80     COGNITION: Overall cognitive status: Within functional limits for tasks assessed     SENSATION: WFL  POSTURE: No Significant postural limitations  PALPATION: TTP to right achilles  LOWER EXTREMITY MMT:  MMT Right eval  Left eval  Hip flexion 5 5  Hip extension    Hip abduction (seated) 5 5  Hip adduction (seated)  5 5  Hip internal rotation    Hip external rotation    Knee flexion 5 5  Knee extension 5 5  Ankle dorsiflexion 5 5  Ankle plantarflexion Calf raise difficult    Ankle inversion    Ankle eversion     (Blank rows = not tested) *= pain/symptoms  LOWER EXTREMITY ROM:  Active ROM Right eval Left eval  Hip flexion    Hip extension    Hip abduction    Hip adduction    Hip internal rotation    Hip external rotation  Knee flexion    Knee extension    Ankle dorsiflexion 4 9  Ankle plantarflexion 30 35  Ankle inversion 35 42  Ankle eversion 20 18   (Blank rows = not tested) *= pain/symptoms  FUNCTIONAL TESTS:  5 times sit to stand: 8.58 seconds  GAIT: Distance walked: 50 ft from lobby to treatment room, 50 ft in hallway  Assistive device utilized: None Level of assistance: Complete Independence Comments: ambulates with early heel off on right ankle, antalgic gait  Stairs: difficulty and pain in ankle when descending stairs, early heel off, one step at a time but  able to reciprocally navigate with increased time and compensation    TODAY'S TREATMENT:                                                                                                                              DATE:   01/05/25: IASTM and STM to R triceps surae (roller and hawk grips) prone position Standing gastroc stretch 30sec x4 Standing soleus stretch 30sec x4  Standing HR 2x10 Standing TR 2x10 Standing HR with hip ER 2x10 Standing HR with knees flexed 2x10 Standing hip flexor stretch foot on stool 2x30sec each Thomas stretch 30sec ea Gait in hall 2x158ft Standing hip extension 2x10ea HEP update/review  12/29/24: IASTM and STM to R triceps surae (roller and hawk grips) prone position Standing gastroc stretch 30sec x4 Standing soleus stretch 30sec x4  Standing HR 2x10 Standing TR 2x10 Standing HR  with hip ER 2x10 Standing HR with knees flexed 2x10 Gait in hall x144ft HEP update/review   11/21/24: Reviewed current function, pain level, HEP compliance, and response to prior treatment.  DLS: Symmetrical Wb'ing, No pain Gait:  Pt ambulated without boot.  Pt favors R LE though had no significant limp.  Pt has limited toe off.  Pt reports 3-4/10 with ambulation though pain is less when she walks slower.   Scifit recumbent bike x Ankle Eve and Inv with RTB 3x10 each Ankle PF with YTB 3x10 Ankle DF not past neutral with YTB x 10, RTB 2x10 Seated heel raises 2x10 Supine SLR with ankle relaxed 2x10 PT reviewed HEP  PT educated pt concerning healing and instructed pt to not perform any exercise into a range that causes her to feel a pulling or pain at the achilles.    11/16/24  Evaluation  4 way ankle x10 Calf raises x10 Gait assessment and stair navigation   PATIENT EDUCATION:  Education details: Patient educated on exam findings, POC, scope of PT, HEP, relevant anatomy and biomechanics, and activity tolerance. Person educated: Patient Education method: Explanation, Demonstration, and Handouts Education comprehension: verbalized understanding, returned demonstration, verbal cues required, and tactile cues required  HOME EXERCISE PROGRAM: Access Code: [email or web address] URL: [email or web address] Date: 11/16/2024 Prepared by: Lili Finder  Exercises - Long Sitting Ankle Eversion with Resistance  - 1 x daily - 7 x weekly - 3 sets - 10 reps - Long  Sitting Ankle Plantar Flexion with Resistance  - 1 x daily - 7 x weekly - 3 sets - 10 reps - Long Sitting Ankle Dorsiflexion with Anchored Resistance  - 1 x daily - 7 x weekly - 3 sets - 10 reps - Long Sitting Ankle Inversion with Resistance  - 1 x daily - 7 x weekly - 3 sets - 10 reps - Heel Raises with Counter Support  - 1 x daily - 7 x weekly - 3 sets - 10 reps  ASSESSMENT:  CLINICAL IMPRESSION: Pt demonstrates  anterior pelvic tilt in natural stance and with gait. Performed hip flexor stretches to decrease this as well as hip extension with focus on [facility] for PPT. Continued with manual therapy to decrease restrictions in R triceps surae. Pt progressing well. Would benefit from additional PT to improve gait quality and LE strengthening.    OBJECTIVE IMPAIRMENTS: Abnormal gait, decreased activity tolerance, decreased balance, decreased endurance, decreased mobility, difficulty walking, decreased ROM, decreased strength, increased muscle spasms, impaired flexibility, improper body mechanics, and pain  ACTIVITY LIMITATIONS: lifting, bending, standing, squatting, stairs, transfers, locomotion level, and caring for others  PARTICIPATION LIMITATIONS: meal prep, cleaning, laundry, shopping, community activity, occupation, and yard work  PERSONAL FACTORS: 3+ comorbidities: Anxiety, cardiac pacemaker, bradycardia, cardiomyopathy, CHF, hypertension, LBBB, panic disorder, SA Node dysfunction, supraventricular premature beats, SVT, systolic heart failure are also affecting patient's functional outcome.   REHAB POTENTIAL: Good  CLINICAL DECISION MAKING: Evolving/moderate complexity  EVALUATION COMPLEXITY: Moderate   GOALS: Goals reviewed with patient? Yes  SHORT TERM GOALS: Target date: 11/29/2024  Patient will be independent with HEP in order to improve functional outcomes. Baseline: Goal status: INITIAL  2.  Patient will report at least 25% improvement in symptoms for improved quality of life. Baseline: Goal status: INITIAL   LONG TERM GOALS: Target date: 01/11/2025  Patient will report at least 75% improvement in symptoms for improved quality of life. Baseline:  Goal status: INITIAL  2.  Patient will improve LEFS score by at least 9 points in order to indicate improved tolerance to activity. Baseline:  Goal status: INITIAL  3.  Patient will be able to navigate stairs with reciprocal pattern  without compensation in order to demonstrate improved LE strength. Baseline:  Goal status: INITIAL  4.  Patient will ambulate 50 ft in hallway with reciprocal gait pattern and no compensation to indicate improvement in activity tolerance and functional mobility.  Baseline:  Goal status: INITIAL  PLAN:  PT FREQUENCY: 1x/week  PT DURATION: 8 weeks  PLANNED INTERVENTIONS: 97164- PT Re-evaluation, 97110-Therapeutic exercises, 97530- Therapeutic activity, [id number]- Neuromuscular re-education, 97535- Self Care, [id number]- Manual therapy, [id number]- Gait training, [phone]- Orthotic Fit/training, [phone]- Canalith repositioning, [id number]- Aquatic Therapy, [phone]- Splinting, [phone]- Wound care (first 20 sq cm), 97598- Wound care (each additional 20 sq cm)Patient/Family education, Balance training, Stair training, Taping, Dry Needling, Joint mobilization, Joint manipulation, Spinal manipulation, Spinal mobilization, Scar mobilization, and DME instructions.  PLAN FOR NEXT SESSION: plan to progress ankle exercises as tolerated per dx, continue gait training   Asberry Rodes, PTA  01/05/25 8:45 AM   "

## 2025-01-12 ENCOUNTER — Ambulatory Visit (HOSPITAL_BASED_OUTPATIENT_CLINIC_OR_DEPARTMENT_OTHER)

## 2025-01-18 ENCOUNTER — Ambulatory Visit (HOSPITAL_COMMUNITY)
Admission: RE | Admit: 2025-01-18 | Discharge: 2025-01-18 | Disposition: A | Source: Ambulatory Visit | Attending: Cardiology | Admitting: Cardiology

## 2025-01-18 DIAGNOSIS — I10 Essential (primary) hypertension: Secondary | ICD-10-CM

## 2025-01-18 DIAGNOSIS — I110 Hypertensive heart disease with heart failure: Secondary | ICD-10-CM

## 2025-01-18 LAB — ECHOCARDIOGRAM COMPLETE
Area-P 1/2: 3.72 cm2
S' Lateral: 3.3 cm

## 2025-01-19 ENCOUNTER — Ambulatory Visit (HOSPITAL_BASED_OUTPATIENT_CLINIC_OR_DEPARTMENT_OTHER): Attending: Student | Admitting: Physical Therapy

## 2025-01-19 ENCOUNTER — Encounter (HOSPITAL_BASED_OUTPATIENT_CLINIC_OR_DEPARTMENT_OTHER): Payer: Self-pay | Admitting: Physical Therapy

## 2025-01-19 DIAGNOSIS — M25571 Pain in right ankle and joints of right foot: Secondary | ICD-10-CM

## 2025-01-19 DIAGNOSIS — M25671 Stiffness of right ankle, not elsewhere classified: Secondary | ICD-10-CM

## 2025-01-19 DIAGNOSIS — M6281 Muscle weakness (generalized): Secondary | ICD-10-CM

## 2025-01-19 DIAGNOSIS — R2689 Other abnormalities of gait and mobility: Secondary | ICD-10-CM

## 2025-01-19 NOTE — Therapy (Signed)
 " OUTPATIENT PHYSICAL THERAPY LOWER EXTREMITY TREATMENT   Patient Name: Alexis Clay MRN: [id number] DOB:1965-12-24, 59 y.o., female Today's Date: 01/19/2025 PHYSICAL THERAPY DISCHARGE SUMMARY  Visits from Start of Care: 5  Current functional level related to goals / functional outcomes: See below   Remaining deficits: See below   Education / Equipment: See below   Patient agrees to discharge. Patient goals were met. Patient is being discharged due to meeting the stated rehab goals.  Progress Note   Reporting Period 11/16/24 to 01/19/25   See note below for Objective Data and Assessment of Progress/Goals   END OF SESSION:  PT End of Session - 01/19/25 0810     Visit Number 5    Number of Visits 16    Date for Recertification  01/11/25    Authorization Type [employer]    Authorization - Visit Number 3    Authorization - Number of Visits 60    Progress Note Due on Visit 10    PT Start Time 0810   arrives late/delayed check in   PT Stop Time 0840    PT Time Calculation (min) 30 min    Activity Tolerance Patient tolerated treatment well    Behavior During Therapy WFL for tasks assessed/performed             Past Medical History:  Diagnosis Date   Anxiety disorder    Biventricular cardiac pacemaker in situ    Bradycardia    Cardiomyopathy (HCC)    CHF (congestive heart failure) (HCC)    Dilated cardiomyopathy (HCC)    Headache    Hypertension, benign    LBBB (left bundle branch block)    Panic disorder without agoraphobia    Presence of permanent cardiac pacemaker    Sinoatrial node dysfunction (HCC)    Sinus node dysfunction (HCC)    Supraventricular premature beats    SVT (supraventricular tachycardia)    Systolic heart failure (HCC)    Due to hypertension (LVEF 35-55% after med therapy & resynchronizatoin)   Past Surgical History:  Procedure Laterality Date   BIV PACEMAKER GENERATOR CHANGEOUT N/A 07/01/2017   Procedure: BiV Pacemaker Generator  Changeout;  Surgeon: Waddell Danelle ORN, MD;  Location: [facility] INVASIVE CV LAB;  Service: Cardiovascular;  Laterality: N/A;   BIV PACEMAKER GENERATOR CHANGEOUT N/A 05/12/2024   Procedure: BIV PACEMAKER GENERATOR CHANGEOUT;  Surgeon: Waddell Danelle ORN, MD;  Location: [facility] INVASIVE CV LAB;  Service: Cardiovascular;  Laterality: N/A;   CARDIAC CATHETERIZATION  2011   With widely patent coronary arteries    CESAREAN SECTION  1991   COLONOSCOPY WITH PROPOFOL  N/A 02/04/2016   Procedure: COLONOSCOPY WITH PROPOFOL ;  Surgeon: Renaye Sous, MD;  Location: [facility] ENDOSCOPY;  Service: Endoscopy;  Laterality: N/A;   LEEP  10/2010   PACEMAKER INSERTION  12/26/2009   St[employer] Jude BiV PM.  Model # [id number].  Serial # [id number]     TUBAL LIGATION     Patient Active Problem List   Diagnosis Date Noted   Elevated fasting glucose 05/05/2024   At increased risk for cardiovascular disease 05/04/2024   Vitamin D deficiency 06/22/2023   Hyperlipidemia 06/22/2023   Congestive heart failure (HCC) 06/22/2023   Dermatitis 11/18/2022   Sinoatrial node dysfunction (HCC)    OSA on CPAP 09/03/2020   Sinus node dysfunction (HCC) 04/02/2020   Heart disease 08/09/2019   Snoring 11/13/2015   Biventricular cardiac pacemaker in situ 08/16/2014   Chronic systolic heart failure (HCC) 08/16/2014   Essential hypertension 08/16/2014  PCP: Lendia Boby CROME, NP-C  REFERRING PROVIDER: Emiliano Leonce CROME, PA-C  REFERRING DIAG: [phone] (ICD-10-CM) - Achilles tendon injury, right, initial encounter  THERAPY DIAG:  Pain in right ankle and joints of right foot  Stiffness of right ankle, not elsewhere classified  Other abnormalities of gait and mobility  Muscle weakness (generalized)  Rationale for Evaluation and Treatment: Rehabilitation  ONSET DATE: Roughly 8 weeks ago  SUBJECTIVE:   SUBJECTIVE STATEMENT:  Pt reports things feel like things are getting better. Pain with certain shoes. Feels tightness in it and stretches constantly.  Patient states 75% functional status/improvement. Symptoms with walking longer distances/a long day. HEP going well. Feels possibly ready to transition to HEP.    Previous: Pt is 8 weeks and 4 days s/p injury (DOS 09/22/24).  Pt wore the boot for 6 weeks and also used heel lifts.  Pt is not using the heel lifts currently.  Pt does get a little pain when trying to walk normally.  Pt denies pain currently sitting at rest.  Pt denies any adverse effects after prior treatment.  Pt reports compliance with HEP and is not having issues with HEP.  Pt reports a little pain with HEP.  She has been doing doing  Reports she had an injury back in October and she has been in a boot for several weeks now. Reports she has a partial achilles tear. Reports she used to do a lpot of walking and she doesn't have her walk strike back. Still limited by pain.   PERTINENT HISTORY: Anxiety, cardiac pacemaker, bradycardia, cardiomyopathy, CHF, hypertension, LBBB, panic disorder, SA Node dysfunction, supraventricular premature beats, SVT, systolic heart failure  PAIN:  Are you having pain? No  PRECAUTIONS: None  WEIGHT BEARING RESTRICTIONS: Yes MD note indicated PT will help facilitate discontinuation of the walking boot.   FALLS:  Has patient fallen in last 6 months? No  OCCUPATION: [employer]   PLOF: Independent  PATIENT GOALS: Walking  NEXT MD VISIT:   OBJECTIVE: (objective measures from initial evaluation unless otherwise dated)  PATIENT SURVEYS:  LEFS  Extreme difficulty/unable (0), Quite a bit of difficulty (1), Moderate difficulty (2), Little difficulty (3), No difficulty (4) Survey date:  Eval  01/19/25  Any of your usual work, housework or school activities 4 4  2. Usual hobbies, recreational or sporting activities 2 3  3. Getting into/out of the bath 4 4  4. Walking between rooms 3 4  5. Putting on socks/shoes 3 4  6. Squatting  4 4  7. Lifting an object, like a bag of groceries from the floor  4 4  8. Performing light activities around your home 4 4  9. Performing heavy activities around your home 4 3  10. Getting into/out of a car 4 4  11. Walking 2 blocks 1 4  12. Walking 1 mile 1 3  13. Going up/down 10 stairs (1 flight) 3 4  14. Standing for 1 hour 4 4  15.  sitting for 1 hour 4 4  16. Running on even ground 1 3  17. Running on uneven ground 1 2  18. Making sharp turns while running fast 1 2  19. Hopping  1 2  20. Rolling over in bed 4 4  Score total:  56/80 70/80     COGNITION: Overall cognitive status: Within functional limits for tasks assessed     SENSATION: WFL  POSTURE: No Significant postural limitations  PALPATION: TTP to right achilles  LOWER EXTREMITY  MMT:  MMT Right eval Left eval Right 01/19/25 Left 01/19/25  Hip flexion 5 5    Hip extension   4+ 4+  Hip abduction (seated) 5 5 4  sidelying 4 sidelying  Hip adduction (seated)  5 5    Hip internal rotation      Hip external rotation      Knee flexion 5 5    Knee extension 5 5    Ankle dorsiflexion 5 5 5 5   Ankle plantarflexion Calf raise difficult      Ankle inversion   5 5  Ankle eversion   5 5   (Blank rows = not tested) *= pain/symptoms  LOWER EXTREMITY ROM:  Active ROM Right eval Left eval Right 01/19/25 Left 01/19/25  Hip flexion      Hip extension      Hip abduction      Hip adduction      Hip internal rotation      Hip external rotation      Knee flexion      Knee extension      Ankle dorsiflexion 4 9 10 10   Ankle plantarflexion 30 35 52 54  Ankle inversion 35 42 34 38  Ankle eversion 20 18 10 17    (Blank rows = not tested) *= pain/symptoms  FUNCTIONAL TESTS:  5 times sit to stand: 8.58 seconds  GAIT: Distance walked: 50 ft from lobby to treatment room, 50 ft in hallway  Assistive device utilized: None Level of assistance: Complete Independence Comments: ambulates with early heel off on right ankle, antalgic gait  Stairs: difficulty and pain in ankle when descending  stairs, early heel off, one step at a time but  able to reciprocally navigate with increased time and compensation   Reassessment 01/19/25 GAIT: Distance walked: 100 feet Assistive device utilized: None Level of assistance: Complete Independence Comments: mild trendelenberg on R hip Stairs: 7 inch, WFL, much improved  TODAY'S TREATMENT:                                                                                                                              DATE:  01/19/25 Reassessment Discussion of POC and review of HEP  01/05/25: IASTM and STM to R triceps surae (roller and hawk grips) prone position Standing gastroc stretch 30sec x4 Standing soleus stretch 30sec x4  Standing HR 2x10 Standing TR 2x10 Standing HR with hip ER 2x10 Standing HR with knees flexed 2x10 Standing hip flexor stretch foot on stool 2x30sec each Thomas stretch 30sec ea Gait in hall 2x142ft Standing hip extension 2x10ea HEP update/review  12/29/24: IASTM and STM to R triceps surae (roller and hawk grips) prone position Standing gastroc stretch 30sec x4 Standing soleus stretch 30sec x4  Standing HR 2x10 Standing TR 2x10 Standing HR with hip ER 2x10 Standing HR with knees flexed 2x10 Gait in hall x159ft HEP update/review   11/21/24: Reviewed current function, pain level, HEP compliance, and response to  prior treatment.  DLS: Symmetrical Wb'ing, No pain Gait:  Pt ambulated without boot.  Pt favors R LE though had no significant limp.  Pt has limited toe off.  Pt reports 3-4/10 with ambulation though pain is less when she walks slower.   Scifit recumbent bike x Ankle Eve and Inv with RTB 3x10 each Ankle PF with YTB 3x10 Ankle DF not past neutral with YTB x 10, RTB 2x10 Seated heel raises 2x10 Supine SLR with ankle relaxed 2x10 PT reviewed HEP  PT educated pt concerning healing and instructed pt to not perform any exercise into a range that causes her to feel a pulling or pain at the  achilles.    11/16/24  Evaluation  4 way ankle x10 Calf raises x10 Gait assessment and stair navigation   PATIENT EDUCATION:  Education details: Patient educated on exam findings, POC, scope of PT, HEP, relevant anatomy and biomechanics, and activity tolerance. 01/19/25: HEP, reassessment findings, POC Person educated: Patient Education method: Explanation, Demonstration, and Handouts Education comprehension: verbalized understanding, returned demonstration, verbal cues required, and tactile cues required  HOME EXERCISE PROGRAM: Access Code: [email or web address] URL: [email or web address] Date: 11/16/2024 Prepared by: Lili Finder  Exercises - Long Sitting Ankle Eversion with Resistance  - 1 x daily - 7 x weekly - 3 sets - 10 reps - Long Sitting Ankle Plantar Flexion with Resistance  - 1 x daily - 7 x weekly - 3 sets - 10 reps - Long Sitting Ankle Dorsiflexion with Anchored Resistance  - 1 x daily - 7 x weekly - 3 sets - 10 reps - Long Sitting Ankle Inversion with Resistance  - 1 x daily - 7 x weekly - 3 sets - 10 reps - Heel Raises with Counter Support  - 1 x daily - 7 x weekly - 3 sets - 10 reps  ASSESSMENT:  CLINICAL IMPRESSION: Patient has met 2/2 short term goals and 4/4 long term goals with ability to complete HEP and improvement in symptoms, strength, ROM, activity tolerance, gait, balance, and functional mobility. 1 goal partially met due to mild deficit in hip mechanics/strength. Educated on additional hip strengthening and returning to PT if needed. Patient discharged at this time.     OBJECTIVE IMPAIRMENTS: Abnormal gait, decreased activity tolerance, decreased balance, decreased endurance, decreased mobility, difficulty walking, decreased ROM, decreased strength, increased muscle spasms, impaired flexibility, improper body mechanics, and pain  ACTIVITY LIMITATIONS: lifting, bending, standing, squatting, stairs, transfers, locomotion level, and caring for  others  PARTICIPATION LIMITATIONS: meal prep, cleaning, laundry, shopping, community activity, occupation, and yard work  PERSONAL FACTORS: 3+ comorbidities: Anxiety, cardiac pacemaker, bradycardia, cardiomyopathy, CHF, hypertension, LBBB, panic disorder, SA Node dysfunction, supraventricular premature beats, SVT, systolic heart failure are also affecting patient's functional outcome.   REHAB POTENTIAL: Good  CLINICAL DECISION MAKING: Evolving/moderate complexity  EVALUATION COMPLEXITY: Moderate   GOALS: Goals reviewed with patient? Yes  SHORT TERM GOALS: Target date: 11/29/2024  Patient will be independent with HEP in order to improve functional outcomes. Baseline: Goal status: MET  2.  Patient will report at least 25% improvement in symptoms for improved quality of life. Baseline: Goal status: MET   LONG TERM GOALS: Target date: 01/11/2025  Patient will report at least 75% improvement in symptoms for improved quality of life. Baseline:  Goal status: MET  2.  Patient will improve LEFS score by at least 9 points in order to indicate improved tolerance to activity. Baseline:  Goal status: MET  3.  Patient will be  able to navigate stairs with reciprocal pattern without compensation in order to demonstrate improved LE strength. Baseline:  Goal status: MET  4.  Patient will ambulate 50 ft in hallway with reciprocal gait pattern and no compensation to indicate improvement in activity tolerance and functional mobility.  Baseline: 2/6 compensation at hip, ankle WFL Goal status: partially MET  PLAN:  PT FREQUENCY: 1x/week  PT DURATION: 8 weeks  PLANNED INTERVENTIONS: 97164- PT Re-evaluation, 97110-Therapeutic exercises, 97530- Therapeutic activity, 97112- Neuromuscular re-education, 97535- Self Care, [id number]- Manual therapy, [id number]- Gait training, [phone]- Orthotic Fit/training, [phone]- Canalith repositioning, [id number]- Aquatic Therapy, [phone]- Splinting, [phone]- Wound care (first 20  sq cm), 97598- Wound care (each additional 20 sq cm)Patient/Family education, Balance training, Stair training, Taping, Dry Needling, Joint mobilization, Joint manipulation, Spinal manipulation, Spinal mobilization, Scar mobilization, and DME instructions.  PLAN FOR NEXT SESSION:    Prentice GORMAN Stains, PT, DPT 01/19/2025, 8:40 AM   "

## 2025-03-26 ENCOUNTER — Encounter

## 2025-05-08 ENCOUNTER — Encounter: Admitting: Family Medicine

## 2025-05-09 ENCOUNTER — Encounter: Admitting: Family Medicine

## 2025-06-25 ENCOUNTER — Encounter
